# Patient Record
Sex: Male | Born: 1945 | Race: White | Hispanic: No | Smoking: Never smoker
Health system: Southern US, Community
[De-identification: ages and names within clinical notes are randomized; demographics above are authoritative.]

## PROBLEM LIST (undated history)

## (undated) DIAGNOSIS — I10 Essential (primary) hypertension: Secondary | ICD-10-CM

## (undated) DIAGNOSIS — I2510 Atherosclerotic heart disease of native coronary artery without angina pectoris: Secondary | ICD-10-CM

## (undated) DIAGNOSIS — M109 Gout, unspecified: Secondary | ICD-10-CM

## (undated) DIAGNOSIS — G629 Polyneuropathy, unspecified: Secondary | ICD-10-CM

## (undated) DIAGNOSIS — E785 Hyperlipidemia, unspecified: Secondary | ICD-10-CM

## (undated) HISTORY — PX: ABDOMINAL SURGERY: SHX537

## (undated) HISTORY — PX: CORONARY ARTERY BYPASS GRAFT: SHX141

## (undated) HISTORY — PX: AORTIC VALVE REPAIR: SHX6306

## (undated) HISTORY — PX: CARDIAC CATHETERIZATION: SHX172

---

## 2002-09-05 ENCOUNTER — Emergency Department (HOSPITAL_COMMUNITY): Admission: EM | Admit: 2002-09-05 | Discharge: 2002-09-05 | Payer: Self-pay | Admitting: Internal Medicine

## 2002-09-05 ENCOUNTER — Encounter: Payer: Self-pay | Admitting: Internal Medicine

## 2003-07-24 ENCOUNTER — Emergency Department (HOSPITAL_COMMUNITY): Admission: EM | Admit: 2003-07-24 | Discharge: 2003-07-24 | Payer: Self-pay | Admitting: Internal Medicine

## 2005-01-24 ENCOUNTER — Emergency Department (HOSPITAL_COMMUNITY): Admission: EM | Admit: 2005-01-24 | Discharge: 2005-01-24 | Payer: Self-pay | Admitting: Family Medicine

## 2005-07-25 ENCOUNTER — Encounter: Admission: RE | Admit: 2005-07-25 | Discharge: 2005-10-23 | Payer: Self-pay | Admitting: Orthopedic Surgery

## 2005-08-29 ENCOUNTER — Inpatient Hospital Stay (HOSPITAL_COMMUNITY): Admission: RE | Admit: 2005-08-29 | Discharge: 2005-09-04 | Payer: Self-pay | Admitting: Psychiatry

## 2005-08-30 ENCOUNTER — Ambulatory Visit: Payer: Self-pay | Admitting: Psychiatry

## 2005-09-03 ENCOUNTER — Encounter: Payer: Self-pay | Admitting: *Deleted

## 2005-09-04 ENCOUNTER — Inpatient Hospital Stay (HOSPITAL_COMMUNITY): Admission: EM | Admit: 2005-09-04 | Discharge: 2005-09-20 | Payer: Self-pay | Admitting: Emergency Medicine

## 2005-09-04 ENCOUNTER — Ambulatory Visit: Payer: Self-pay | Admitting: Physical Medicine & Rehabilitation

## 2012-12-19 ENCOUNTER — Encounter (HOSPITAL_COMMUNITY): Payer: Self-pay

## 2012-12-19 ENCOUNTER — Encounter (HOSPITAL_COMMUNITY)
Admission: RE | Admit: 2012-12-19 | Discharge: 2012-12-19 | Disposition: A | Discharge status: Home / Self Care | Payer: Medicare Other | Source: Ambulatory Visit | Attending: *Deleted | Admitting: *Deleted

## 2012-12-19 VITALS — BP 84/60 | HR 85 | Ht 72.0 in | Wt 153.8 lb

## 2012-12-19 DIAGNOSIS — Z954 Presence of other heart-valve replacement: Secondary | ICD-10-CM

## 2012-12-19 DIAGNOSIS — I2510 Atherosclerotic heart disease of native coronary artery without angina pectoris: Secondary | ICD-10-CM

## 2012-12-19 HISTORY — DX: Hyperlipidemia, unspecified: E78.5

## 2012-12-19 HISTORY — DX: Atherosclerotic heart disease of native coronary artery without angina pectoris: I25.10

## 2012-12-19 NOTE — Patient Instructions (Addendum)
Pt has finished orientation and is scheduled to start CR on 12/30/12 at 11 am. Pt has been instructed to arrive to class 15 minutes early for scheduled class. Pt has been instructed to wear comfortable clothing and shoes with rubber soles. Pt has been told to take their medications 1 hour prior to coming to class.  If the patient is not going to attend class, he/she has been instructed to call.

## 2012-12-19 NOTE — Progress Notes (Signed)
Patient was referred by Dr. Lynetta Mare post CABGx3 414.01 and Valve Repair V42.2 on 11/27/10. During orientation advised patient on arrival and appointment times what to wear, what to do before, during and after exercise. Reviewed attendance and class policy. Talked about inclement weather and class consultation policy. Pt is scheduled to start Cardiac Rehab on _4/7/14 at 11 am. Pt was advised to come to class 5 minutes before class starts. He was also given instructions on meeting with the dietician and attending the Family Structure classes. Pt is eager to get started. Patient was able to do the 6 minute pre walk test.

## 2012-12-30 ENCOUNTER — Encounter (HOSPITAL_COMMUNITY)
Admission: RE | Admit: 2012-12-30 | Discharge: 2012-12-30 | Disposition: A | Discharge status: Home / Self Care | Payer: Medicare Other | Source: Ambulatory Visit | Attending: Cardiovascular Disease | Admitting: Cardiovascular Disease

## 2012-12-30 DIAGNOSIS — Z951 Presence of aortocoronary bypass graft: Secondary | ICD-10-CM | POA: Insufficient documentation

## 2012-12-30 DIAGNOSIS — Z5189 Encounter for other specified aftercare: Secondary | ICD-10-CM | POA: Insufficient documentation

## 2012-12-30 DIAGNOSIS — I2510 Atherosclerotic heart disease of native coronary artery without angina pectoris: Secondary | ICD-10-CM | POA: Insufficient documentation

## 2012-12-30 DIAGNOSIS — E785 Hyperlipidemia, unspecified: Secondary | ICD-10-CM | POA: Insufficient documentation

## 2013-01-01 ENCOUNTER — Encounter (HOSPITAL_COMMUNITY)
Admission: RE | Admit: 2013-01-01 | Discharge: 2013-01-01 | Disposition: A | Discharge status: Home / Self Care | Payer: Medicare Other | Source: Ambulatory Visit | Attending: *Deleted | Admitting: *Deleted

## 2013-01-03 ENCOUNTER — Encounter (HOSPITAL_COMMUNITY)
Admission: RE | Admit: 2013-01-03 | Discharge: 2013-01-03 | Disposition: A | Discharge status: Home / Self Care | Payer: Medicare Other | Source: Ambulatory Visit | Attending: *Deleted | Admitting: *Deleted

## 2013-01-06 ENCOUNTER — Encounter (HOSPITAL_COMMUNITY)
Admission: RE | Admit: 2013-01-06 | Discharge: 2013-01-06 | Disposition: A | Discharge status: Home / Self Care | Payer: Medicare Other | Source: Ambulatory Visit | Attending: *Deleted | Admitting: *Deleted

## 2013-01-08 ENCOUNTER — Encounter (HOSPITAL_COMMUNITY)
Admission: RE | Admit: 2013-01-08 | Discharge: 2013-01-08 | Disposition: A | Discharge status: Home / Self Care | Payer: Medicare Other | Source: Ambulatory Visit | Attending: *Deleted | Admitting: *Deleted

## 2013-01-08 ENCOUNTER — Ambulatory Visit: Payer: Self-pay | Admitting: Podiatry

## 2013-01-10 ENCOUNTER — Encounter (HOSPITAL_COMMUNITY)
Admission: RE | Admit: 2013-01-10 | Discharge: 2013-01-10 | Disposition: A | Discharge status: Home / Self Care | Payer: Medicare Other | Source: Ambulatory Visit | Attending: *Deleted | Admitting: *Deleted

## 2013-01-13 ENCOUNTER — Encounter (HOSPITAL_COMMUNITY)
Admission: RE | Admit: 2013-01-13 | Discharge: 2013-01-13 | Disposition: A | Discharge status: Home / Self Care | Payer: Medicare Other | Source: Ambulatory Visit | Attending: *Deleted | Admitting: *Deleted

## 2013-01-15 ENCOUNTER — Encounter (HOSPITAL_COMMUNITY)
Admission: RE | Admit: 2013-01-15 | Discharge: 2013-01-15 | Disposition: A | Discharge status: Home / Self Care | Payer: Medicare Other | Source: Ambulatory Visit | Attending: *Deleted | Admitting: *Deleted

## 2013-01-17 ENCOUNTER — Encounter (HOSPITAL_COMMUNITY)
Admission: RE | Admit: 2013-01-17 | Discharge: 2013-01-17 | Disposition: A | Discharge status: Home / Self Care | Payer: Medicare Other | Source: Ambulatory Visit | Attending: *Deleted | Admitting: *Deleted

## 2013-01-20 ENCOUNTER — Encounter (HOSPITAL_COMMUNITY)
Admission: RE | Admit: 2013-01-20 | Discharge: 2013-01-20 | Disposition: A | Discharge status: Home / Self Care | Payer: Medicare Other | Source: Ambulatory Visit | Attending: *Deleted | Admitting: *Deleted

## 2013-01-22 ENCOUNTER — Encounter (HOSPITAL_COMMUNITY): Payer: Medicare Other

## 2013-01-24 ENCOUNTER — Encounter (HOSPITAL_COMMUNITY)
Admission: RE | Admit: 2013-01-24 | Discharge: 2013-01-24 | Disposition: A | Discharge status: Home / Self Care | Payer: Medicare Other | Source: Ambulatory Visit | Attending: Cardiovascular Disease | Admitting: Cardiovascular Disease

## 2013-01-24 DIAGNOSIS — Z5189 Encounter for other specified aftercare: Secondary | ICD-10-CM | POA: Insufficient documentation

## 2013-01-24 DIAGNOSIS — E785 Hyperlipidemia, unspecified: Secondary | ICD-10-CM | POA: Insufficient documentation

## 2013-01-24 DIAGNOSIS — I2510 Atherosclerotic heart disease of native coronary artery without angina pectoris: Secondary | ICD-10-CM | POA: Insufficient documentation

## 2013-01-24 DIAGNOSIS — Z951 Presence of aortocoronary bypass graft: Secondary | ICD-10-CM | POA: Insufficient documentation

## 2013-01-27 ENCOUNTER — Encounter (HOSPITAL_COMMUNITY)
Admission: RE | Admit: 2013-01-27 | Discharge: 2013-01-27 | Disposition: A | Discharge status: Home / Self Care | Payer: Medicare Other | Source: Ambulatory Visit | Attending: *Deleted | Admitting: *Deleted

## 2013-01-28 ENCOUNTER — Ambulatory Visit (INDEPENDENT_AMBULATORY_CARE_PROVIDER_SITE_OTHER): Payer: Medicare Other | Admitting: Podiatry

## 2013-01-28 ENCOUNTER — Encounter: Payer: Self-pay | Admitting: Podiatry

## 2013-01-28 VITALS — BP 127/69 | HR 66 | Ht 70.0 in | Wt 153.0 lb

## 2013-01-28 DIAGNOSIS — B351 Tinea unguium: Secondary | ICD-10-CM

## 2013-01-28 DIAGNOSIS — M25579 Pain in unspecified ankle and joints of unspecified foot: Secondary | ICD-10-CM | POA: Insufficient documentation

## 2013-01-28 NOTE — Progress Notes (Signed)
Subjective: 67 y.o. year old male patient presents complaining of painful nails and 3rd toe right from corn.  Patient requests toe nails, corns and calluses trimmed. Stated that he has had triple bypass in March 2014.   Review of Systems - General ROS: negative for - chills, fatigue, fever, hot flashes, night sweats, sleep disturbance, weight gain or weight loss Ophthalmic ROS: negative ENT ROS: negative Allergy and Immunology ROS: negative Hematological and Lymphatic ROS: negative Respiratory ROS: no cough, shortness of breath, or wheezing Cardiovascular ROS: Had open heart surgery 11/23/12, tripple bypass. Gastrointestinal ROS: no abdominal pain, change in bowel habits, or black or bloody stools Genito-Urinary ROS: no dysuria, trouble voiding, or hematuria Musculoskeletal ROS: negative  Objective: Dermatologic: Thick yellow deformed nails x 10.  Distal clavi 3rd right painful. Vascular: Pedal pulses are all palpable. Orthopedic: Contracted lesser digits 3rd bilateral. Neurologic: Decreased sensory perception on Monofilament testing. All other epicritic and tactile sensations grossly intact including Vibratory sensations.  Assessment: Dystrophic mycotic nails x 10. Digital corn 3rd right.  Treatment: All mycotic nails, corns, calluses debrided.  Return in 3 months or as needed.

## 2013-01-29 ENCOUNTER — Encounter (HOSPITAL_COMMUNITY)
Admission: RE | Admit: 2013-01-29 | Discharge: 2013-01-29 | Disposition: A | Discharge status: Home / Self Care | Payer: Medicare Other | Source: Ambulatory Visit | Attending: *Deleted | Admitting: *Deleted

## 2013-01-31 ENCOUNTER — Encounter (HOSPITAL_COMMUNITY)
Admission: RE | Admit: 2013-01-31 | Discharge: 2013-01-31 | Disposition: A | Discharge status: Home / Self Care | Payer: Medicare Other | Source: Ambulatory Visit | Attending: *Deleted | Admitting: *Deleted

## 2013-02-03 ENCOUNTER — Encounter (HOSPITAL_COMMUNITY)
Admission: RE | Admit: 2013-02-03 | Discharge: 2013-02-03 | Disposition: A | Discharge status: Home / Self Care | Payer: Medicare Other | Source: Ambulatory Visit | Attending: *Deleted | Admitting: *Deleted

## 2013-02-05 ENCOUNTER — Encounter (HOSPITAL_COMMUNITY)
Admission: RE | Admit: 2013-02-05 | Discharge: 2013-02-05 | Disposition: A | Discharge status: Home / Self Care | Payer: Medicare Other | Source: Ambulatory Visit | Attending: *Deleted | Admitting: *Deleted

## 2013-02-07 ENCOUNTER — Encounter (HOSPITAL_COMMUNITY)
Admission: RE | Admit: 2013-02-07 | Discharge: 2013-02-07 | Disposition: A | Discharge status: Home / Self Care | Payer: Medicare Other | Source: Ambulatory Visit | Attending: *Deleted | Admitting: *Deleted

## 2013-02-10 ENCOUNTER — Encounter (HOSPITAL_COMMUNITY)
Admission: RE | Admit: 2013-02-10 | Discharge: 2013-02-10 | Disposition: A | Discharge status: Home / Self Care | Payer: Medicare Other | Source: Ambulatory Visit | Attending: *Deleted | Admitting: *Deleted

## 2013-02-12 ENCOUNTER — Encounter (HOSPITAL_COMMUNITY)
Admission: RE | Admit: 2013-02-12 | Discharge: 2013-02-12 | Disposition: A | Discharge status: Home / Self Care | Payer: Medicare Other | Source: Ambulatory Visit | Attending: *Deleted | Admitting: *Deleted

## 2013-02-14 ENCOUNTER — Encounter (HOSPITAL_COMMUNITY)
Admission: RE | Admit: 2013-02-14 | Discharge: 2013-02-14 | Disposition: A | Discharge status: Home / Self Care | Payer: Medicare Other | Source: Ambulatory Visit | Attending: *Deleted | Admitting: *Deleted

## 2013-02-17 ENCOUNTER — Encounter (HOSPITAL_COMMUNITY): Payer: Medicare Other

## 2013-02-19 ENCOUNTER — Encounter (HOSPITAL_COMMUNITY)
Admission: RE | Admit: 2013-02-19 | Discharge: 2013-02-19 | Disposition: A | Discharge status: Home / Self Care | Payer: Medicare Other | Source: Ambulatory Visit | Attending: *Deleted | Admitting: *Deleted

## 2013-02-21 ENCOUNTER — Encounter (HOSPITAL_COMMUNITY)
Admission: RE | Admit: 2013-02-21 | Discharge: 2013-02-21 | Disposition: A | Discharge status: Home / Self Care | Payer: Medicare Other | Source: Ambulatory Visit | Attending: *Deleted | Admitting: *Deleted

## 2013-02-24 ENCOUNTER — Encounter (HOSPITAL_COMMUNITY)
Admission: RE | Admit: 2013-02-24 | Discharge: 2013-02-24 | Disposition: A | Discharge status: Home / Self Care | Payer: Medicare Other | Source: Ambulatory Visit | Attending: Cardiovascular Disease | Admitting: Cardiovascular Disease

## 2013-02-24 DIAGNOSIS — Z5189 Encounter for other specified aftercare: Secondary | ICD-10-CM | POA: Insufficient documentation

## 2013-02-24 DIAGNOSIS — E785 Hyperlipidemia, unspecified: Secondary | ICD-10-CM | POA: Insufficient documentation

## 2013-02-24 DIAGNOSIS — I2510 Atherosclerotic heart disease of native coronary artery without angina pectoris: Secondary | ICD-10-CM | POA: Insufficient documentation

## 2013-02-24 DIAGNOSIS — Z951 Presence of aortocoronary bypass graft: Secondary | ICD-10-CM | POA: Insufficient documentation

## 2013-02-26 ENCOUNTER — Encounter (HOSPITAL_COMMUNITY)
Admission: RE | Admit: 2013-02-26 | Discharge: 2013-02-26 | Disposition: A | Discharge status: Home / Self Care | Payer: Medicare Other | Source: Ambulatory Visit | Attending: *Deleted | Admitting: *Deleted

## 2013-02-28 ENCOUNTER — Encounter (HOSPITAL_COMMUNITY): Payer: Medicare Other

## 2013-03-03 ENCOUNTER — Encounter (HOSPITAL_COMMUNITY)
Admission: RE | Admit: 2013-03-03 | Discharge: 2013-03-03 | Disposition: A | Discharge status: Home / Self Care | Payer: Medicare Other | Source: Ambulatory Visit | Attending: *Deleted | Admitting: *Deleted

## 2013-03-05 ENCOUNTER — Encounter (HOSPITAL_COMMUNITY)
Admission: RE | Admit: 2013-03-05 | Discharge: 2013-03-05 | Disposition: A | Discharge status: Home / Self Care | Payer: Medicare Other | Source: Ambulatory Visit | Attending: *Deleted | Admitting: *Deleted

## 2013-03-07 ENCOUNTER — Encounter (HOSPITAL_COMMUNITY)
Admission: RE | Admit: 2013-03-07 | Discharge: 2013-03-07 | Disposition: A | Discharge status: Home / Self Care | Payer: Medicare Other | Source: Ambulatory Visit | Attending: *Deleted | Admitting: *Deleted

## 2013-03-10 ENCOUNTER — Encounter (HOSPITAL_COMMUNITY)
Admission: RE | Admit: 2013-03-10 | Discharge: 2013-03-10 | Disposition: A | Discharge status: Home / Self Care | Payer: Medicare Other | Source: Ambulatory Visit | Attending: *Deleted | Admitting: *Deleted

## 2013-03-12 ENCOUNTER — Encounter (HOSPITAL_COMMUNITY)
Admission: RE | Admit: 2013-03-12 | Discharge: 2013-03-12 | Disposition: A | Discharge status: Home / Self Care | Payer: Medicare Other | Source: Ambulatory Visit | Attending: *Deleted | Admitting: *Deleted

## 2013-03-14 ENCOUNTER — Encounter (HOSPITAL_COMMUNITY)
Admission: RE | Admit: 2013-03-14 | Discharge: 2013-03-14 | Disposition: A | Discharge status: Home / Self Care | Payer: Medicare Other | Source: Ambulatory Visit | Attending: *Deleted | Admitting: *Deleted

## 2013-03-17 ENCOUNTER — Encounter (HOSPITAL_COMMUNITY)
Admission: RE | Admit: 2013-03-17 | Discharge: 2013-03-17 | Disposition: A | Discharge status: Home / Self Care | Payer: Medicare Other | Source: Ambulatory Visit | Attending: *Deleted | Admitting: *Deleted

## 2013-03-19 ENCOUNTER — Encounter (HOSPITAL_COMMUNITY)
Admission: RE | Admit: 2013-03-19 | Discharge: 2013-03-19 | Disposition: A | Discharge status: Home / Self Care | Payer: Medicare Other | Source: Ambulatory Visit | Attending: *Deleted | Admitting: *Deleted

## 2013-03-21 ENCOUNTER — Encounter (HOSPITAL_COMMUNITY)
Admission: RE | Admit: 2013-03-21 | Discharge: 2013-03-21 | Disposition: A | Discharge status: Home / Self Care | Payer: Medicare Other | Source: Ambulatory Visit | Attending: *Deleted | Admitting: *Deleted

## 2013-03-24 ENCOUNTER — Encounter (HOSPITAL_COMMUNITY)
Admission: RE | Admit: 2013-03-24 | Discharge: 2013-03-24 | Disposition: A | Discharge status: Home / Self Care | Payer: Medicare Other | Source: Ambulatory Visit | Attending: *Deleted | Admitting: *Deleted

## 2013-03-26 ENCOUNTER — Encounter (HOSPITAL_COMMUNITY)
Admission: RE | Admit: 2013-03-26 | Discharge: 2013-03-26 | Disposition: A | Discharge status: Home / Self Care | Payer: Medicare Other | Source: Ambulatory Visit | Attending: Cardiovascular Disease | Admitting: Cardiovascular Disease

## 2013-03-26 DIAGNOSIS — I2510 Atherosclerotic heart disease of native coronary artery without angina pectoris: Secondary | ICD-10-CM | POA: Insufficient documentation

## 2013-03-26 DIAGNOSIS — Z951 Presence of aortocoronary bypass graft: Secondary | ICD-10-CM | POA: Insufficient documentation

## 2013-03-26 DIAGNOSIS — Z5189 Encounter for other specified aftercare: Secondary | ICD-10-CM | POA: Insufficient documentation

## 2013-03-31 ENCOUNTER — Encounter (HOSPITAL_COMMUNITY)
Admission: RE | Admit: 2013-03-31 | Discharge: 2013-03-31 | Disposition: A | Discharge status: Home / Self Care | Payer: Medicare Other | Source: Ambulatory Visit | Attending: Cardiovascular Disease | Admitting: Cardiovascular Disease

## 2013-04-30 ENCOUNTER — Encounter: Payer: Self-pay | Admitting: Podiatry

## 2013-04-30 ENCOUNTER — Ambulatory Visit (INDEPENDENT_AMBULATORY_CARE_PROVIDER_SITE_OTHER): Payer: Medicare Other | Admitting: Podiatry

## 2013-04-30 VITALS — BP 114/71 | HR 55

## 2013-04-30 DIAGNOSIS — B351 Tinea unguium: Secondary | ICD-10-CM

## 2013-04-30 DIAGNOSIS — M25571 Pain in right ankle and joints of right foot: Secondary | ICD-10-CM

## 2013-04-30 DIAGNOSIS — M25579 Pain in unspecified ankle and joints of unspecified foot: Secondary | ICD-10-CM

## 2013-04-30 NOTE — Progress Notes (Signed)
Subjective:  67 y.o. year old male patient presents complaining of painful toe on 2nd digit right foot with a corn.   Patient requests toe nails, corns and calluses trimmed.  Objective: Dermatologic: Thick yellow deformed nails x 10.  Distal clavi 3r2nd right painful.  Vascular: Pedal pulses are all palpable.  Orthopedic: Contracted lesser digits 3rd bilateral.  Neurologic: Decreased sensory perception on Monofilament testing.  All other epicritic and tactile sensations grossly intact including Vibratory sensations.  Assessment:  Dystrophic mycotic nails x 10.  Painful digital corn 2nd right.  Treatment: All mycotic nails, corns, calluses debrided.  Return in 3 months or as needed

## 2013-08-01 ENCOUNTER — Encounter: Payer: Self-pay | Admitting: Podiatry

## 2013-08-01 ENCOUNTER — Ambulatory Visit (INDEPENDENT_AMBULATORY_CARE_PROVIDER_SITE_OTHER): Payer: Medicare Other | Admitting: Podiatry

## 2013-08-01 VITALS — BP 115/74 | HR 59 | Ht 68.0 in | Wt 150.0 lb

## 2013-08-01 DIAGNOSIS — M25579 Pain in unspecified ankle and joints of unspecified foot: Secondary | ICD-10-CM

## 2013-08-01 DIAGNOSIS — M2040 Other hammer toe(s) (acquired), unspecified foot: Secondary | ICD-10-CM | POA: Insufficient documentation

## 2013-08-01 DIAGNOSIS — B351 Tinea unguium: Secondary | ICD-10-CM

## 2013-08-01 NOTE — Progress Notes (Signed)
Subjective:  67 y.o. year old male patient presents complaining of painful toe on 2nd digit right foot with a corn.    Objective: Dermatologic: Thick yellow deformed nails x 10.  Distal clavi 2nd right painful.  Vascular: Pedal pulses are all palpable.  Orthopedic: Contracted lesser digits 3rd bilateral.  Neurologic: Decreased sensory perception on Monofilament testing.  All other epicritic and tactile sensations grossly intact including Vibratory sensations.   Assessment:  Dystrophic mycotic nails x 10.  Painful digital corn 2nd right.   Treatment: All mycotic nails, corns, calluses debrided.  Return in 3 months or as needed

## 2013-08-01 NOTE — Patient Instructions (Signed)
Seen for painful toe 2nd left. Has corn at distal end. All nails and corns debrided. Return as needed.

## 2013-08-05 NOTE — Addendum Note (Signed)
Encounter addended by: Angelica Pou, RN on: 08/05/2013  1:21 PM<BR>     Documentation filed: Notes Section

## 2013-08-05 NOTE — Addendum Note (Signed)
Encounter addended by: Angelica Pou, RN on: 08/05/2013 12:05 PM<BR>     Documentation filed: Notes Section

## 2013-08-05 NOTE — Progress Notes (Signed)
Cardiac Rehabilitation Program Outcomes Report   Orientation:  12/19/2012 Graduate Date:  07/072014 Discharge Date:  07/072014 # of sessions completed: 34 DX: CABG X 3 and Valve repair  Cardiologist: Cleavenger Family MD:  Saunders Revel Time:  11:00  A.  Exercise Program:  Tolerates exercise @ 3.73 METS for 15 minutes and Walk Test Results:  Post: Post Walk Test: Resting HR 66, BP 118/58, O2 97%, RPE 6, and RPD 6. 6 min HR 87, BP 130/70, O2 97% ,RPE 11 and RPD 7. POst HR 78, BP 108/62, O2 98%, RPE 6 and RPD 6. walked 1071ft at 2.0 mph at 2.8 METS.  B.  Mental Health:  Good mental attitude  C.  Education/Instruction/Skills  Accurately checks own pulse.  Rest:  66  Exercise: 119, Knows THR for exercise, Uses Perceived Exertion Scale and/or Dyspnea Scale and Attended 13 education classes  Uses Perceived Exertion Scale and/or Dyspnea Scale  D.  Nutrition/Weight Control/Body Composition:  Adherence to prescribed nutrition program: good    E.  Blood Lipids    No results found for this basename: CHOL, HDL, LDLCALC, LDLDIRECT, TRIG, CHOLHDL    F.  Lifestyle Changes:  Making positive lifestyle changes  G.  Symptoms noted with exercise:  Asymptomatic  Report Completed By:  Lelon Huh. Niah Heinle RN   Comments: This is patients Graduation's report. He achieved a peak METS of 3.73. His resting HR was 66 and BP of 118/58. His peak HR was 119 and BP was 150/68. Will contact patient at 59month, 6 months and 1 year to see if they are compliant with a exercise regimen.

## 2013-08-05 NOTE — Addendum Note (Signed)
Encounter addended by: Angelica Pou, RN on: 08/05/2013 12:06 PM<BR>     Documentation filed: Clinical Notes

## 2013-08-05 NOTE — Addendum Note (Signed)
Encounter addended by: Angelica Pou, RN on: 08/05/2013  1:22 PM<BR>     Documentation filed: Clinical Notes

## 2013-08-05 NOTE — Progress Notes (Signed)
Cardiac Rehabilitation Program Outcomes Report   Orientation:  12/19/2012 Halfway report 02/10/2013 Graduate Date:  tbd Discharge Date:  tbd # of sessions completed: 18 DX: CABG X 3/ Valve repair  Cardiologist: Molly Maduro Family MD:  Virgina Organ.ayyaz Class Time:  11:00  A.  Exercise Program:  Tolerates exercise @ 3.00 METS for 15 minutes  B.  Mental Health:  Good mental attitude and Quality of Life (QOL)  improvements:  Overall  20.15 %, Health/Functioning 17.33 %, Socioeconomics 21.29 %, Psych/Spiritual 22.29 %, Family 24.00 %    C.  Education/Instruction/Skills  Accurately checks own pulse.  Rest:  58  Exercise:  116, Knows THR for exercise and Uses Perceived Exertion Scale and/or Dyspnea Scale  Uses Perceived Exertion Scale and/or Dyspnea Scale  D.  Nutrition/Weight Control/Body Composition:  Adherence to prescribed nutrition program: good    E.  Blood Lipids    No results found for this basename: CHOL, HDL, LDLCALC, LDLDIRECT, TRIG, CHOLHDL    F.  Lifestyle Changes:  Making positive lifestyle changes  G.  Symptoms noted with exercise:  Asymptomatic  Report Completed By:  Lelon Huh. Dragon Thrush RN   Comments:  This is patients halfway report. He achieved a peak METS of 3.00. His resting HR was 53 and resting BP was 90/48 and his peak HR was 116 and Peak BP was 140/62. He has done well with rehab so far. A graduation report will follow upon his 36 visit.

## 2013-08-05 NOTE — Addendum Note (Signed)
Encounter addended by: Angelica Pou, RN on: 08/05/2013 12:19 PM<BR>     Documentation filed: Clinical Notes

## 2013-08-05 NOTE — Progress Notes (Signed)
Cardiac Rehabilitation Program Outcomes Report   Orientation:  12/19/2012 1st week report: 01/03/2013 Graduate Date:  tbd Discharge Date:  tbd # of sessions completed: 3 DX CABG X3/ Valve repair  Cardiologist: Cleavenger Family MD:  Saunders Revel Time:  11:00  A.  Exercise Program:  Tolerates exercise @ 3.72 METS for 15 minutes and Walk Test Results:  Pre: Pre walk Test: Resting Data: HR 71, BP 82/62, O2 96%, RPE,6, and RPD 6, 6 min HR 78, BP 102/60, O2 99%, RPE 9, and RPD 8, Post HR 67, BP 92/60, O2 98%, RPE 6, and RPD 6. Walked 850 ft at 1.6 mph at 2.2 METS.  B.  Mental Health:  Good mental attitude and Quality of Life (QOL)  improvements:  Overall  20.15 %, Health/Functioning 17.33 %, Socioeconomics 21.29 %, Psych/Spiritual 22.29 %, Family 24.00 %    C.  Education/Instruction/Skills  Accurately checks own pulse.  Rest:  80  Exercise:  100, Knows THR for exercise and Uses Perceived Exertion Scale and/or Dyspnea Scale  Uses Perceived Exertion Scale and/or Dyspnea Scale  D.  Nutrition/Weight Control/Body Composition:  Adherence to prescribed nutrition program: good    E.  Blood Lipids    No results found for this basename: CHOL, HDL, LDLCALC, LDLDIRECT, TRIG, CHOLHDL    F.  Lifestyle Changes:  Making positive lifestyle changes  G.  Symptoms noted with exercise:  Asymptomatic  Report Completed By:  Lelon Huh. Kammie Scioli RN   Comments:  This is patients 1st week report. He achieved a peak METS of 3.72. His resting HR was 80 and resting BP was 98/60. His peak HR was 100 and BP peak was 120/70.A report will follow upon the patients 18 th visit Halfway point.

## 2013-08-05 NOTE — Addendum Note (Signed)
Encounter addended by: Angelica Pou, RN on: 08/05/2013 12:18 PM<BR>     Documentation filed: Notes Section

## 2013-11-03 ENCOUNTER — Ambulatory Visit: Payer: Medicare Other | Admitting: Podiatry

## 2013-11-11 ENCOUNTER — Ambulatory Visit: Payer: Medicare Other | Admitting: Podiatry

## 2013-11-14 ENCOUNTER — Encounter: Payer: Self-pay | Admitting: Podiatry

## 2013-11-14 ENCOUNTER — Ambulatory Visit (INDEPENDENT_AMBULATORY_CARE_PROVIDER_SITE_OTHER): Payer: Medicare Other | Admitting: Podiatry

## 2013-11-14 VITALS — BP 137/71 | HR 55 | Ht 68.0 in | Wt 158.0 lb

## 2013-11-14 DIAGNOSIS — M2040 Other hammer toe(s) (acquired), unspecified foot: Secondary | ICD-10-CM

## 2013-11-14 DIAGNOSIS — M79606 Pain in leg, unspecified: Secondary | ICD-10-CM

## 2013-11-14 DIAGNOSIS — B351 Tinea unguium: Secondary | ICD-10-CM

## 2013-11-14 DIAGNOSIS — M79609 Pain in unspecified limb: Secondary | ICD-10-CM

## 2013-11-14 NOTE — Progress Notes (Signed)
Subjective:  68 y.o. year old male patient presents complaining of pain on right foot and toe nails on both feet.   Objective: Dermatologic: Thick yellow deformed nails x 10.  Distal clavi 2nd and 4th digits right painful.  No visible lesions on plantar surface where patient indicated for pain on right foot.  Vascular: Pedal pulses are all palpable.  Orthopedic: Contracted lesser digits 3rd bilateral and 4th right. Neurologic: Decreased sensory perception on Monofilament testing.  All other epicritic and tactile sensations grossly intact including Vibratory sensations.   Assessment:  Dystrophic mycotic nails x 10.  Painful digital corn 2nd and 4th digits right.   Treatment: All mycotic nails, corns, calluses debrided.  Return in 3 months or as needed

## 2013-11-14 NOTE — Patient Instructions (Signed)
Seen for hypertrophic nails. All nails debrided. Return in 3 months or as needed.  

## 2014-01-16 ENCOUNTER — Ambulatory Visit (INDEPENDENT_AMBULATORY_CARE_PROVIDER_SITE_OTHER): Payer: Medicare Other | Admitting: Podiatry

## 2014-01-16 ENCOUNTER — Encounter: Payer: Self-pay | Admitting: Podiatry

## 2014-01-16 VITALS — BP 133/75 | HR 67

## 2014-01-16 DIAGNOSIS — M109 Gout, unspecified: Secondary | ICD-10-CM | POA: Insufficient documentation

## 2014-01-16 DIAGNOSIS — M79606 Pain in leg, unspecified: Secondary | ICD-10-CM

## 2014-01-16 DIAGNOSIS — R609 Edema, unspecified: Secondary | ICD-10-CM

## 2014-01-16 DIAGNOSIS — M79609 Pain in unspecified limb: Secondary | ICD-10-CM

## 2014-01-16 MED ORDER — HYDROCODONE-ACETAMINOPHEN 10-325 MG PO TABS: 1.0000 | ORAL_TABLET | Freq: Four times a day (QID) | ORAL | Status: DC | PRN

## 2014-01-16 MED ORDER — INDOMETHACIN 50 MG PO CAPS: 50.0000 mg | ORAL_CAPSULE | Freq: Three times a day (TID) | ORAL | Status: DC

## 2014-01-16 NOTE — Patient Instructions (Signed)
Seen for pain and swelling on right foot. Possible Gout. Take medication as prescribed and return in one week or sooner if pain and swelling, or redness increases.

## 2014-01-16 NOTE — Addendum Note (Signed)
Addended by: Charlett NoseSHEARD, Datra Clary O on: 01/16/2014 02:29 PM   Modules accepted: Orders

## 2014-01-16 NOTE — Progress Notes (Signed)
Subjective: 68 year old male presents accompanied by his wife with pain in right foot. It has gotten swollen, warm, and painful to walk since yesterday.  Patient denies any injury or trauma associated with the pain. Stated that he has done drinking beer prior to this incident.   Objective: Warm increased temperature through out the right foot.  A few areas of mild erythematous joints at the first MPJ and some at mid foot joints.  Positive of mild forefoot edema noted. Hypertrophic and dystrophic nails x 10. All pedal pulses are all palpable.  Radiographic examination of the right foot reveal narrowed joint space at the first MPJ, severely displaced first ray to dorsal direction. No acute change in osseous or articular spaces. No broken bone or soft tissue mass noted.   Assessment: Rule out Gouty arthritis right foot.  Pain in right lower limb. Mycotic nails x 10.  Plan: Reviewed findings. Rx Indocin 50 mg q 8 hr till pain subside and Hydrocodone as needed for pain.

## 2014-01-21 ENCOUNTER — Ambulatory Visit: Payer: Medicare Other | Admitting: Podiatry

## 2014-01-23 ENCOUNTER — Ambulatory Visit (INDEPENDENT_AMBULATORY_CARE_PROVIDER_SITE_OTHER): Payer: Medicare Other | Admitting: Podiatry

## 2014-01-23 ENCOUNTER — Encounter: Payer: Self-pay | Admitting: Podiatry

## 2014-01-23 VITALS — BP 175/81 | HR 52

## 2014-01-23 DIAGNOSIS — M79609 Pain in unspecified limb: Secondary | ICD-10-CM

## 2014-01-23 DIAGNOSIS — M109 Gout, unspecified: Secondary | ICD-10-CM

## 2014-01-23 DIAGNOSIS — M79606 Pain in leg, unspecified: Secondary | ICD-10-CM

## 2014-01-23 NOTE — Progress Notes (Signed)
Follow up on gouty arthritis right foot. Stated that the after taking medication he was able to walk without discomfort the next day.  Now only hurts from corn on 4th toe right.   Objective: No edema or erythema on right.  No discomfort to walk except pain from a corn on 4th digit right.   Assessment: Improved Gouty arthropathy right foot. Painful corn 4th right with contracted digits.  Plan: Debrided painful corn. Return as needed. Discontinue Indocin tomorrow.

## 2014-01-23 NOTE — Patient Instructions (Signed)
Follow up on Gouty arthritis right foot. Medication has helped. Discontinue medication tomorrow.  Trimmed painful corn on 4th digit right. [facility].

## 2014-02-11 ENCOUNTER — Ambulatory Visit: Payer: Medicare Other | Admitting: Podiatry

## 2014-02-17 ENCOUNTER — Encounter: Payer: Self-pay | Admitting: Podiatry

## 2014-02-17 ENCOUNTER — Ambulatory Visit (INDEPENDENT_AMBULATORY_CARE_PROVIDER_SITE_OTHER): Payer: Medicare Other | Admitting: Podiatry

## 2014-02-17 VITALS — BP 128/67 | HR 63

## 2014-02-17 DIAGNOSIS — M79609 Pain in unspecified limb: Secondary | ICD-10-CM

## 2014-02-17 DIAGNOSIS — M2040 Other hammer toe(s) (acquired), unspecified foot: Secondary | ICD-10-CM

## 2014-02-17 DIAGNOSIS — B351 Tinea unguium: Secondary | ICD-10-CM

## 2014-02-17 DIAGNOSIS — M79606 Pain in leg, unspecified: Secondary | ICD-10-CM

## 2014-02-17 NOTE — Progress Notes (Signed)
Subjective:  68 y.o. year old male patient presents complaining of painful toe on 4th digit right foot with a corn at distal tip.   Patient requests toe nails, corns and calluses trimmed.  Objective: Dermatologic: Thick yellow deformed nails x 10.  Distal clavi 4th right painful.  Vascular: Pedal pulses are all palpable.  Orthopedic: Contracted lesser digits 3rd bilateral.  Neurologic: Decreased sensory perception on Monofilament testing.  All other epicritic and tactile sensations grossly intact including Vibratory sensations.   Assessment:  Dystrophic mycotic nails x 10.  Painful digital corn 4th right.   Treatment: All mycotic nails, corns, calluses debrided.  Return in 3 months or as needed

## 2014-02-17 NOTE — Patient Instructions (Signed)
Seen for painful corns and toe nails. All debrided. Return as needed.

## 2014-12-21 ENCOUNTER — Other Ambulatory Visit: Payer: Self-pay | Admitting: Podiatry

## 2017-06-21 ENCOUNTER — Encounter (INDEPENDENT_AMBULATORY_CARE_PROVIDER_SITE_OTHER): Payer: Self-pay | Admitting: Internal Medicine

## 2017-06-21 ENCOUNTER — Encounter (INDEPENDENT_AMBULATORY_CARE_PROVIDER_SITE_OTHER): Payer: Self-pay

## 2017-07-15 ENCOUNTER — Encounter (HOSPITAL_COMMUNITY): Payer: Self-pay

## 2017-07-15 ENCOUNTER — Inpatient Hospital Stay (HOSPITAL_COMMUNITY)
Admission: EM | Admit: 2017-07-15 | Discharge: 2017-07-25 | DRG: 682 | Disposition: A | Discharge status: Skilled Nursing Facility | Payer: Medicare Other | Attending: Internal Medicine | Admitting: Internal Medicine

## 2017-07-15 ENCOUNTER — Emergency Department (HOSPITAL_COMMUNITY): Payer: Medicare Other

## 2017-07-15 DIAGNOSIS — D649 Anemia, unspecified: Secondary | ICD-10-CM | POA: Diagnosis not present

## 2017-07-15 DIAGNOSIS — T502X5A Adverse effect of carbonic-anhydrase inhibitors, benzothiadiazides and other diuretics, initial encounter: Secondary | ICD-10-CM | POA: Diagnosis present

## 2017-07-15 DIAGNOSIS — I10 Essential (primary) hypertension: Secondary | ICD-10-CM

## 2017-07-15 DIAGNOSIS — L97919 Non-pressure chronic ulcer of unspecified part of right lower leg with unspecified severity: Secondary | ICD-10-CM | POA: Diagnosis present

## 2017-07-15 DIAGNOSIS — D6489 Other specified anemias: Secondary | ICD-10-CM | POA: Diagnosis present

## 2017-07-15 DIAGNOSIS — G629 Polyneuropathy, unspecified: Secondary | ICD-10-CM | POA: Diagnosis present

## 2017-07-15 DIAGNOSIS — Z951 Presence of aortocoronary bypass graft: Secondary | ICD-10-CM

## 2017-07-15 DIAGNOSIS — N179 Acute kidney failure, unspecified: Secondary | ICD-10-CM

## 2017-07-15 DIAGNOSIS — K7030 Alcoholic cirrhosis of liver without ascites: Secondary | ICD-10-CM | POA: Diagnosis present

## 2017-07-15 DIAGNOSIS — L97929 Non-pressure chronic ulcer of unspecified part of left lower leg with unspecified severity: Secondary | ICD-10-CM

## 2017-07-15 DIAGNOSIS — N170 Acute kidney failure with tubular necrosis: Principal | ICD-10-CM | POA: Diagnosis present

## 2017-07-15 DIAGNOSIS — G9340 Encephalopathy, unspecified: Secondary | ICD-10-CM

## 2017-07-15 DIAGNOSIS — D62 Acute posthemorrhagic anemia: Secondary | ICD-10-CM | POA: Diagnosis not present

## 2017-07-15 DIAGNOSIS — K651 Peritoneal abscess: Secondary | ICD-10-CM | POA: Diagnosis present

## 2017-07-15 DIAGNOSIS — I5022 Chronic systolic (congestive) heart failure: Secondary | ICD-10-CM | POA: Diagnosis present

## 2017-07-15 DIAGNOSIS — I739 Peripheral vascular disease, unspecified: Secondary | ICD-10-CM | POA: Diagnosis present

## 2017-07-15 DIAGNOSIS — Z720 Tobacco use: Secondary | ICD-10-CM

## 2017-07-15 DIAGNOSIS — F1021 Alcohol dependence, in remission: Secondary | ICD-10-CM | POA: Diagnosis not present

## 2017-07-15 DIAGNOSIS — Z7189 Other specified counseling: Secondary | ICD-10-CM | POA: Diagnosis not present

## 2017-07-15 DIAGNOSIS — E43 Unspecified severe protein-calorie malnutrition: Secondary | ICD-10-CM | POA: Diagnosis present

## 2017-07-15 DIAGNOSIS — N281 Cyst of kidney, acquired: Secondary | ICD-10-CM | POA: Diagnosis present

## 2017-07-15 DIAGNOSIS — M109 Gout, unspecified: Secondary | ICD-10-CM | POA: Diagnosis present

## 2017-07-15 DIAGNOSIS — E860 Dehydration: Secondary | ICD-10-CM | POA: Diagnosis present

## 2017-07-15 DIAGNOSIS — K262 Acute duodenal ulcer with both hemorrhage and perforation: Secondary | ICD-10-CM | POA: Diagnosis present

## 2017-07-15 DIAGNOSIS — E870 Hyperosmolality and hypernatremia: Secondary | ICD-10-CM | POA: Diagnosis not present

## 2017-07-15 DIAGNOSIS — Z6822 Body mass index (BMI) 22.0-22.9, adult: Secondary | ICD-10-CM | POA: Diagnosis not present

## 2017-07-15 DIAGNOSIS — E876 Hypokalemia: Secondary | ICD-10-CM | POA: Diagnosis not present

## 2017-07-15 DIAGNOSIS — K7031 Alcoholic cirrhosis of liver with ascites: Secondary | ICD-10-CM | POA: Diagnosis not present

## 2017-07-15 DIAGNOSIS — E7220 Disorder of urea cycle metabolism, unspecified: Secondary | ICD-10-CM | POA: Diagnosis not present

## 2017-07-15 DIAGNOSIS — E785 Hyperlipidemia, unspecified: Secondary | ICD-10-CM | POA: Diagnosis present

## 2017-07-15 DIAGNOSIS — I110 Hypertensive heart disease with heart failure: Secondary | ICD-10-CM | POA: Diagnosis present

## 2017-07-15 DIAGNOSIS — Z66 Do not resuscitate: Secondary | ICD-10-CM | POA: Diagnosis present

## 2017-07-15 DIAGNOSIS — Z8249 Family history of ischemic heart disease and other diseases of the circulatory system: Secondary | ICD-10-CM

## 2017-07-15 DIAGNOSIS — F1020 Alcohol dependence, uncomplicated: Secondary | ICD-10-CM | POA: Diagnosis present

## 2017-07-15 DIAGNOSIS — E875 Hyperkalemia: Secondary | ICD-10-CM | POA: Diagnosis present

## 2017-07-15 DIAGNOSIS — J90 Pleural effusion, not elsewhere classified: Secondary | ICD-10-CM

## 2017-07-15 DIAGNOSIS — R188 Other ascites: Secondary | ICD-10-CM | POA: Diagnosis not present

## 2017-07-15 DIAGNOSIS — R4182 Altered mental status, unspecified: Secondary | ICD-10-CM | POA: Diagnosis not present

## 2017-07-15 DIAGNOSIS — Z79899 Other long term (current) drug therapy: Secondary | ICD-10-CM

## 2017-07-15 DIAGNOSIS — Z7982 Long term (current) use of aspirin: Secondary | ICD-10-CM

## 2017-07-15 DIAGNOSIS — K266 Chronic or unspecified duodenal ulcer with both hemorrhage and perforation: Secondary | ICD-10-CM | POA: Diagnosis present

## 2017-07-15 DIAGNOSIS — G9349 Other encephalopathy: Secondary | ICD-10-CM | POA: Diagnosis not present

## 2017-07-15 DIAGNOSIS — K921 Melena: Secondary | ICD-10-CM | POA: Diagnosis not present

## 2017-07-15 DIAGNOSIS — M300 Polyarteritis nodosa: Secondary | ICD-10-CM | POA: Diagnosis present

## 2017-07-15 DIAGNOSIS — L0291 Cutaneous abscess, unspecified: Secondary | ICD-10-CM | POA: Diagnosis not present

## 2017-07-15 DIAGNOSIS — L97909 Non-pressure chronic ulcer of unspecified part of unspecified lower leg with unspecified severity: Secondary | ICD-10-CM

## 2017-07-15 DIAGNOSIS — I2510 Atherosclerotic heart disease of native coronary artery without angina pectoris: Secondary | ICD-10-CM | POA: Diagnosis present

## 2017-07-15 DIAGNOSIS — Z792 Long term (current) use of antibiotics: Secondary | ICD-10-CM

## 2017-07-15 DIAGNOSIS — Z515 Encounter for palliative care: Secondary | ICD-10-CM

## 2017-07-15 DIAGNOSIS — R0602 Shortness of breath: Secondary | ICD-10-CM

## 2017-07-15 DIAGNOSIS — K922 Gastrointestinal hemorrhage, unspecified: Secondary | ICD-10-CM | POA: Diagnosis not present

## 2017-07-15 DIAGNOSIS — R400 Somnolence: Secondary | ICD-10-CM | POA: Diagnosis not present

## 2017-07-15 HISTORY — DX: Polyneuropathy, unspecified: G62.9

## 2017-07-15 HISTORY — DX: Essential (primary) hypertension: I10

## 2017-07-15 HISTORY — DX: Gout, unspecified: M10.9

## 2017-07-15 LAB — COMPREHENSIVE METABOLIC PANEL
ALT: 17 U/L (ref 17–63)
AST: 22 U/L (ref 15–41)
Albumin: 2.2 g/dL — ABNORMAL LOW (ref 3.5–5.0)
Alkaline Phosphatase: 90 U/L (ref 38–126)
Anion gap: 13 (ref 5–15)
BILIRUBIN TOTAL: 0.5 mg/dL (ref 0.3–1.2)
BUN: 93 mg/dL — ABNORMAL HIGH (ref 6–20)
CO2: 15 mmol/L — ABNORMAL LOW (ref 22–32)
Calcium: 8.9 mg/dL (ref 8.9–10.3)
Chloride: 105 mmol/L (ref 101–111)
Creatinine, Ser: 3.9 mg/dL — ABNORMAL HIGH (ref 0.61–1.24)
GFR calc Af Amer: 16 mL/min — ABNORMAL LOW (ref >60)
GFR calc non Af Amer: 14 mL/min — ABNORMAL LOW (ref >60)
Glucose, Bld: 115 mg/dL — ABNORMAL HIGH (ref 65–99)
Potassium: 5.6 mmol/L — ABNORMAL HIGH (ref 3.5–5.1)
Sodium: 133 mmol/L — ABNORMAL LOW (ref 135–145)
Total Protein: 5.8 g/dL — ABNORMAL LOW (ref 6.5–8.1)

## 2017-07-15 LAB — CBC WITH DIFFERENTIAL/PLATELET
Basophils Absolute: 0 10*3/uL (ref 0.0–0.1)
Basophils Relative: 0 %
EOS ABS: 0 10*3/uL (ref 0.0–0.7)
EOS PCT: 0 %
HCT: 24.4 % — ABNORMAL LOW (ref 39.0–52.0)
Hemoglobin: 8.4 g/dL — ABNORMAL LOW (ref 13.0–17.0)
LYMPHS ABS: 1.6 10*3/uL (ref 0.7–4.0)
Lymphocytes Relative: 6 %
MCH: 31.5 pg (ref 26.0–34.0)
MCHC: 34.4 g/dL (ref 30.0–36.0)
MCV: 91.4 fL (ref 78.0–100.0)
MONOS PCT: 4 %
Monocytes Absolute: 1 10*3/uL (ref 0.1–1.0)
Neutro Abs: 23.7 10*3/uL — ABNORMAL HIGH (ref 1.7–7.7)
Neutrophils Relative %: 90 %
PLATELETS: 415 10*3/uL — AB (ref 150–400)
RBC: 2.67 MIL/uL — ABNORMAL LOW (ref 4.22–5.81)
RDW: 13.1 % (ref 11.5–15.5)
WBC: 26.2 10*3/uL — AB (ref 4.0–10.5)

## 2017-07-15 LAB — LIPASE, BLOOD: Lipase: 41 U/L (ref 11–51)

## 2017-07-15 LAB — URINALYSIS, ROUTINE W REFLEX MICROSCOPIC
Bilirubin Urine: NEGATIVE
Glucose, UA: NEGATIVE mg/dL
Ketones, ur: NEGATIVE mg/dL
Nitrite: NEGATIVE
PH: 5 (ref 5.0–8.0)
Protein, ur: NEGATIVE mg/dL
SPECIFIC GRAVITY, URINE: 1.011 (ref 1.005–1.030)

## 2017-07-15 LAB — SODIUM, URINE, RANDOM: SODIUM UR: 40 mmol/L

## 2017-07-15 LAB — TROPONIN I: Troponin I: <0.03 ng/mL (ref <0.03)

## 2017-07-15 LAB — CREATININE, URINE, RANDOM: CREATININE, URINE: 80.54 mg/dL

## 2017-07-15 LAB — ETHANOL: Alcohol, Ethyl (B): <10 mg/dL (ref <10)

## 2017-07-15 LAB — BRAIN NATRIURETIC PEPTIDE: B Natriuretic Peptide: 418 pg/mL — ABNORMAL HIGH (ref 0.0–100.0)

## 2017-07-15 MED ORDER — ONDANSETRON HCL 4 MG PO TABS: 4.0000 mg | ORAL_TABLET | Freq: Four times a day (QID) | ORAL | Status: DC | PRN

## 2017-07-15 MED ORDER — ALLOPURINOL 300 MG PO TABS
300.0000 mg | ORAL_TABLET | Freq: Every day | ORAL | Status: DC
  Administered 2017-07-16: 300 mg via ORAL
  Filled 2017-07-15: qty 1

## 2017-07-15 MED ORDER — ASPIRIN EC 81 MG PO TBEC
81.0000 mg | DELAYED_RELEASE_TABLET | Freq: Every day | ORAL | Status: DC
  Administered 2017-07-16 – 2017-07-22 (×3): 81 mg via ORAL
  Filled 2017-07-15 (×4): qty 1

## 2017-07-15 MED ORDER — PANTOPRAZOLE SODIUM 40 MG PO TBEC
40.0000 mg | DELAYED_RELEASE_TABLET | Freq: Every day | ORAL | Status: DC
  Administered 2017-07-16 – 2017-07-17 (×2): 40 mg via ORAL
  Filled 2017-07-15 (×2): qty 1

## 2017-07-15 MED ORDER — SODIUM CHLORIDE 0.9 % IV SOLN
INTRAVENOUS | Status: DC
  Administered 2017-07-15 – 2017-07-17 (×4): via INTRAVENOUS

## 2017-07-15 MED ORDER — HEPARIN SODIUM (PORCINE) 5000 UNIT/ML IJ SOLN
5000.0000 [IU] | Freq: Three times a day (TID) | INTRAMUSCULAR | Status: DC
  Administered 2017-07-16 – 2017-07-17 (×4): 5000 [IU] via SUBCUTANEOUS
  Filled 2017-07-15 (×4): qty 1

## 2017-07-15 MED ORDER — ACETAMINOPHEN 325 MG PO TABS
650.0000 mg | ORAL_TABLET | Freq: Four times a day (QID) | ORAL | Status: DC | PRN
  Administered 2017-07-25: 650 mg via ORAL
  Filled 2017-07-15: qty 2

## 2017-07-15 MED ORDER — ONDANSETRON HCL 4 MG/2ML IJ SOLN: 4.0000 mg | Freq: Four times a day (QID) | INTRAMUSCULAR | Status: DC | PRN

## 2017-07-15 MED ORDER — FERROUS SULFATE 325 (65 FE) MG PO TABS
325.0000 mg | ORAL_TABLET | Freq: Two times a day (BID) | ORAL | Status: DC
  Administered 2017-07-15 – 2017-07-17 (×5): 325 mg via ORAL
  Filled 2017-07-15 (×5): qty 1

## 2017-07-15 MED ORDER — FOLIC ACID 1 MG PO TABS
1.0000 mg | ORAL_TABLET | Freq: Once | ORAL | Status: AC
  Administered 2017-07-15: 1 mg via ORAL
  Filled 2017-07-15: qty 1

## 2017-07-15 MED ORDER — SODIUM CHLORIDE 0.9 % IV BOLUS (SEPSIS)
1000.0000 mL | Freq: Once | INTRAVENOUS | Status: AC
  Administered 2017-07-15: 1000 mL via INTRAVENOUS

## 2017-07-15 MED ORDER — METOPROLOL SUCCINATE ER 25 MG PO TB24
12.5000 mg | ORAL_TABLET | ORAL | Status: DC
  Administered 2017-07-16: 12.5 mg via ORAL
  Filled 2017-07-15: qty 1

## 2017-07-15 MED ORDER — VITAMIN D 1000 UNITS PO TABS
2000.0000 [IU] | ORAL_TABLET | Freq: Two times a day (BID) | ORAL | Status: DC
  Administered 2017-07-15 – 2017-07-17 (×5): 2000 [IU] via ORAL
  Filled 2017-07-15 (×5): qty 2

## 2017-07-15 MED ORDER — ACETAMINOPHEN 650 MG RE SUPP: 650.0000 mg | Freq: Four times a day (QID) | RECTAL | Status: DC | PRN

## 2017-07-15 MED ORDER — VITAMIN B-1 100 MG PO TABS
100.0000 mg | ORAL_TABLET | Freq: Once | ORAL | Status: AC
  Administered 2017-07-15: 100 mg via ORAL
  Filled 2017-07-15: qty 1

## 2017-07-15 NOTE — ED Provider Notes (Signed)
[facility] Provider Note   CSN: [id number] Arrival date & time: 07/15/17  1711     History   Chief Complaint Chief Complaint  Patient presents with  . Fatigue    HPI Jonathan Walker is a 71 y.o. male.  HPI Patient presents with his wife and daughter assist with the HPI. They note that over the past few days, possible week, the patient has had progressive weakness, decreased energy, decreased ability to perform typical activities of daily living. The patient himself acknowledges feeling weak, denies focal pain, denies confusion, denies dyspnea. Family members notes that the patient has chronic nonhealing wounds of both lower extremities as well. They do not report recent fever, chills per Acknowledge that he has multiple medical issues including congestive heart failure, denies a history of renal disease.  Eventually also note that the patient drinks alcohol daily,   Past Medical History:  Diagnosis Date  . Coronary artery disease   . Gout   . Hyperlipidemia   . Hypertension   . Neuropathy     Patient Active Problem List   Diagnosis Date Noted  . Gouty arthritis 01/23/2014  . Acute gouty arthritis 01/16/2014  . Pain in lower limb 11/14/2013  . Other hammer toe (acquired) 08/01/2013  . Onychomycosis due to dermatophyte 01/28/2013  . Pain in joint, ankle and foot 01/28/2013    Past Surgical History:  Procedure Laterality Date  . ABDOMINAL SURGERY    . AORTIC VALVE REPAIR    . CARDIAC CATHETERIZATION    . CORONARY ARTERY BYPASS GRAFT         Home Medications    Prior to Admission medications   Medication Sig Start Date End Date Taking? Authorizing Provider  allopurinol (ZYLOPRIM) 300 MG tablet Take 300 mg by mouth daily.   Yes [provider]  aspirin EC 81 MG tablet Take 81 mg by mouth daily.   Yes [provider]  cephALEXin (KEFLEX) 500 MG capsule Take 500 mg by mouth 3 (three) times daily.   Yes [provider]  Cholecalciferol (VITAMIN D3) 2000 units TABS Take 1 tablet by mouth 2 (two) times daily.   Yes [provider]  Ferrous Sulfate (IRON) 325 (65 Fe) MG TABS Take 1 tablet by mouth 2 (two) times daily.    Yes [provider]  furosemide (LASIX) 20 MG tablet Take 20 mg by mouth daily.    Yes [provider]  metoprolol succinate (TOPROL-XL) 25 MG 24 hr tablet Take 12.5 mg by mouth every other day.    Yes [provider]  omeprazole (PRILOSEC) 20 MG capsule Take 20 mg by mouth daily.   Yes [provider]  potassium chloride SA (K-DUR,KLOR-CON) 20 MEQ tablet Take 20 mEq by mouth daily.   Yes [provider]  PREDNISONE PO Take 1 tablet by mouth daily.   Yes [provider]    Family History Family History  Problem Relation Age of Onset  . Heart disease Brother     Social History Social History  Substance Use Topics  . Smoking status: Never Smoker  . Smokeless tobacco: Current User    Types: Chew  . Alcohol use Yes     Comment: 2 40 0z daily     Allergies   Patient has no known allergies.   Review of Systems Review of Systems  Constitutional:       Per HPI, otherwise negative  HENT:       Per  HPI, otherwise negative  Respiratory:       Per HPI, otherwise negative  Cardiovascular:       Per HPI, otherwise negative  Gastrointestinal: Negative for vomiting.  Endocrine:       Negative aside from HPI  Genitourinary:       Neg aside from HPI   Musculoskeletal:       Per HPI, otherwise negative  Skin: Positive for wound.  Neurological: Positive for weakness. Negative for syncope.     Physical Exam Updated Vital Signs BP (!) 142/80   Pulse 85   Temp 97.7 F (36.5 C) (Oral)   Resp 18   Wt 71.7 kg (158 lb)   SpO2 95%   BMI 24.02 kg/m   Physical Exam  Constitutional: He is oriented to person, place, and time. He has a sickly appearance. No distress.  HENT:  Head: Normocephalic and  atraumatic.  Eyes: Conjunctivae and EOM are normal.  Cardiovascular: Normal rate and regular rhythm.   Pulmonary/Chest: Effort normal. No stridor. No respiratory distress.  Abdominal: He exhibits no distension. There is no tenderness. There is no rigidity, no guarding, no tenderness at McBurney's point and negative Murphy's sign.  Musculoskeletal: He exhibits no edema.  Neurological: He is alert and oriented to person, place, and time. He displays atrophy. He displays no tremor. He displays no seizure activity.  Patient not ambulated secondary to weakness  Skin: Skin is warm and dry.     Psychiatric: He is slowed and withdrawn.  Nursing note and vitals reviewed.    ED Treatments / Results  Labs (all labs ordered are listed, but only abnormal results are displayed) Labs Reviewed  COMPREHENSIVE METABOLIC PANEL - Abnormal; Notable for the following:       Result Value   Sodium 133 (*)    Potassium 5.6 (*)    CO2 15 (*)    Glucose, Bld 115 (*)    BUN 93 (*)    Creatinine, Ser 3.90 (*)    Total Protein 5.8 (*)    Albumin 2.2 (*)    GFR calc non Af Amer 14 (*)    GFR calc Af Amer 16 (*)    All other components within normal limits  BRAIN NATRIURETIC PEPTIDE - Abnormal; Notable for the following:    B Natriuretic Peptide 418.0 (*)    All other components within normal limits  CBC WITH DIFFERENTIAL/PLATELET - Abnormal; Notable for the following:    WBC 26.2 (*)    RBC 2.67 (*)    Hemoglobin 8.4 (*)    HCT 24.4 (*)    Platelets 415 (*)    Neutro Abs 23.7 (*)    All other components within normal limits  URINALYSIS, ROUTINE W REFLEX MICROSCOPIC - Abnormal; Notable for the following:    APPearance HAZY (*)    Hgb urine dipstick LARGE (*)    Leukocytes, UA TRACE (*)    Bacteria, UA RARE (*)    Squamous Epithelial / LPF 0-5 (*)    All other components within normal limits  ETHANOL  LIPASE, BLOOD  TROPONIN I    EKG  EKG Interpretation  Date/Time:  Sunday July 15 2017  17:12:34 EDT Ventricular Rate:  83 PR Interval:    QRS Duration: 103 QT Interval:  387 QTC Calculation: 455 R Axis:   6 Text Interpretation:  Sinus rhythm Low voltage, precordial leads Artifact Otherwise within normal limits Confirmed by Gerhard Munch [phone]) on 07/15/2017 5:18:54 PM  Radiology Dg Chest 2 View  Result Date: 07/15/2017 CLINICAL DATA:  Subacute onset of generalized weakness and hypotension. Initial encounter. EXAM: CHEST  2 VIEW COMPARISON:  Chest radiograph performed 12/16/2012 FINDINGS: There is mild elevation of the right hemidiaphragm. Mild bilateral atelectasis is noted. No significant pleural effusion or pneumothorax is seen. The heart is mildly enlarged. The patient is status post median sternotomy. A large hiatal hernia is noted. No acute osseous abnormalities are seen. IMPRESSION: 1. Mild elevation of the right hemidiaphragm. Mild bibasilar atelectasis. 2. Mild cardiomegaly. 3. Large hiatal hernia. Electronically Signed   By: Roanna Raider M.D.   On: 07/15/2017 21:42    Procedures Procedures (including critical care time)  Medications Ordered in ED Medications  folic acid (FOLVITE) tablet 1 mg (1 mg Oral Given 07/15/17 1742)  thiamine (VITAMIN B-1) tablet 100 mg (100 mg Oral Given 07/15/17 1742)  sodium chloride 0.9 % bolus 1,000 mL (1,000 mLs Intravenous New Bag/Given 07/15/17 2020)     Initial Impression / Assessment and Plan / ED Course  I have reviewed the triage vital signs and the nursing notes.  Pertinent labs & imaging results that were available during my care of the patient were reviewed by me and considered in my medical decision making (see chart for details).  Repeat exam the patient is in stable condition, weak appearing. I discussed findings with the family members, and the reports that the patient has no history of kidney disease. With concern for acute kidney injury, hyperkalemia, patient will require fluid resuscitation,  admission. Patient does have leukocytosis, no obvious source for infection. Nonhealing wounds do not have surrounding erythema, no drainage, bleeding.  9:47 PM Patient in similar condition. Family also confirmed that the patient has been using Lasix for congestive heart failure.   Final Clinical Impressions(s) / ED Diagnoses  Acute kidney injury Hyperkalemia Weakness   Gerhard Munch, MD 07/15/17 2153

## 2017-07-15 NOTE — ED Triage Notes (Signed)
Pt brought in by EMS due to weakness for 2 weeks. Wife concerned because she couldn't obtain BP on pt . Per family hgb low 2 weeks ago. Denies pain . Has ulcers on legs. EMS initial BP 90/58 then increase 118/64. Pt  has received 300 cc of fluids en route

## 2017-07-15 NOTE — ED Notes (Signed)
Attempted to call wife at [phone] x [phone] and at [phone] without success to notify pt's room #

## 2017-07-15 NOTE — H&P (Signed)
TRH H&P    Patient Demographics:    Jonathan Walker, is a 71 y.o. male  MRN: [id number]  DOB - 05/31/1946  Admit Date - 07/15/2017  Referring MD/NP/PA: Jeraldine Loots  Outpatient Primary MD for the patient is Samuel Jester, DO  Patient coming from: Home  Chief Complaint  Patient presents with  . Fatigue      HPI:    Jonathan Walker  is a 71 y.o. male, history of CAD status post CABG, hypertension, hyperlipidemia, gout who was brought to hospital for progressive weakness over the past few weeks.  As per the patient's wife, patient's dose of Lasix was changed a month ago.  Previously he was taking Lasix every other day but changed Lasix to every day due to lower extremity edema. Since then patient has started to decline. He has not been making enough urine over the past few days.  He denies dysuria, urgency  He denies chest pain or shortness of breath. Denies nausea vomiting or diarrhea. Denies abdominal pain. No previous history of stroke or seizures He drinks 2 beers every day  In the ED, lab work revealed acute kidney injury with creatinine 3.90, potassium 5.6. WBC 26.2, troponin 0.03, BNP 418    Review of systems:      All other systems reviewed and are negative.   With Past History of the following :    Past Medical History:  Diagnosis Date  . Coronary artery disease   . Gout   . Hyperlipidemia   . Hypertension   . Neuropathy       Past Surgical History:  Procedure Laterality Date  . ABDOMINAL SURGERY    . AORTIC VALVE REPAIR    . CARDIAC CATHETERIZATION    . CORONARY ARTERY BYPASS GRAFT        Social History:      Social History  Substance Use Topics  . Smoking status: Never Smoker  . Smokeless tobacco: Current User    Types: Chew  . Alcohol use Yes     Comment: 2 40 0z daily       Family History :     Family History  Problem Relation Age of Onset  . Heart disease  Brother    Patient's mother died of breast cancer   Home Medications:   Prior to Admission medications   Medication Sig Start Date End Date Taking? Authorizing Provider  allopurinol (ZYLOPRIM) 300 MG tablet Take 300 mg by mouth daily.   Yes [provider]  aspirin EC 81 MG tablet Take 81 mg by mouth daily.   Yes [provider]  cephALEXin (KEFLEX) 500 MG capsule Take 500 mg by mouth 3 (three) times daily.   Yes [provider]  Cholecalciferol (VITAMIN D3) 2000 units TABS Take 1 tablet by mouth 2 (two) times daily.   Yes [provider]  Ferrous Sulfate (IRON) 325 (65 Fe) MG TABS Take 1 tablet by mouth 2 (two) times daily.    Yes [provider]  furosemide (LASIX) 20 MG tablet Take 20 mg  by mouth daily.    Yes [provider]  metoprolol succinate (TOPROL-XL) 25 MG 24 hr tablet Take 12.5 mg by mouth every other day.    Yes [provider]  omeprazole (PRILOSEC) 20 MG capsule Take 20 mg by mouth daily.   Yes [provider]  potassium chloride SA (K-DUR,KLOR-CON) 20 MEQ tablet Take 20 mEq by mouth daily.   Yes [provider]  PREDNISONE PO Take 1 tablet by mouth daily.   Yes [provider]     Allergies:    No Known Allergies   Physical Exam:   Vitals  Blood pressure (!) 142/80, pulse 85, temperature 97.7 F (36.5 C), temperature source Oral, resp. rate 18, weight 71.7 kg (158 lb), SpO2 95 %.  1.  General: Appears in no acute distress  2. Psychiatric:  Intact judgement and  insight, awake alert, oriented x 3.  3. Neurologic: No focal neurological deficits, all cranial nerves intact.Strength 5/5 all 4 extremities, sensation intact all 4 extremities, plantars down going.  4. Eyes :  anicteric sclerae, moist conjunctivae with no lid lag. PERRLA.  5. ENMT:  Oropharynx clear with moist mucous membranes and good dentition  6. Neck:  supple, no cervical lymphadenopathy appriciated, No  thyromegaly  7. Respiratory : Normal respiratory effort, good air movement bilaterally,clear to  auscultation bilaterally  8. Cardiovascular : RRR, no gallops, rubs or murmurs, bilateral 1+ edema of lower extremities.  Peripheral pulses not palpable in the right lower extremity, 2+ left lower extremity  9. Gastrointestinal:  Positive bowel sounds, abdomen soft, non-tender to palpation,no hepatosplenomegaly, no rigidity or guarding       10. Skin:  3 skin ulcers noted in  right lower extremity  11.Musculoskeletal:  Good muscle tone,  joints appear normal , no effusions,  normal range of motion    Data Review:    CBC  Recent Labs Lab 07/15/17 1833  WBC 26.2*  HGB 8.4*  HCT 24.4*  PLT 415*  MCV 91.4  MCH 31.5  MCHC 34.4  RDW 13.1  LYMPHSABS 1.6  MONOABS 1.0  EOSABS 0.0  BASOSABS 0.0   ------------------------------------------------------------------------------------------------------------------  Chemistries   Recent Labs Lab 07/15/17 1833  NA 133*  K 5.6*  CL 105  CO2 15*  GLUCOSE 115*  BUN 93*  CREATININE 3.90*  CALCIUM 8.9  AST 22  ALT 17  ALKPHOS 90  BILITOT 0.5   ------------------------------------------------------------------------------------------------------------------  ------------------------------------------------------------------------------------------------------------------ GFR: CrCl cannot be calculated (Unknown ideal weight.). Liver Function Tests:  Recent Labs Lab 07/15/17 1833  AST 22  ALT 17  ALKPHOS 90  BILITOT 0.5  PROT 5.8*  ALBUMIN 2.2*    Recent Labs Lab 07/15/17 1833  LIPASE 41   No results for input(s): AMMONIA in the last 168 hours. Coagulation Profile: No results for input(s): INR, PROTIME in the last 168 hours. Cardiac Enzymes:  Recent Labs Lab 07/15/17 1833  TROPONINI <0.03     --------------------------------------------------------------------------------------------------------------- Urine analysis:    Component Value Date/Time   COLORURINE YELLOW 07/15/2017 1737   APPEARANCEUR HAZY (A) 07/15/2017 1737   LABSPEC 1.011 07/15/2017 1737   PHURINE 5.0 07/15/2017 1737   GLUCOSEU NEGATIVE 07/15/2017 1737   HGBUR LARGE (A) 07/15/2017 1737   BILIRUBINUR NEGATIVE 07/15/2017 1737   KETONESUR NEGATIVE 07/15/2017 1737   PROTEINUR NEGATIVE 07/15/2017 1737   NITRITE NEGATIVE 07/15/2017 1737   LEUKOCYTESUR TRACE (A) 07/15/2017 1737      Imaging Results:    Dg Chest 2 View  Result Date: 07/15/2017  CLINICAL DATA:  Subacute onset of generalized weakness and hypotension. Initial encounter. EXAM: CHEST  2 VIEW COMPARISON:  Chest radiograph performed 12/16/2012 FINDINGS: There is mild elevation of the right hemidiaphragm. Mild bilateral atelectasis is noted. No significant pleural effusion or pneumothorax is seen. The heart is mildly enlarged. The patient is status post median sternotomy. A large hiatal hernia is noted. No acute osseous abnormalities are seen. IMPRESSION: 1. Mild elevation of the right hemidiaphragm. Mild bibasilar atelectasis. 2. Mild cardiomegaly. 3. Large hiatal hernia. Electronically Signed   By: Roanna RaiderJeffery  Chang M.D.   On: 07/15/2017 21:42    My personal review of EKG: Rhythm NSR   Assessment & Plan:    Active Problems:   AKI (acute kidney injury) (HCC)   1. Acute kidney injury-likely from overdiuresis, frequency of Lasix dosing was changed a month ago from every other day to daily by patient's PCP.  Will hold Lasix at this time.  Has been started on IV normal saline at 100 mL/h.  Consult nephrology in a.m.  Urine creatinine, urine sodium to assess fractional excretion of sodium.  We will also obtain a renal ultrasound. 2. Chronic systolic CHF-compensated, hold Lasix due to above. 3. Hypertension-blood pressure stable, continue  metoprolol. 4. Prednisone taper-patient was on prednisone taper, dose not known as per patient wife.  His last dose was supposed to be 07/16/2017.  Will discontinue prednisone at this time. 5. Leg ulcers-likely from underlying peripheral arterial disease.  Had a workup for PAD as outpatient as per wife.  Consult wound care in a.m.  And follow-up with vascular surgery as outpatient   DVT Prophylaxis-  Heparin  AM Labs Ordered, also please review Full Orders  Family Communication: Admission, patients condition and plan of care including tests being ordered have been discussed with the patient and his wife and daughter-in-law at bedside who indicate understanding and agree with the plan and Code Status.  Code Status: Full code  Admission status: Inpatient  Time spent in minutes : 6 0 minutes   Liat Mayol S M.D on 07/15/2017 at 9:54 PM  Between 7am to 7pm - Pager - [phone]. After 7pm go to [email or web address] - password [email or web address]  [facility] - Office  [phone]

## 2017-07-16 ENCOUNTER — Inpatient Hospital Stay (HOSPITAL_COMMUNITY): Payer: Medicare Other

## 2017-07-16 ENCOUNTER — Ambulatory Visit (INDEPENDENT_AMBULATORY_CARE_PROVIDER_SITE_OTHER): Payer: Medicare Other | Admitting: Internal Medicine

## 2017-07-16 ENCOUNTER — Telehealth (INDEPENDENT_AMBULATORY_CARE_PROVIDER_SITE_OTHER): Payer: Self-pay | Admitting: Internal Medicine

## 2017-07-16 DIAGNOSIS — M300 Polyarteritis nodosa: Secondary | ICD-10-CM

## 2017-07-16 DIAGNOSIS — D649 Anemia, unspecified: Secondary | ICD-10-CM

## 2017-07-16 LAB — COMPREHENSIVE METABOLIC PANEL
ALK PHOS: 82 U/L (ref 38–126)
ALT: 15 U/L — ABNORMAL LOW (ref 17–63)
ANION GAP: 13 (ref 5–15)
AST: 19 U/L (ref 15–41)
Albumin: 2 g/dL — ABNORMAL LOW (ref 3.5–5.0)
BILIRUBIN TOTAL: 0.4 mg/dL (ref 0.3–1.2)
BUN: 91 mg/dL — ABNORMAL HIGH (ref 6–20)
CALCIUM: 8.4 mg/dL — AB (ref 8.9–10.3)
CO2: 14 mmol/L — ABNORMAL LOW (ref 22–32)
Chloride: 108 mmol/L (ref 101–111)
Creatinine, Ser: 3.83 mg/dL — ABNORMAL HIGH (ref 0.61–1.24)
GFR calc non Af Amer: 15 mL/min — ABNORMAL LOW (ref >60)
GFR, EST AFRICAN AMERICAN: 17 mL/min — AB (ref >60)
Glucose, Bld: 75 mg/dL (ref 65–99)
Potassium: 5.1 mmol/L (ref 3.5–5.1)
Sodium: 135 mmol/L (ref 135–145)
TOTAL PROTEIN: 5.3 g/dL — AB (ref 6.5–8.1)

## 2017-07-16 LAB — CBC
HEMATOCRIT: 22.7 % — AB (ref 39.0–52.0)
Hemoglobin: 7.9 g/dL — ABNORMAL LOW (ref 13.0–17.0)
MCH: 32 pg (ref 26.0–34.0)
MCHC: 34.8 g/dL (ref 30.0–36.0)
MCV: 91.9 fL (ref 78.0–100.0)
Platelets: 387 10*3/uL (ref 150–400)
RBC: 2.47 MIL/uL — ABNORMAL LOW (ref 4.22–5.81)
RDW: 13.1 % (ref 11.5–15.5)
WBC: 22.4 10*3/uL — ABNORMAL HIGH (ref 4.0–10.5)

## 2017-07-16 MED ORDER — COLLAGENASE 250 UNIT/GM EX OINT
TOPICAL_OINTMENT | Freq: Every day | CUTANEOUS | Status: DC
  Administered 2017-07-16 – 2017-07-21 (×6): via TOPICAL
  Administered 2017-07-22: 1 via TOPICAL
  Administered 2017-07-23 – 2017-07-24 (×2): via TOPICAL
  Filled 2017-07-16 (×3): qty 30

## 2017-07-16 MED ORDER — IOPAMIDOL (ISOVUE-300) INJECTION 61%
INTRAVENOUS | Status: AC
  Filled 2017-07-16: qty 30

## 2017-07-16 MED ORDER — METOPROLOL SUCCINATE ER 25 MG PO TB24
12.5000 mg | ORAL_TABLET | Freq: Every day | ORAL | Status: DC
  Administered 2017-07-17 – 2017-07-23 (×3): 12.5 mg via ORAL
  Filled 2017-07-16 (×4): qty 1

## 2017-07-16 MED ORDER — ALLOPURINOL 300 MG PO TABS
300.0000 mg | ORAL_TABLET | ORAL | Status: DC
  Administered 2017-07-22: 300 mg via ORAL
  Filled 2017-07-16 (×3): qty 1

## 2017-07-16 NOTE — Telephone Encounter (Signed)
Someone called this morning for the patient and cancelled his appointment.  [facility]  He was [facility] yesterday.  He will call and reschedule when he gets better.

## 2017-07-16 NOTE — Consult Note (Signed)
Reason for Consult: Renal failure Referring Physician: Dr. Tonita Cong [name] is an 71 y.o. male.  HPI: He is a patient who has history of hypertension, gout, coronary artery disease status post bypass surgery presently came to the emergency room with weakness and progressive difficulty in walking. According to the patient recently he had multiple wound on his right leg and was being treated with antibiotics. At this moment patient doesn't remember the type of antibiotics is taking. Even though the wound is getting better and continued to feel tired and weak and decided to come to the emergency room. When he was evaluated and was found to have acute renal failure hence admitted to the hospital. Patient denies any previous history of renal failure or kidney stone. He denies also any nausea or vomiting.  Past Medical History:  Diagnosis Date  . Coronary artery disease   . Gout   . Hyperlipidemia   . Hypertension   . Neuropathy     Past Surgical History:  Procedure Laterality Date  . ABDOMINAL SURGERY    . AORTIC VALVE REPAIR    . CARDIAC CATHETERIZATION    . CORONARY ARTERY BYPASS GRAFT      Family History  Problem Relation Age of Onset  . Heart disease Brother     Social History:  reports that he has never smoked. His smokeless tobacco use includes Chew. He reports that he drinks alcohol. His drug history is not on file.  Allergies: No Known Allergies  Medications: I have reviewed the patient's current medications.  Results for orders placed or performed during the hospital encounter of 07/15/17 (from the past 48 hour(s))  Urinalysis, Routine w reflex microscopic     Status: Abnormal   Collection Time: 07/15/17  5:37 PM  Result Value Ref Range   Color, Urine YELLOW YELLOW   APPearance HAZY (A) CLEAR   Specific Gravity, Urine 1.011 1.005 - 1.030   pH 5.0 5.0 - 8.0   Glucose, UA NEGATIVE NEGATIVE mg/dL   Hgb urine dipstick LARGE (A) NEGATIVE   Bilirubin Urine NEGATIVE  NEGATIVE   Ketones, ur NEGATIVE NEGATIVE mg/dL   Protein, ur NEGATIVE NEGATIVE mg/dL   Nitrite NEGATIVE NEGATIVE   Leukocytes, UA TRACE (A) NEGATIVE   RBC / HPF TOO NUMEROUS TO COUNT 0 - 5 RBC/hpf   WBC, UA 6-30 0 - 5 WBC/hpf   Bacteria, UA RARE (A) NONE SEEN   Squamous Epithelial / LPF 0-5 (A) NONE SEEN   Mucus PRESENT   Comprehensive metabolic panel     Status: Abnormal   Collection Time: 07/15/17  6:33 PM  Result Value Ref Range   Sodium 133 (L) 135 - 145 mmol/L   Potassium 5.6 (H) 3.5 - 5.1 mmol/L   Chloride 105 101 - 111 mmol/L   CO2 15 (L) 22 - 32 mmol/L   Glucose, Bld 115 (H) 65 - 99 mg/dL   BUN 93 (H) 6 - 20 mg/dL   Creatinine, Ser 3.90 (H) 0.61 - 1.24 mg/dL   Calcium 8.9 8.9 - 10.3 mg/dL   Total Protein 5.8 (L) 6.5 - 8.1 g/dL   Albumin 2.2 (L) 3.5 - 5.0 g/dL   AST 22 15 - 41 U/L   ALT 17 17 - 63 U/L   Alkaline Phosphatase 90 38 - 126 U/L   Total Bilirubin 0.5 0.3 - 1.2 mg/dL   GFR calc non Af Amer 14 (L) >60 mL/min   GFR calc Af Amer 16 (L) >60 mL/min  Comment: (NOTE) The eGFR has been calculated using the CKD EPI equation. This calculation has not been validated in all clinical situations. eGFR's persistently <60 mL/min signify possible Chronic Kidney Disease.    Anion gap 13 5 - 15  Lipase, blood     Status: None   Collection Time: 07/15/17  6:33 PM  Result Value Ref Range   Lipase 41 11 - 51 U/L  Troponin I     Status: None   Collection Time: 07/15/17  6:33 PM  Result Value Ref Range   Troponin I <0.03 <0.03 ng/mL  CBC with Differential     Status: Abnormal   Collection Time: 07/15/17  6:33 PM  Result Value Ref Range   WBC 26.2 (H) 4.0 - 10.5 K/uL   RBC 2.67 (L) 4.22 - 5.81 MIL/uL   Hemoglobin 8.4 (L) 13.0 - 17.0 g/dL   HCT 24.4 (L) 39.0 - 52.0 %   MCV 91.4 78.0 - 100.0 fL   MCH 31.5 26.0 - 34.0 pg   MCHC 34.4 30.0 - 36.0 g/dL   RDW 13.1 11.5 - 15.5 %   Platelets 415 (H) 150 - 400 K/uL   Neutrophils Relative % 90 %   Neutro Abs 23.7 (H) 1.7 - 7.7  K/uL   Lymphocytes Relative 6 %   Lymphs Abs 1.6 0.7 - 4.0 K/uL   Monocytes Relative 4 %   Monocytes Absolute 1.0 0.1 - 1.0 K/uL   Eosinophils Relative 0 %   Eosinophils Absolute 0.0 0.0 - 0.7 K/uL   Basophils Relative 0 %   Basophils Absolute 0.0 0.0 - 0.1 K/uL   WBC Morphology WHITE COUNT CONFIRMED ON SMEAR   Ethanol     Status: None   Collection Time: 07/15/17  6:34 PM  Result Value Ref Range   Alcohol, Ethyl (B) <10 <10 mg/dL    Comment:        LOWEST DETECTABLE LIMIT FOR SERUM ALCOHOL IS 10 mg/dL FOR MEDICAL PURPOSES ONLY   Brain natriuretic peptide     Status: Abnormal   Collection Time: 07/15/17  6:34 PM  Result Value Ref Range   B Natriuretic Peptide 418.0 (H) 0.0 - 100.0 pg/mL  Sodium, urine, random     Status: None   Collection Time: 07/15/17 11:25 PM  Result Value Ref Range   Sodium, Ur 40 mmol/L  Creatinine, urine, random     Status: None   Collection Time: 07/15/17 11:25 PM  Result Value Ref Range   Creatinine, Urine 80.54 mg/dL  CBC     Status: Abnormal   Collection Time: 07/16/17  4:23 AM  Result Value Ref Range   WBC 22.4 (H) 4.0 - 10.5 K/uL   RBC 2.47 (L) 4.22 - 5.81 MIL/uL   Hemoglobin 7.9 (L) 13.0 - 17.0 g/dL   HCT 22.7 (L) 39.0 - 52.0 %   MCV 91.9 78.0 - 100.0 fL   MCH 32.0 26.0 - 34.0 pg   MCHC 34.8 30.0 - 36.0 g/dL   RDW 13.1 11.5 - 15.5 %   Platelets 387 150 - 400 K/uL  Comprehensive metabolic panel     Status: Abnormal   Collection Time: 07/16/17  4:23 AM  Result Value Ref Range   Sodium 135 135 - 145 mmol/L   Potassium 5.1 3.5 - 5.1 mmol/L   Chloride 108 101 - 111 mmol/L   CO2 14 (L) 22 - 32 mmol/L   Glucose, Bld 75 65 - 99 mg/dL   BUN 91 (H) 6 -  20 mg/dL   Creatinine, Ser 8.93 (H) 0.61 - 1.24 mg/dL   Calcium 8.4 (L) 8.9 - 10.3 mg/dL   Total Protein 5.3 (L) 6.5 - 8.1 g/dL   Albumin 2.0 (L) 3.5 - 5.0 g/dL   AST 19 15 - 41 U/L   ALT 15 (L) 17 - 63 U/L   Alkaline Phosphatase 82 38 - 126 U/L   Total Bilirubin 0.4 0.3 - 1.2 mg/dL   GFR  calc non Af Amer 15 (L) >60 mL/min   GFR calc Af Amer 17 (L) >60 mL/min    Comment: (NOTE) The eGFR has been calculated using the CKD EPI equation. This calculation has not been validated in all clinical situations. eGFR's persistently <60 mL/min signify possible Chronic Kidney Disease.    Anion gap 13 5 - 15    Dg Chest 2 View  Result Date: 07/15/2017 CLINICAL DATA:  Subacute onset of generalized weakness and hypotension. Initial encounter. EXAM: CHEST  2 VIEW COMPARISON:  Chest radiograph performed 12/16/2012 FINDINGS: There is mild elevation of the right hemidiaphragm. Mild bilateral atelectasis is noted. No significant pleural effusion or pneumothorax is seen. The heart is mildly enlarged. The patient is status post median sternotomy. A large hiatal hernia is noted. No acute osseous abnormalities are seen. IMPRESSION: 1. Mild elevation of the right hemidiaphragm. Mild bibasilar atelectasis. 2. Mild cardiomegaly. 3. Large hiatal hernia. Electronically Signed   By: Roanna Raider M.D.   On: 07/15/2017 21:42    Review of Systems  Constitutional: Positive for malaise/fatigue. Negative for chills and fever.  Respiratory: Negative for cough and shortness of breath.   Cardiovascular: Positive for leg swelling. Negative for chest pain and orthopnea.  Gastrointestinal: Negative for abdominal pain, nausea and vomiting.  Neurological: Positive for weakness.   Blood pressure (!) 105/58, pulse 99, temperature 98.8 F (37.1 C), temperature source Oral, resp. rate 16, height 5\' 10"  (1.778 m), weight 70.9 kg (156 lb 4.9 oz), SpO2 99 %. Physical Exam  Constitutional: He is oriented to person, place, and time. No distress.  HENT:  Mouth/Throat: No oropharyngeal exudate.  Eyes: No scleral icterus.  Neck: No JVD present.  Cardiovascular: Normal rate and regular rhythm.   No murmur heard. Respiratory: No respiratory distress. He has no wheezes.  GI: He exhibits no distension. There is no  tenderness.  Musculoskeletal: He exhibits no edema.  He has dressing on the right leg.  Neurological: He is alert and oriented to person, place, and time.    Assessment/Plan: Problem #1 renal failure at this moment no sure whether this is acute or chronic. According the patient didn't have any previous history of renal failure. The etiology could be secondary to ATN versus prerenal syndrome. It could be also secondary to AIN. Patient has this moment denies any nausea or vomiting. Problem #2 hyperkalemia: Most likely secondary to potassium supplement and worsening of renal failure. His potassium has improved Problem #3 hypertension: His blood pressure is reasonably controlled Problem #4 history of gout Problem #5 history of coronary artery disease status post by his surgery Problem #6 history of leg ulcer: At this moment seems to be healing. Problem #7 history of peripheral vascular disease Problem #8 history of CHF: Patient was on Lasix as an outpatient. Presently he doesn't have any sign of fluid overload. Plan:1] We'll do ultrasound of the kidneys 2] we'll do ANA, complement, hepatitis B surface antigen, hepatitis C antibodies 3] we'll do 24-hour urine for creatinine clearance, protein and immunoelectrophoresis 4] we'll check  his renal panel in the morning.  Jonathan Walker S 07/16/2017, 9:10 AM

## 2017-07-16 NOTE — Progress Notes (Signed)
PROGRESS NOTE  Jonathan MOREFIELD  AVW:[id number] DOB: 1946/08/12 DOA: 07/15/2017 PCP: Samuel Jester, DO  Brief Narrative:   Patient is a 71 year old CAD s/p CABG, hypertension, hyperlipidemia, gout who resents with weakness and was found to have AKI.  His lasix was increased from every other day to daily dosing about 1 month ago.  For the last few days, however, he has not made very much urine output and he has become very fatigued.  Additionally, he was seen by dermatology, Dr. Margo Aye this month for some new right lower extremity ulcers.  They were biopsied and biopsy demonstrated no evidence of vasculitis, but sample was superficial and nonspecific.  He was diagnosed with polyarteritis nodosum and given keflex and prednisone taper which have helped his ulcers.  He had a weakly positive ANA and CANCA, both titers were 1:80 earlier this month.      01/30/2017 Creatinine 0.9 06/01/17 Creatinine 1.6 06/25/17 Creatinine 1.38  Assessment & Plan:   Active Problems:   AKI (acute kidney injury) (HCC)  Acute kidney injury with microscopic hematuria/evidence of possible nephritis.  Baseline creatinine is 0.9 on May 8th, 2018.  On  and at that time his lasix dose was increased.  Creatinine 1.38 on Oct 1st.  Possible this may all be due to ATN from dehydration/overdiuresis, but autoimmune disorder also possible.   -  Nephrology consultation appreciated -  ANA has been repeated -  Mpo/pr-3 ANCA ab pending -  Complement levels pending -  HCV and HBV pending -  HIV with morning labs -  UPEP being collected -  Continue IVF -  Renal US:  Minimally complex bilateral renal cysts, unchanged from prior -  May need renal biopsy  Incidental finding of possible complex fluid adjacent to the liver -  CT scan with ORAL but not IV contrast  Coronary artery disease, stable -  Continue aspirin and beta blocker -  Not on statin  Gout, stable, continue allopurinol  Hypertension, blood pressure low normal -  Hold  parameter for metoprolol  Leg ulcers, 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  Not making much urine.  Wife contributes to history.    Objective: Vitals:   07/15/17 2215 07/15/17 2300 07/16/17 0500 07/16/17 0835  BP:  109/73 (!) 105/58   Pulse: 88 97 99   Resp: 14 14 16    Temp:  98.2 F (36.8 C) 98.8 F (37.1 C)   TempSrc:  Oral Oral   SpO2: 100% 100% 99% 98%  Weight:  70.9 kg (156 lb 4.9 oz)    Height:  5\' 10"  (1.778 m)      Intake/Output Summary (Last 24 hours) at 07/16/17 1149 Last data filed at 07/16/17 0300  Gross per 24 hour  Intake           393.33 ml  Output              200 ml  Net           193.33 ml   Filed Weights   07/15/17 1707 07/15/17 2300  Weight: 71.7 kg (158 lb) 70.9 kg (156 lb 4.9 oz)    Examination:  General exam:  Adult male.  No acute distress.  HEENT:  NCAT, MMM Respiratory system: Clear to auscultation bilaterally Cardiovascular system: Regular rate and rhythm, possible gallop and systolic murmur.  Warm extremities Gastrointestinal system: Normal active bowel sounds, soft, nondistended, nontender. MSK:  Normal tone and bulk, 1+ pitting bilateral lower extremity edema.  Derm:  RLE:   2cm ulcer on upper medial calf.  1cm deep ulcer on posterior right calf and third ulcer more distal and lateral, all with some yellow slough and deeper than what I would expect for venous stasis ulcers.  No surrounding  erythema or induration.   Neuro:  Grossly moves all extremities    Data Reviewed: I have personally reviewed following labs and imaging studies  CBC:  Recent Labs Lab 07/15/17 1833 07/16/17 0423  WBC 26.2* 22.4*  NEUTROABS 23.7*  --   HGB 8.4* 7.9*  HCT 24.4* 22.7*  MCV 91.4 91.9  PLT 415* 387   Basic Metabolic Panel:  Recent Labs Lab 07/15/17 1833 07/16/17 0423  NA 133* 135  K 5.6* 5.1  CL 105 108  CO2 15* 14*  GLUCOSE 115* 75  BUN 93* 91*  CREATININE 3.90* 3.83*  CALCIUM 8.9 8.4*   GFR: Estimated Creatinine Clearance: 17.7 mL/min (A) (by C-G formula based on SCr of 3.83 mg/dL (H)). Liver Function Tests:  Recent Labs Lab 07/15/17 1833 07/16/17 0423  AST 22 19  ALT 17 15*  ALKPHOS 90 82  BILITOT 0.5 0.4  PROT 5.8* 5.3*  ALBUMIN 2.2* 2.0*    Recent Labs Lab 07/15/17 1833  LIPASE 41   No results for input(s): AMMONIA in the last 168 hours. Coagulation Profile: No results for input(s): INR, PROTIME in the last 168 hours. Cardiac Enzymes:  Recent Labs Lab 07/15/17 1833  TROPONINI <0.03   BNP (last 3 results) No results for input(s): PROBNP in the last 8760 hours. HbA1C: No results for input(s): HGBA1C in the last 72 hours. CBG: No results for input(s): GLUCAP in the last 168 hours. Lipid Profile: No results for input(s): CHOL, HDL, LDLCALC, TRIG, CHOLHDL, LDLDIRECT in the last 72 hours. Thyroid Function Tests: No results for input(s): TSH, T4TOTAL, FREET4, T3FREE, THYROIDAB in the last 72 hours. Anemia Panel: No results for input(s): VITAMINB12, FOLATE, FERRITIN, TIBC, IRON, RETICCTPCT in the last 72 hours. Urine analysis:    Component Value Date/Time   COLORURINE YELLOW 07/15/2017 1737   APPEARANCEUR HAZY (A) 07/15/2017 1737   LABSPEC 1.011 07/15/2017 1737   PHURINE 5.0 07/15/2017 1737   GLUCOSEU NEGATIVE 07/15/2017 1737   HGBUR LARGE (A) 07/15/2017 1737   BILIRUBINUR NEGATIVE 07/15/2017 1737   KETONESUR NEGATIVE 07/15/2017 1737    PROTEINUR NEGATIVE 07/15/2017 1737   NITRITE NEGATIVE 07/15/2017 1737   LEUKOCYTESUR TRACE (A) 07/15/2017 1737   Sepsis Labs: @LABRCNTIP (procalcitonin:4,lacticidven:4)  )No results found for this or any previous visit (from the past 240 hour(s)).    Radiology Studies: Dg Chest 2 View  Result Date: 07/15/2017 CLINICAL DATA:  Subacute onset of generalized weakness and hypotension. Initial encounter. EXAM: CHEST  2 VIEW COMPARISON:  Chest radiograph performed 12/16/2012 FINDINGS: There is mild elevation of the right hemidiaphragm. Mild bilateral atelectasis is noted. No significant pleural effusion or pneumothorax is seen. The heart is mildly enlarged. The  patient is status post median sternotomy. A large hiatal hernia is noted. No acute osseous abnormalities are seen. IMPRESSION: 1. Mild elevation of the right hemidiaphragm. Mild bibasilar atelectasis. 2. Mild cardiomegaly. 3. Large hiatal hernia. Electronically Signed   By: Roanna RaiderJeffery  Chang M.D.   On: 07/15/2017 21:42   [place] Renal  Result Date: 07/16/2017 CLINICAL DATA:  Acute kidney injury. EXAM: RENAL / URINARY TRACT ULTRASOUND COMPLETE COMPARISON:  10/05/2016 abdominal ultrasound report (images not available at the time of this dictation) FINDINGS: Right Kidney: Length: 11.3 cm. Echogenicity within normal limits. No hydronephrosis. 1.2 cm minimally complex cyst containing some low-level internal echoes in the upper pole, unchanged in size from that reported on the prior ultrasound. Left Kidney: Length: 10.9 cm. Echogenicity within normal limits. No hydronephrosis. 1.3 cm minimally complex cyst containing some low-level internal echoes in the interpolar kidney, unchanged in size from the cyst described on the prior ultrasound. Bladder: Appears normal for degree of bladder distention. Partially visualized complex fluid adjacent to the liver with internal motion, right pleural effusion, and shadowing gallstone. IMPRESSION: 1. No hydronephrosis. 2.  Minimally complex bilateral renal cysts, unchanged in size from this reported on the prior ultrasound. 3. Partially visualized complex fluid adjacent to the liver. It is not clear whether this is within bowel or reflects complex ascites or a perihepatic fluid collection. Consider abdominal CT for further evaluation. 4. Right pleural effusion. 5. Cholelithiasis. Electronically Signed   By: Sebastian AcheAllen  Grady M.D.   On: 07/16/2017 10:46     Scheduled Meds: . allopurinol  300 mg Oral Daily  . aspirin EC  81 mg Oral Daily  . cholecalciferol  2,000 Units Oral BID  . ferrous sulfate  325 mg Oral BID  . heparin  5,000 Units Subcutaneous Q8H  . metoprolol succinate  12.5 mg Oral QODAY  . pantoprazole  40 mg Oral Daily   Continuous Infusions: . sodium chloride 125 mL/hr at 07/16/17 0955     LOS: 1 day    Time spent: 30 min    Renae FickleSHORT, Thayer Inabinet, MD [facility] Pager [phone]  If 7PM-7AM, please contact night-coverage [email or web address] Password [email or web address] 07/16/2017, 11:49 AM

## 2017-07-16 NOTE — Consult Note (Signed)
WOC Nurse wound consult note performed by remote telehealth camera with assistance from bedside nurse Reason for Consult: LE ulcerations.  Patient's wife at bedside, she reports that patient developed ulcerations about 2 weeks ago.  Does not recall trauma to the area, does not recall them be blisters at any point.  Patient has LE edema.  Had work up with dermatologist scheduled for today, of course will not be at this appointment. She reports they have drained at times.  Wound type: ? Venous stasis Pressure Injury POA: NA Measurement: see admission assessment measurement for each wound Wound bed: posterior and anterior wounds on the RLE 100% yellow/ superficial area lateral distal RLE is clean, pink Bedside nurse reports inner thigh does not have any yellow tissue Drainage (amount, consistency, odor) minimal Periwound: intact  Dressing procedure/placement/frequency: Topical enzymatic debridement for wounds with slough present. Silicone foam to the other open areas.  Would suggest ABI to determine if arterial perfusion is normal, then would be safe to compress with Unna's boots. And would allow local dermatologist to know patients vascular status at the time of eventual work up.  Discussed POC with patient and bedside nurse.  Re consult if needed, will not follow at this time. Thanks  Tyce Delcid M.D.[employer] MSN, RN,CWOCN, CNS, CWON-AP [phone])

## 2017-07-17 DIAGNOSIS — R188 Other ascites: Secondary | ICD-10-CM

## 2017-07-17 DIAGNOSIS — D649 Anemia, unspecified: Secondary | ICD-10-CM

## 2017-07-17 LAB — PROTEIN, URINE, 24 HOUR
Collection Interval-UPROT: 24 hours
Protein, 24H Urine: 210 mg/d — ABNORMAL HIGH (ref 50–100)
Protein, Urine: 35 mg/dL
URINE TOTAL VOLUME-UPROT: 600 mL

## 2017-07-17 LAB — ANTINUCLEAR ANTIBODIES, IFA: ANTINUCLEAR ANTIBODIES, IFA: NEGATIVE

## 2017-07-17 LAB — RENAL FUNCTION PANEL
Albumin: 1.9 g/dL — ABNORMAL LOW (ref 3.5–5.0)
Anion gap: 13 (ref 5–15)
BUN: 91 mg/dL — AB (ref 6–20)
CO2: 12 mmol/L — ABNORMAL LOW (ref 22–32)
CREATININE: 3.97 mg/dL — AB (ref 0.61–1.24)
Calcium: 8.5 mg/dL — ABNORMAL LOW (ref 8.9–10.3)
Chloride: 112 mmol/L — ABNORMAL HIGH (ref 101–111)
GFR, EST AFRICAN AMERICAN: 16 mL/min — AB (ref >60)
GFR, EST NON AFRICAN AMERICAN: 14 mL/min — AB (ref >60)
Glucose, Bld: 82 mg/dL (ref 65–99)
PHOSPHORUS: 7.5 mg/dL — AB (ref 2.5–4.6)
Potassium: 5.5 mmol/L — ABNORMAL HIGH (ref 3.5–5.1)
SODIUM: 137 mmol/L (ref 135–145)

## 2017-07-17 LAB — CBC
HCT: 21.2 % — ABNORMAL LOW (ref 39.0–52.0)
Hemoglobin: 7.2 g/dL — ABNORMAL LOW (ref 13.0–17.0)
MCH: 31.3 pg (ref 26.0–34.0)
MCHC: 34 g/dL (ref 30.0–36.0)
MCV: 92.2 fL (ref 78.0–100.0)
Platelets: 363 10*3/uL (ref 150–400)
RBC: 2.3 MIL/uL — AB (ref 4.22–5.81)
RDW: 13.3 % (ref 11.5–15.5)
WBC: 18.7 10*3/uL — AB (ref 4.0–10.5)

## 2017-07-17 LAB — HEPATITIS C ANTIBODY: HCV Ab: <0.1 s/co ratio (ref 0.0–0.9)

## 2017-07-17 LAB — MPO/PR-3 (ANCA) ANTIBODIES: Myeloperoxidase Abs: <9 U/mL (ref 0.0–9.0)

## 2017-07-17 LAB — PROTIME-INR
INR: 1.21
PROTHROMBIN TIME: 15.2 s (ref 11.4–15.2)

## 2017-07-17 LAB — HEMATOCRIT: HCT: 21.2 % — ABNORMAL LOW (ref 39.0–52.0)

## 2017-07-17 LAB — COMPLEMENT, TOTAL: COMPL TOTAL (CH50): 57 U/mL (ref >41)

## 2017-07-17 LAB — IRON AND TIBC: IRON: 28 ug/dL — AB (ref 45–182)

## 2017-07-17 LAB — TSH: TSH: 1.716 u[IU]/mL (ref 0.350–4.500)

## 2017-07-17 LAB — OCCULT BLOOD X 1 CARD TO LAB, STOOL: FECAL OCCULT BLD: POSITIVE — AB

## 2017-07-17 LAB — C4 COMPLEMENT: Complement C4, Body Fluid: 16 mg/dL (ref 14–44)

## 2017-07-17 LAB — C3 COMPLEMENT: C3 Complement: 70 mg/dL — ABNORMAL LOW (ref 82–167)

## 2017-07-17 LAB — FERRITIN: Ferritin: 934 ng/mL — ABNORMAL HIGH (ref 24–336)

## 2017-07-17 LAB — VITAMIN B12: Vitamin B-12: 3352 pg/mL — ABNORMAL HIGH (ref 180–914)

## 2017-07-17 LAB — FOLATE: Folate: 16.1 ng/mL (ref >5.9)

## 2017-07-17 MED ORDER — LORAZEPAM 1 MG PO TABS: 1.0000 mg | ORAL_TABLET | Freq: Four times a day (QID) | ORAL | Status: AC | PRN

## 2017-07-17 MED ORDER — FOLIC ACID 1 MG PO TABS: 1.0000 mg | ORAL_TABLET | Freq: Every day | ORAL | Status: DC

## 2017-07-17 MED ORDER — VITAMIN B-1 100 MG PO TABS
100.0000 mg | ORAL_TABLET | Freq: Every day | ORAL | Status: DC
  Administered 2017-07-22 – 2017-07-25 (×2): 100 mg via ORAL
  Filled 2017-07-17 (×3): qty 1

## 2017-07-17 MED ORDER — STERILE WATER FOR INJECTION IV SOLN
INTRAVENOUS | Status: DC
  Administered 2017-07-17 – 2017-07-20 (×5): via INTRAVENOUS
  Filled 2017-07-17 (×14): qty 850

## 2017-07-17 MED ORDER — HEPARIN SODIUM (PORCINE) 5000 UNIT/ML IJ SOLN: 5000.0000 [IU] | Freq: Three times a day (TID) | INTRAMUSCULAR | Status: DC

## 2017-07-17 MED ORDER — LORAZEPAM 2 MG/ML IJ SOLN
1.0000 mg | Freq: Four times a day (QID) | INTRAMUSCULAR | Status: AC | PRN
  Administered 2017-07-17: 1 mg via INTRAVENOUS
  Filled 2017-07-17: qty 1

## 2017-07-17 MED ORDER — PATIROMER SORBITEX CALCIUM 8.4 G PO PACK
8.4000 g | PACK | Freq: Every day | ORAL | Status: DC
  Administered 2017-07-17: 8.4 g via ORAL
  Filled 2017-07-17 (×2): qty 4

## 2017-07-17 MED ORDER — FUROSEMIDE 10 MG/ML IJ SOLN: 40.0000 mg | Freq: Once | INTRAMUSCULAR | Status: DC

## 2017-07-17 MED ORDER — HEPARIN SODIUM (PORCINE) 5000 UNIT/ML IJ SOLN
5000.0000 [IU] | Freq: Three times a day (TID) | INTRAMUSCULAR | Status: DC
  Administered 2017-07-18 – 2017-07-22 (×11): 5000 [IU] via SUBCUTANEOUS
  Filled 2017-07-17 (×11): qty 1

## 2017-07-17 MED ORDER — FUROSEMIDE 10 MG/ML IJ SOLN
100.0000 mg | Freq: Two times a day (BID) | INTRAVENOUS | Status: DC
  Administered 2017-07-17 – 2017-07-18 (×3): 100 mg via INTRAVENOUS
  Filled 2017-07-17 (×5): qty 10

## 2017-07-17 MED ORDER — THIAMINE HCL 100 MG/ML IJ SOLN
100.0000 mg | Freq: Every day | INTRAMUSCULAR | Status: DC
  Administered 2017-07-17 – 2017-07-23 (×6): 100 mg via INTRAVENOUS
  Filled 2017-07-17 (×6): qty 2

## 2017-07-17 MED ORDER — ADULT MULTIVITAMIN W/MINERALS CH
1.0000 | ORAL_TABLET | Freq: Every day | ORAL | Status: DC
  Administered 2017-07-17 – 2017-07-25 (×4): 1 via ORAL
  Filled 2017-07-17 (×6): qty 1

## 2017-07-17 NOTE — Evaluation (Addendum)
Physical Therapy Evaluation Patient Details Name: Jonathan Walker MRN: [id number] DOB: 04/28/1946 Today's Date: 07/17/2017   History of Present Illness  Jonathan Walker  is a 71 y.o. male, history of CAD status post CABG, hypertension, hyperlipidemia, gout who was brought to hospital for progressive weakness over the past few weeks.  As per the patient's wife, patient's dose of Lasix was changed a month ago.  Previously he was taking Lasix every other day but changed Lasix to every day due to lower extremity edema.    Clinical Impression  Patient demonstrates slow labored functional mobility and gait with most difficulty for sit to stands due to BLE weakness.  Patient had heavy breathing after walking in hallway and put back to bed.  Patient will benefit from continued physical therapy in hospital and recommended venue below to increase strength, balance, endurance for safe ADLs and gait.    Follow Up Recommendations SNF;Supervision/Assistance - 24 hour    Equipment Recommendations       Recommendations for Other Services       Precautions / Restrictions Precautions Precautions: Fall Restrictions Weight Bearing Restrictions: No      Mobility  Bed Mobility Overal bed mobility: Needs Assistance Bed Mobility: Supine to Sit;Sit to Supine     Supine to sit: Min assist Sit to supine: Min assist      Transfers Overall transfer level: Needs assistance Equipment used: Rolling walker (2 wheeled) Transfers: Sit to/from [employer] Sit to Stand: Mod assist Stand pivot transfers: Min assist;Mod assist          Ambulation/Gait  Assist level: Min assist Ambulation Distance (Feet): 25 Feet Assistive device: Rolling walker (2 wheeled) Gait Pattern/deviations: Decreased step length - right;Decreased step length - left;Decreased stride length   Gait velocity interpretation: Below normal speed for age/gender General Gait Details: Patient demonstrates slow labored unsteady  cadence with short step/stride length that became shorter once fatigued  Stairs            Wheelchair Mobility    Modified [name] (Stroke Patients Only)       Balance Overall balance assessment: Needs assistance Sitting-balance support: Feet supported;Bilateral upper extremity supported Sitting balance-Leahy Scale: Fair     Standing balance support: Bilateral upper extremity supported;During functional activity Standing balance-Leahy Scale: Fair                               Pertinent Vitals/Pain Pain Assessment: No/denies pain    Home Living Family/patient expects to be discharged to:: Private residence Living Arrangements: Spouse/significant other Available Help at Discharge: Family Type of Home: House Home Access: Stairs to enter Entrance Stairs-Rails: None Entrance Stairs-Number of Steps: 1 Home Layout: One level Home Equipment: Environmental consultant - 4 wheels;Cane - single point;Shower seat      Prior Function Level of Independence: Independent         Comments: Was able to ambulate without assistive device, but last 2-3 weeks started using SPC     Hand Dominance        Extremity/Trunk Assessment   Upper Extremity Assessment Upper Extremity Assessment: Generalized weakness    Lower Extremity Assessment Lower Extremity Assessment: Generalized weakness    Cervical / Trunk Assessment Cervical / Trunk Assessment: Kyphotic  Communication   Communication: No difficulties  Cognition Arousal/Alertness: Awake/alert Behavior During Therapy: WFL for tasks assessed/performed Overall Cognitive Status: Within Functional Limits for tasks assessed  General Comments      Exercises     Assessment/Plan    PT Assessment Patient needs continued PT services  PT Problem List Decreased strength;Decreased activity tolerance;Decreased balance;Decreased mobility       PT Treatment Interventions Gait  training;Functional mobility training;Therapeutic activities;Therapeutic exercise;Patient/family education    PT Goals (Current goals can be found in the Care Plan section)  Acute Rehab PT Goals Patient Stated Goal: return home PT Goal Formulation: With patient/family Time For Goal Achievement: 08/06/17 Potential to Achieve Goals: Good    Frequency Min 3X/week   Barriers to discharge        Co-evaluation               AM-PAC PT "6 Clicks" Daily Activity  Outcome Measure Difficulty turning over in bed (including adjusting bedclothes, sheets and blankets)?: A Little Difficulty moving from lying on back to sitting on the side of the bed? : A Lot Difficulty sitting down on and standing up from a chair with arms (e.g., wheelchair, bedside commode, etc,.)?: A Little Help needed moving to and from a bed to chair (including a wheelchair)?: A Lot Help needed walking in hospital room?: A Lot Help needed climbing 3-5 steps with a railing? : A Lot 6 Click Score: 14    End of Session Equipment Utilized During Treatment: Gait belt Activity Tolerance: Patient limited by fatigue Patient left: in bed;with call bell/phone within reach;with family/visitor present Nurse Communication: Mobility status PT Visit Diagnosis: Unsteadiness on feet (R26.81);Other abnormalities of gait and mobility (R26.89);Muscle weakness (generalized) (M62.81)    Time: [id number] PT Time Calculation (min) (ACUTE ONLY): 31 min   Charges:   PT Evaluation $PT Eval Moderate Complexity: 1 Mod PT Treatments $Therapeutic Activity: 23-37 mins   PT G Codes:   PT G-Codes **NOT FOR INPATIENT CLASS** Functional Assessment Tool Used: AM-PAC 6 Clicks Basic Mobility Functional Limitation: Mobility: Walking and moving around Mobility: Walking and Moving Around Current Status ([id number]([id number]): At least 40 percent but less than 60 percent impaired, limited or restricted Mobility: Walking and Moving Around Goal Status [phone](G8979): At  least 40 percent but less than 60 percent impaired, limited or restricted Mobility: Walking and Moving Around Discharge Status [phone](G8980): At least 40 percent but less than 60 percent impaired, limited or restricted    1:17 PM, 07/17/17 Ocie BobJames Lovey Crupi, MPT Physical Therapist with [facility] [phone] office [phone] mobile phone

## 2017-07-17 NOTE — Progress Notes (Signed)
[facility]. Report called to Al, Charity fundraiserN. Pt transported via [facility].

## 2017-07-17 NOTE — Progress Notes (Signed)
Subjective: Interval History: has no complaint of nausea or vomiting.  Patient also denies any difficulty breathing..  Objective: Vital signs in last 24 hours: Temp:  [98.2 F (36.8 C)-98.6 F (37 C)] 98.6 F (37 C) (10/23 0552) Pulse Rate:  [54-98] 54 (10/23 0552) Resp:  [18] 18 (10/23 0552) BP: (80-132)/(62-63) 132/63 (10/23 0552) SpO2:  [95 %-99 %] 98 % (10/23 0552) Weight change:   Intake/Output from previous day: 10/22 0701 - 10/23 0700 In: 1942.1 [P.O.:240; I.V.:1702.1] Out: -  Intake/Output this shift: No intake/output data recorded.  General appearance: alert, cooperative and no distress Resp: diminished breath sounds bilaterally Cardio: regular rate and rhythm Extremities: Trace edema  Lab Results:  Recent Labs  07/16/17 0423 07/17/17 0425  WBC 22.4* 18.7*  HGB 7.9* 7.2*  HCT 22.7* 21.2*  PLT 387 363   BMET:  Recent Labs  07/16/17 0423 07/17/17 0425  NA 135 137  K 5.1 5.5*  CL 108 112*  CO2 14* 12*  GLUCOSE 75 82  BUN 91* 91*  CREATININE 3.83* 3.97*  CALCIUM 8.4* 8.5*   No results for input(s): PTH in the last 72 hours. Iron Studies: No results for input(s): IRON, TIBC, TRANSFERRIN, FERRITIN in the last 72 hours.  Studies/Results: Ct Abdomen Pelvis Wo Contrast  Result Date: 07/16/2017 CLINICAL DATA:  Hematuria of unknown cause EXAM: CT ABDOMEN AND PELVIS WITHOUT CONTRAST TECHNIQUE: Multidetector CT imaging of the abdomen and pelvis was performed following the standard protocol without IV contrast. COMPARISON:  07/16/2017 FINDINGS: Lower chest: Lung bases demonstrate mild dependent atelectasis. Small right-sided pleural effusion. Coronary calcifications. Heart size upper limits of normal. Hepatobiliary: Nodular liver contour suspect for cirrhosis. Calcified gallstone. No biliary dilatation. Pancreas: Unremarkable. No pancreatic ductal dilatation or surrounding inflammatory changes. Spleen: Normal in size without focal abnormality. Probable accessory  splenule posterior to the spleen. Adrenals/Urinary Tract: Adrenal glands are within normal limits. No hydronephrosis. Urinary bladder slightly thick walled but underdistended Stomach/Bowel: Stomach nonenlarged. No dilated small bowel. No colon wall thickening. Normal appendix. Sigmoid colon diverticular disease without acute inflammation. Vascular/Lymphatic: Aortic atherosclerosis. No aneurysmal dilatation. No significantly enlarged abdominal or pelvic lymph nodes. Reproductive: Prostate is unremarkable. Other: Small amount of ascites in the pelvis. Large Terry hepatic gas and fluid collection measuring 16 cm AP by 3.6 cm thick by 21.6 cm cranial caudad. It is probably contiguous with an additional gas and fluid collection in the right lower quadrant measuring 11 cm. No significant rim enhancement. Some mass effect on the adjacent liver. No free air is seen. Musculoskeletal: Scoliosis and degenerative changes of the spine. Post sternotomy changes. No acute or suspicious bone lesion. IMPRESSION: 1. Large perihepatic gas and fluid collection that probably communicates with an additional large gas and fluid collection in the right lower quadrant of the abdomen. Differential considerations include loculated ascites with superimposed gas-forming infection, possible contained perforation although no diseased segments of large or small bowel are seen, and given the somewhat subcapsular appearance superiorly, old hematoma, possibly with infection. 2. Nodular contour of the liver suggests cirrhosis 3. Small moderate pelvic ascites 4. Sigmoid colon diverticular disease without acute inflammation 5. Gallstone Electronically Signed   By: Jasmine Pang M.D.   On: 07/16/2017 20:02   Dg Chest 2 View  Result Date: 07/15/2017 CLINICAL DATA:  Subacute onset of generalized weakness and hypotension. Initial encounter. EXAM: CHEST  2 VIEW COMPARISON:  Chest radiograph performed 12/16/2012 FINDINGS: There is mild elevation of the  right hemidiaphragm. Mild bilateral atelectasis is noted. No significant  pleural effusion or pneumothorax is seen. The heart is mildly enlarged. The patient is status post median sternotomy. A large hiatal hernia is noted. No acute osseous abnormalities are seen. IMPRESSION: 1. Mild elevation of the right hemidiaphragm. Mild bibasilar atelectasis. 2. Mild cardiomegaly. 3. Large hiatal hernia. Electronically Signed   By: Roanna RaiderJeffery  Chang M.D.   On: 07/15/2017 21:42   [place] Renal  Result Date: 07/16/2017 CLINICAL DATA:  Acute kidney injury. EXAM: RENAL / URINARY TRACT ULTRASOUND COMPLETE COMPARISON:  10/05/2016 abdominal ultrasound report (images not available at the time of this dictation) FINDINGS: Right Kidney: Length: 11.3 cm. Echogenicity within normal limits. No hydronephrosis. 1.2 cm minimally complex cyst containing some low-level internal echoes in the upper pole, unchanged in size from that reported on the prior ultrasound. Left Kidney: Length: 10.9 cm. Echogenicity within normal limits. No hydronephrosis. 1.3 cm minimally complex cyst containing some low-level internal echoes in the interpolar kidney, unchanged in size from the cyst described on the prior ultrasound. Bladder: Appears normal for degree of bladder distention. Partially visualized complex fluid adjacent to the liver with internal motion, right pleural effusion, and shadowing gallstone. IMPRESSION: 1. No hydronephrosis. 2. Minimally complex bilateral renal cysts, unchanged in size from this reported on the prior ultrasound. 3. Partially visualized complex fluid adjacent to the liver. It is not clear whether this is within bowel or reflects complex ascites or a perihepatic fluid collection. Consider abdominal CT for further evaluation. 4. Right pleural effusion. 5. Cholelithiasis. Electronically Signed   By: Sebastian AcheAllen  Grady M.D.   On: 07/16/2017 10:46   [place] Arterial Abi (screening Lower Extremity)  Result Date: 07/16/2017 CLINICAL DATA:   71 year old male with lower extremity ulceration and edema. EXAM: NONINVASIVE PHYSIOLOGIC VASCULAR STUDY OF BILATERAL LOWER EXTREMITIES TECHNIQUE: Evaluation of both lower extremities were performed at rest, including calculation of ankle-brachial indices with single level Doppler, pressure and pulse volume recording. COMPARISON:  None. FINDINGS: Right ABI:  1.2 Left ABI:  1.2 Right Lower Extremity:  Normal arterial waveforms at the ankle. Left Lower Extremity:  Normal arterial waveforms at the ankle. IMPRESSION: Normal bilateral resting ankle-brachial indices and arterial waveforms. Electronically Signed   By: Malachy MoanHeath  McCullough M.D.   On: 07/16/2017 13:38     I have reviewed the patient's current medications.  Assessment/Plan: Problem #1 renal failure: Acute versus chronic.  Presently his creatinine is stable there is no sign of improvement.  Patient however is nonoliguric.  Etiology could be secondary to prerenal syndrome versus ATN. Problem #2 hyperkalemia: His potassium is increasing Problem #3 low CO2 possibly metabolic Problem #4 hypertension: His blood pressure is reasonably controlled Problem #5 history of gout Problem #6 history of coronary artery disease Problem #7 cellulitis of his leg: Presently he is on antibiotics.  CT scan of the abdomen showed perihepatic gas of unknown etiology. Plan: We will change his IV fluid to half normal with sodium bicarbonate 2] will increase Lasix 100 mg IV twice daily [place]] we will start patient on Valtessa 8.4 gm once a day For] we will check his renal panel in the morning   LOS: 2 days   Crosby Oriordan S 07/17/2017,8:09 AM

## 2017-07-17 NOTE — Progress Notes (Addendum)
PROGRESS NOTE  Jonathan Walker  ZOX:[id number] DOB: 1946-09-23 DOA: 07/15/2017 PCP: Samuel Jester, DO  Brief Narrative:   Patient is a 71 year old CAD s/p CABG, hypertension, hyperlipidemia, gout who resents with weakness and was found to have AKI.  His lasix was increased from every other day to daily dosing about 1 month ago.  For the last few days, however, he has not made very much urine output and he has become very fatigued.  Additionally, he was seen by dermatology, Dr. Margo Aye this month for some new right lower extremity ulcers.  They were biopsied and biopsy demonstrated no evidence of vasculitis, but sample was superficial and nonspecific.  He was diagnosed with polyarteritis nodosum and given keflex and prednisone taper which have helped his ulcers.  He had a weakly positive ANA and CANCA, both titers were 1:80 earlier this month.      01/30/2017 Creatinine 0.9 06/01/17 Creatinine 1.6 06/25/17 Creatinine 1.38  Assessment & Plan:   Active Problems:   AKI (acute kidney injury) (HCC)   Polyarteritis nodosa (HCC)   Normocytic anemia  Acute kidney injury with microscopic hematuria/evidence of possible nephritis.  Baseline creatinine is 0.9 on May 8th, 2018.  On  and at that time his lasix dose was increased.  Creatinine 1.38 on Oct 1st.  Possible this may all be due to ATN from dehydration/overdiuresis, but autoimmune disorder also possible.  Potassium rising and CO2 declining -  Nephrology consultation appreciated -  ANA has been repeated -  Mpo/pr-3 ANCA ab pending -  Complement:  C3 70 (Low), CH 50 and C4 wnl -  HCV negative and HBV pending -  HIV with morning labs -  UPEP being collected -  Change to bicarb containing IVF -  Start lasix for hyperkalemia -  patiromer increased by nephrology -  Avoid Kayexelate in setting of occult positive stool and unusual abdominal fluid collection -  Renal US:  Minimally complex bilateral renal cysts, unchanged from prior -  May need renal  biopsy  Incidental finding of possible complex fluid adjacent to the liver:  May be infected ascites given suggestion of cirrhosis versus blood versus contained bowel perforation -  CT scan:  Large fluid collection around the liver extending into the right lower quadrant with air fluid level -  Transfer to [facility] for IR paracentesis and drain placement -  Ordered paracentesis labs with cytology and a hematocrit (in case it's bloody)  Coronary artery disease, stable -  Continue aspirin and beta blocker -  Not on statin  Gout, stable, continue allopurinol  Hypertension, blood pressure low normal -  Hold parameter for metoprolol  Leg ulcers, possible polyarteritis nodosa, improved with antibiotics and steroids -  Good pedal pulse -  ABI wnl -  Appreciate wound care:  Santyl with dry dressings daily  Leukocytosis likely due to steroids  Anemia of inflammation and possible renal disease and slow GI losses.  Hemoglobin continuing to decline which may be hemodilutional but also consider blood loss around liver? -  Iron studies suggest inflammation, B12 HIGH, folate wnl, SPEP pending -  TSH 1.76 -  Occult stool POSITIVE  DVT prophylaxis:  heparin Code Status:  DNR Family Communication:  Patient and his wife Disposition Plan:  [facility]   Consultants:   Nephrology  Procedures:  Renal US  Antimicrobials:  Anti-infectives    None       Subjective:  Denies chest pains, SOB, nausea, vomiting, abdominal pains at  rest.  Wife notices he is breathing harder today.   Objective: Vitals:   07/16/17 1400 07/16/17 2017 07/16/17 2106 07/17/17 0552  BP: 112/62  (!) 80/63 132/63  Pulse: 93  98 (!) 54  Resp: 18  18 18   Temp: 98.2 F (36.8 C)  98.6 F (37 C) 98.6 F (37 C)  TempSrc: Oral  Oral Oral  SpO2: 99% 95% 98% 98%  Weight:      Height:        Intake/Output Summary (Last 24 hours) at 07/17/17 1248 Last data filed at 07/17/17 0800  Gross  per 24 hour  Intake             1120 ml  Output                0 ml  Net             1120 ml   Filed Weights   07/15/17 1707 07/15/17 2300  Weight: 71.7 kg (158 lb) 70.9 kg (156 lb 4.9 oz)    Examination:  General exam:  Adult male.  No acute distress.  HEENT:  NCAT, MMM Respiratory system: wheezing, no rales or rhonchi Cardiovascular system: Regular rate and rhythm, possible gallop and systolic murmur.  Warm extremities Gastrointestinal system: Normal active bowel sounds, soft, nondistended, mild TTP in the RUQ and along right abdominal wall without rebound or guarding  MSK:  Normal tone and bulk, 1+ pitting bilateral lower extremity edema.  Derm:  RLE:  Did not observe ulcers today, recently dressed Neuro:  Grossly moves all extremities    Data Reviewed: I have personally reviewed following labs and imaging studies  CBC:  Recent Labs Lab 07/15/17 1833 07/16/17 0423 07/17/17 0425 07/17/17 1041  WBC 26.2* 22.4* 18.7*  --   NEUTROABS 23.7*  --   --   --   HGB 8.4* 7.9* 7.2*  --   HCT 24.4* 22.7* 21.2* 21.2*  MCV 91.4 91.9 92.2  --   PLT 415* 387 363  --    Basic Metabolic Panel:  Recent Labs Lab 07/15/17 1833 07/16/17 0423 07/17/17 0425  NA 133* 135 137  K 5.6* 5.1 5.5*  CL 105 108 112*  CO2 15* 14* 12*  GLUCOSE 115* 75 82  BUN 93* 91* 91*  CREATININE 3.90* 3.83* 3.97*  CALCIUM 8.9 8.4* 8.5*  PHOS  --   --  7.5*   GFR: Estimated Creatinine Clearance: 17.1 mL/min (A) (by C-G formula based on SCr of 3.97 mg/dL (H)). Liver Function Tests:  Recent Labs Lab 07/15/17 1833 07/16/17 0423 07/17/17 0425  AST 22 19  --   ALT 17 15*  --   ALKPHOS 90 82  --   BILITOT 0.5 0.4  --   PROT 5.8* 5.3*  --   ALBUMIN 2.2* 2.0* 1.9*    Recent Labs Lab 07/15/17 1833  LIPASE 41   No results for input(s): AMMONIA in the last 168 hours. Coagulation Profile:  Recent Labs Lab 07/17/17 1041  INR 1.21   Cardiac Enzymes:  Recent Labs Lab 07/15/17 1833    TROPONINI <0.03   BNP (last 3 results) No results for input(s): PROBNP in the last 8760 hours. HbA1C: No results for input(s): HGBA1C in the last 72 hours. CBG: No results for input(s): GLUCAP in the last 168 hours. Lipid Profile: No results for input(s): CHOL, HDL, LDLCALC, TRIG, CHOLHDL, LDLDIRECT in the last 72 hours. Thyroid Function Tests:  Recent Labs  07/17/17 0425  TSH  1.716   Anemia Panel: No results for input(s): VITAMINB12, FOLATE, FERRITIN, TIBC, IRON, RETICCTPCT in the last 72 hours. Urine analysis:    Component Value Date/Time   COLORURINE YELLOW 07/15/2017 1737   APPEARANCEUR HAZY (A) 07/15/2017 1737   LABSPEC 1.011 07/15/2017 1737   PHURINE 5.0 07/15/2017 1737   GLUCOSEU NEGATIVE 07/15/2017 1737   HGBUR LARGE (A) 07/15/2017 1737   BILIRUBINUR NEGATIVE 07/15/2017 1737   KETONESUR NEGATIVE 07/15/2017 1737   PROTEINUR NEGATIVE 07/15/2017 1737   NITRITE NEGATIVE 07/15/2017 1737   LEUKOCYTESUR TRACE (A) 07/15/2017 1737   Sepsis Labs: @LABRCNTIP (procalcitonin:4,lacticidven:4)  )No results found for this or any previous visit (from the past 240 hour(s)).    Radiology Studies: Ct Abdomen Pelvis Wo Contrast  Result Date: 07/16/2017 CLINICAL DATA:  Hematuria of unknown cause EXAM: CT ABDOMEN AND PELVIS WITHOUT CONTRAST TECHNIQUE: Multidetector CT imaging of the abdomen and pelvis was performed following the standard protocol without IV contrast. COMPARISON:  07/16/2017 FINDINGS: Lower chest: Lung bases demonstrate mild dependent atelectasis. Small right-sided pleural effusion. Coronary calcifications. Heart size upper limits of normal. Hepatobiliary: Nodular liver contour suspect for cirrhosis. Calcified gallstone. No biliary dilatation. Pancreas: Unremarkable. No pancreatic ductal dilatation or surrounding inflammatory changes. Spleen: Normal in size without focal abnormality. Probable accessory splenule posterior to the spleen. Adrenals/Urinary Tract: Adrenal  glands are within normal limits. No hydronephrosis. Urinary bladder slightly thick walled but underdistended Stomach/Bowel: Stomach nonenlarged. No dilated small bowel. No colon wall thickening. Normal appendix. Sigmoid colon diverticular disease without acute inflammation. Vascular/Lymphatic: Aortic atherosclerosis. No aneurysmal dilatation. No significantly enlarged abdominal or pelvic lymph nodes. Reproductive: Prostate is unremarkable. Other: Small amount of ascites in the pelvis. Large Terry hepatic gas and fluid collection measuring 16 cm AP by 3.6 cm thick by 21.6 cm cranial caudad. It is probably contiguous with an additional gas and fluid collection in the right lower quadrant measuring 11 cm. No significant rim enhancement. Some mass effect on the adjacent liver. No free air is seen. Musculoskeletal: Scoliosis and degenerative changes of the spine. Post sternotomy changes. No acute or suspicious bone lesion. IMPRESSION: 1. Large perihepatic gas and fluid collection that probably communicates with an additional large gas and fluid collection in the right lower quadrant of the abdomen. Differential considerations include loculated ascites with superimposed gas-forming infection, possible contained perforation although no diseased segments of large or small bowel are seen, and given the somewhat subcapsular appearance superiorly, old hematoma, possibly with infection. 2. Nodular contour of the liver suggests cirrhosis 3. Small moderate pelvic ascites 4. Sigmoid colon diverticular disease without acute inflammation 5. Gallstone Electronically Signed   By: Jasmine PangKim  Fujinaga M.D.   On: 07/16/2017 20:02   Dg Chest 2 View  Result Date: 07/15/2017 CLINICAL DATA:  Subacute onset of generalized weakness and hypotension. Initial encounter. EXAM: CHEST  2 VIEW COMPARISON:  Chest radiograph performed 12/16/2012 FINDINGS: There is mild elevation of the right hemidiaphragm. Mild bilateral atelectasis is noted. No  significant pleural effusion or pneumothorax is seen. The heart is mildly enlarged. The patient is status post median sternotomy. A large hiatal hernia is noted. No acute osseous abnormalities are seen. IMPRESSION: 1. Mild elevation of the right hemidiaphragm. Mild bibasilar atelectasis. 2. Mild cardiomegaly. 3. Large hiatal hernia. Electronically Signed   By: Roanna RaiderJeffery  Chang M.D.   On: 07/15/2017 21:42   [place] Renal  Result Date: 07/16/2017 CLINICAL DATA:  Acute kidney injury. EXAM: RENAL / URINARY TRACT ULTRASOUND COMPLETE COMPARISON:  10/05/2016 abdominal ultrasound report (images not available  at the time of this dictation) FINDINGS: Right Kidney: Length: 11.3 cm. Echogenicity within normal limits. No hydronephrosis. 1.2 cm minimally complex cyst containing some low-level internal echoes in the upper pole, unchanged in size from that reported on the prior ultrasound. Left Kidney: Length: 10.9 cm. Echogenicity within normal limits. No hydronephrosis. 1.3 cm minimally complex cyst containing some low-level internal echoes in the interpolar kidney, unchanged in size from the cyst described on the prior ultrasound. Bladder: Appears normal for degree of bladder distention. Partially visualized complex fluid adjacent to the liver with internal motion, right pleural effusion, and shadowing gallstone. IMPRESSION: 1. No hydronephrosis. 2. Minimally complex bilateral renal cysts, unchanged in size from this reported on the prior ultrasound. 3. Partially visualized complex fluid adjacent to the liver. It is not clear whether this is within bowel or reflects complex ascites or a perihepatic fluid collection. Consider abdominal CT for further evaluation. 4. Right pleural effusion. 5. Cholelithiasis. Electronically Signed   By: Sebastian Ache M.D.   On: 07/16/2017 10:46   US Arterial Abi (screening Lower Extremity)  Result Date: 07/16/2017 CLINICAL DATA:  71 year old male with lower extremity ulceration and edema. EXAM:  NONINVASIVE PHYSIOLOGIC VASCULAR STUDY OF BILATERAL LOWER EXTREMITIES TECHNIQUE: Evaluation of both lower extremities were performed at rest, including calculation of ankle-brachial indices with single level Doppler, pressure and pulse volume recording. COMPARISON:  None. FINDINGS: Right ABI:  1.2 Left ABI:  1.2 Right Lower Extremity:  Normal arterial waveforms at the ankle. Left Lower Extremity:  Normal arterial waveforms at the ankle. IMPRESSION: Normal bilateral resting ankle-brachial indices and arterial waveforms. Electronically Signed   By: Malachy Moan M.D.   On: 07/16/2017 13:38     Scheduled Meds: . [START ON 07/18/2017] allopurinol  300 mg Oral QODAY  . aspirin EC  81 mg Oral Daily  . cholecalciferol  2,000 Units Oral BID  . collagenase   Topical Daily  . ferrous sulfate  325 mg Oral BID  . [START ON 07/18/2017] heparin  5,000 Units Subcutaneous Q8H  . metoprolol succinate  12.5 mg Oral Daily  . pantoprazole  40 mg Oral Daily  . patiromer  8.4 g Oral Daily   Continuous Infusions: . furosemide 100 mg (07/17/17 1023)  .  sodium bicarbonate (isotonic) infusion in sterile water 100 mL/hr at 07/17/17 1023     LOS: 2 days    Time spent: 30 min    Renae Fickle, MD [facility] Pager [phone]  If 7PM-7AM, please contact night-coverage [email or web address] Password [email or web address] 07/17/2017, 12:48 PM

## 2017-07-18 ENCOUNTER — Inpatient Hospital Stay (HOSPITAL_COMMUNITY): Payer: Medicare Other

## 2017-07-18 ENCOUNTER — Encounter (HOSPITAL_COMMUNITY): Payer: Self-pay | Admitting: General Surgery

## 2017-07-18 DIAGNOSIS — I2510 Atherosclerotic heart disease of native coronary artery without angina pectoris: Secondary | ICD-10-CM

## 2017-07-18 DIAGNOSIS — F1021 Alcohol dependence, in remission: Secondary | ICD-10-CM

## 2017-07-18 DIAGNOSIS — E7220 Disorder of urea cycle metabolism, unspecified: Secondary | ICD-10-CM

## 2017-07-18 DIAGNOSIS — M109 Gout, unspecified: Secondary | ICD-10-CM

## 2017-07-18 DIAGNOSIS — R400 Somnolence: Secondary | ICD-10-CM

## 2017-07-18 DIAGNOSIS — L97929 Non-pressure chronic ulcer of unspecified part of left lower leg with unspecified severity: Secondary | ICD-10-CM

## 2017-07-18 DIAGNOSIS — G9340 Encephalopathy, unspecified: Secondary | ICD-10-CM

## 2017-07-18 DIAGNOSIS — K651 Peritoneal abscess: Secondary | ICD-10-CM

## 2017-07-18 DIAGNOSIS — L97919 Non-pressure chronic ulcer of unspecified part of right lower leg with unspecified severity: Secondary | ICD-10-CM

## 2017-07-18 DIAGNOSIS — I10 Essential (primary) hypertension: Secondary | ICD-10-CM

## 2017-07-18 DIAGNOSIS — L97909 Non-pressure chronic ulcer of unspecified part of unspecified lower leg with unspecified severity: Secondary | ICD-10-CM

## 2017-07-18 LAB — PROTEIN ELECTROPHORESIS, SERUM
A/G Ratio: 0.7 (ref 0.7–1.7)
ALPHA-1-GLOBULIN: 0.4 g/dL (ref 0.0–0.4)
Albumin ELP: 1.9 g/dL — ABNORMAL LOW (ref 2.9–4.4)
Alpha-2-Globulin: 0.7 g/dL (ref 0.4–1.0)
Beta Globulin: 0.8 g/dL (ref 0.7–1.3)
GLOBULIN, TOTAL: 2.9 g/dL (ref 2.2–3.9)
Gamma Globulin: 0.9 g/dL (ref 0.4–1.8)
M-Spike, %: 0.3 g/dL — ABNORMAL HIGH
TOTAL PROTEIN ELP: 4.8 g/dL — AB (ref 6.0–8.5)

## 2017-07-18 LAB — URINALYSIS, ROUTINE W REFLEX MICROSCOPIC
Bilirubin Urine: NEGATIVE
GLUCOSE, UA: NEGATIVE mg/dL
Ketones, ur: 5 mg/dL — AB
NITRITE: NEGATIVE
PH: 5 (ref 5.0–8.0)
PROTEIN: NEGATIVE mg/dL
Specific Gravity, Urine: 1.011 (ref 1.005–1.030)

## 2017-07-18 LAB — PROTIME-INR
INR: 1.21
Prothrombin Time: 15.2 seconds (ref 11.4–15.2)

## 2017-07-18 LAB — CBC WITH DIFFERENTIAL/PLATELET
BASOS ABS: 0 10*3/uL (ref 0.0–0.1)
Basophils Relative: 0 %
EOS ABS: 0 10*3/uL (ref 0.0–0.7)
Eosinophils Relative: 0 %
HEMATOCRIT: 26 % — AB (ref 39.0–52.0)
Hemoglobin: 8.8 g/dL — ABNORMAL LOW (ref 13.0–17.0)
LYMPHS ABS: 1 10*3/uL (ref 0.7–4.0)
Lymphocytes Relative: 8 %
MCH: 30.2 pg (ref 26.0–34.0)
MCHC: 33.8 g/dL (ref 30.0–36.0)
MCV: 89.3 fL (ref 78.0–100.0)
MONOS PCT: 12 %
Monocytes Absolute: 1.6 10*3/uL — ABNORMAL HIGH (ref 0.1–1.0)
Neutro Abs: 10.5 10*3/uL — ABNORMAL HIGH (ref 1.7–7.7)
Neutrophils Relative %: 80 %
Platelets: 368 10*3/uL (ref 150–400)
RBC: 2.91 MIL/uL — AB (ref 4.22–5.81)
RDW: 13.3 % (ref 11.5–15.5)
WBC Morphology: INCREASED
WBC: 13.1 10*3/uL — AB (ref 4.0–10.5)

## 2017-07-18 LAB — SAVE SMEAR

## 2017-07-18 LAB — COMPREHENSIVE METABOLIC PANEL
ALBUMIN: 2 g/dL — AB (ref 3.5–5.0)
ALK PHOS: 80 U/L (ref 38–126)
ALT: 16 U/L — AB (ref 17–63)
ANION GAP: 18 — AB (ref 5–15)
AST: 25 U/L (ref 15–41)
BILIRUBIN TOTAL: 1.5 mg/dL — AB (ref 0.3–1.2)
BUN: 101 mg/dL — AB (ref 6–20)
CALCIUM: 8.8 mg/dL — AB (ref 8.9–10.3)
CO2: 14 mmol/L — AB (ref 22–32)
CREATININE: 4.29 mg/dL — AB (ref 0.61–1.24)
Chloride: 109 mmol/L (ref 101–111)
GFR calc Af Amer: 15 mL/min — ABNORMAL LOW (ref >60)
GFR calc non Af Amer: 13 mL/min — ABNORMAL LOW (ref >60)
GLUCOSE: 84 mg/dL (ref 65–99)
Potassium: 4.8 mmol/L (ref 3.5–5.1)
SODIUM: 141 mmol/L (ref 135–145)
TOTAL PROTEIN: 5.8 g/dL — AB (ref 6.5–8.1)

## 2017-07-18 LAB — MRSA PCR SCREENING: MRSA by PCR: NEGATIVE

## 2017-07-18 LAB — URINALYSIS, MICROSCOPIC (REFLEX)

## 2017-07-18 LAB — APTT: APTT: 33 s (ref 24–36)

## 2017-07-18 LAB — AMMONIA: Ammonia: 52 umol/L — ABNORMAL HIGH (ref 9–35)

## 2017-07-18 LAB — MAGNESIUM: MAGNESIUM: 2 mg/dL (ref 1.7–2.4)

## 2017-07-18 LAB — PHOSPHORUS: PHOSPHORUS: 6.4 mg/dL — AB (ref 2.5–4.6)

## 2017-07-18 LAB — LACTATE DEHYDROGENASE: LDH: 131 U/L (ref 98–192)

## 2017-07-18 LAB — HIV ANTIBODY (ROUTINE TESTING W REFLEX): HIV SCREEN 4TH GENERATION: NONREACTIVE

## 2017-07-18 MED ORDER — CHLORHEXIDINE GLUCONATE 0.12 % MT SOLN
15.0000 mL | Freq: Two times a day (BID) | OROMUCOSAL | Status: DC
  Administered 2017-07-18 – 2017-07-25 (×11): 15 mL via OROMUCOSAL
  Filled 2017-07-18 (×9): qty 15

## 2017-07-18 MED ORDER — FENTANYL CITRATE (PF) 100 MCG/2ML IJ SOLN
INTRAMUSCULAR | Status: AC
  Filled 2017-07-18: qty 2

## 2017-07-18 MED ORDER — SODIUM CHLORIDE 0.9% FLUSH
5.0000 mL | Freq: Three times a day (TID) | INTRAVENOUS | Status: DC
  Administered 2017-07-18 – 2017-07-25 (×17): 5 mL via INTRAVENOUS

## 2017-07-18 MED ORDER — PIPERACILLIN-TAZOBACTAM IN DEX 2-0.25 GM/50ML IV SOLN
2.2500 g | Freq: Four times a day (QID) | INTRAVENOUS | Status: DC
  Administered 2017-07-18 – 2017-07-25 (×27): 2.25 g via INTRAVENOUS
  Filled 2017-07-18 (×29): qty 50

## 2017-07-18 MED ORDER — LIDOCAINE HCL 1 % IJ SOLN
INTRAMUSCULAR | Status: AC
  Filled 2017-07-18: qty 20

## 2017-07-18 MED ORDER — ORAL CARE MOUTH RINSE
15.0000 mL | Freq: Two times a day (BID) | OROMUCOSAL | Status: DC
  Administered 2017-07-19 – 2017-07-24 (×10): 15 mL via OROMUCOSAL

## 2017-07-18 NOTE — Sedation Documentation (Signed)
Time out completed at 1045

## 2017-07-18 NOTE — Consult Note (Signed)
Reason for Consult: Worsening renal function. Referring Physician: Dr. Marland Mcalpine   HPI. Jonathan Walker is an 71 y.o. male. With PMHx significant for HTN, CAD s/p CABG, alcohol abuse, gout and recent right lower extremity wound treated with some antibiotic given by his dermatologist admitted to the hospital because of worsening generalized weakness. His right lower extremity biopsy shows no evidence of vasculitis but specimen was superficial, had weakly positive ANA and C-ANCA titers, they were negative on repeat done today. He was treated as polyarteritis nodosum with Keflex and prednisone.  Patient was not responding except to painful stimuli and seen this morning.According to wife he was complaining of worsening generalized malaise for a few weeks, developed right lower extremity wound,and a course of prednisone and antibiotic was given to him by his dermatologist, wife does not remember the name of antibiotic. According to wife patient is never a good eater, as he likes to drink alcohol all the time. She denies any nausea or vomiting. She did endorse some loss of weight over a period of few month, nothing very pronounced.He does has an history of lower extremity edema for which he was taking Lasix 10 mg every other day, it was increased to 20 mg approximately a month ago.  During current hospitalization he was found to have worsening renal function,hyperkalemic with most recent creatinine of 4.29, last creatinine recording in the beginning of October was 1.38. CT abdomen shows perihepatic fluid collection with air-fluid level, it was drained as more than a liter of purulent discharge, drainage tube was placed by IR, which shows greenish, copious discharge with fecal smell.  Nephrology was consulted because of his worsening renal function,hyperkalemia and altered mental status.  Trend in Creatinine: Creatinine, Ser  Date/Time Value Ref Range Status  07/18/2017 01:30 PM 4.29 (H) 0.61 - 1.24 mg/dL Final   40/98/1191 47:82 AM 3.97 (H) 0.61 - 1.24 mg/dL Final  95/62/1308 65:78 AM 3.83 (H) 0.61 - 1.24 mg/dL Final  46/96/2952 84:13 PM 3.90 (H) 0.61 - 1.24 mg/dL Final    PMH:   Past Medical History:  Diagnosis Date  . Coronary artery disease   . Gout   . Hyperlipidemia   . Hypertension   . Neuropathy     PSH:   Past Surgical History:  Procedure Laterality Date  . ABDOMINAL SURGERY    . AORTIC VALVE REPAIR    . CARDIAC CATHETERIZATION    . CORONARY ARTERY BYPASS GRAFT      Allergies: No Known Allergies  Medications:   Prior to Admission medications   Medication Sig Start Date End Date Taking? Authorizing Provider  allopurinol (ZYLOPRIM) 300 MG tablet Take 300 mg by mouth daily.   Yes [provider]  aspirin EC 81 MG tablet Take 81 mg by mouth daily.   Yes [provider]  cephALEXin (KEFLEX) 500 MG capsule Take 500 mg by mouth 3 (three) times daily.   Yes [provider]  Cholecalciferol (VITAMIN D3) 2000 units TABS Take 1 tablet by mouth 2 (two) times daily.   Yes [provider]  Ferrous Sulfate (IRON) 325 (65 Fe) MG TABS Take 1 tablet by mouth 2 (two) times daily.    Yes [provider]  furosemide (LASIX) 20 MG tablet Take 20 mg by mouth daily.    Yes [provider]  metoprolol succinate (TOPROL-XL) 25 MG 24 hr tablet Take 12.5 mg by mouth every other day.    Yes [provider]  omeprazole (PRILOSEC) 20 MG capsule  Take 20 mg by mouth daily.   Yes [provider]  potassium chloride SA (K-DUR,KLOR-CON) 20 MEQ tablet Take 20 mEq by mouth daily.   Yes [provider]  PREDNISONE PO Take 1 tablet by mouth daily.   Yes [provider]    Inpatient medications: . allopurinol  300 mg Oral QODAY  . aspirin EC  81 mg Oral Daily  . cholecalciferol  2,000 Units Oral BID  . collagenase   Topical Daily  . ferrous sulfate  325 mg Oral BID  . heparin  5,000 Units Subcutaneous Q8H  . lidocaine       . metoprolol succinate  12.5 mg Oral Daily  . multivitamin with minerals  1 tablet Oral Daily  . pantoprazole  40 mg Oral Daily  . sodium chloride flush  5 mL Intravenous Q8H  . thiamine  100 mg Oral Daily   Or  . thiamine  100 mg Intravenous Daily    Discontinued Meds:   Medications Discontinued During This Encounter  Medication Reason  . HYDROcodone-acetaminophen (NORCO) 10-325 MG per tablet Completed Course  . indomethacin (INDOCIN) 50 MG capsule Completed Course  . aspirin 325 MG buffered tablet Change in therapy  . Cholecalciferol (VITAMIN D) 2000 units CAPS Change in therapy  . docusate sodium (COLACE) 100 MG capsule Patient Preference  . famotidine (PEPCID) 20 MG tablet Change in therapy  . simvastatin (ZOCOR) 20 MG tablet Change in therapy  . magnesium oxide (MAG-OX) 400 MG tablet Patient Preference  . omega-3 acid ethyl esters (LOVAZA) 1 g capsule Change in therapy  . acetaminophen (TYLENOL) 500 MG tablet Change in therapy  . allopurinol (ZYLOPRIM) tablet 300 mg   . metoprolol succinate (TOPROL-XL) 24 hr tablet 12.5 mg   . 0.9 %  sodium chloride infusion   . furosemide (LASIX) injection 40 mg   . heparin injection 5,000 Units   . heparin injection 5,000 Units   . folic acid (FOLVITE) tablet 1 mg   . fentaNYL (SUBLIMAZE) 100 MCG/2ML injection Returned to [facility]  . patiromer Lelon Perla(VELTASSA) packet 8.4 g   . furosemide (LASIX) 100 mg in dextrose 5 % 50 mL IVPB     Social History:  reports that he has never smoked. His smokeless tobacco use includes Chew. He reports that he drinks alcohol. His drug history is not on file.  Family History:   Family History  Problem Relation Age of Onset  . Heart disease Brother     Pertinent items are noted in HPI. Weight change:   Intake/Output Summary (Last 24 hours) at 07/18/17 1605 Last data filed at 07/18/17 1321  Gross per 24 hour  Intake          1276.67 ml  Output             1975 ml  Net          -698.33 ml   BP (!)  151/79 (BP Location: Right Arm)   Pulse (!) 107   Temp 99.1 F (37.3 C) (Oral)   Resp 20   Ht 5\' 10"  (1.778 m)   Wt 156 lb 4.9 oz (70.9 kg)   SpO2 98%   BMI 22.43 kg/m  Vitals:   07/18/17 1100 07/18/17 1115 07/18/17 1142 07/18/17 1416  BP: (!) 156/89 (!) 155/79 (!) 125/56 (!) 151/79  Pulse: (!) 110 (!) 112 (!) 101 (!) 107  Resp: (!) 21 20 20 20   Temp:    99.1 F (37.3 C)  TempSrc:  Oral  SpO2: 99% 99% 98% 98%  Weight:      Height:           General: Vital signs reviewed.  Patient is well developed,Not responding except to painful stimuli,not following any commands.  Head: Normocephalic and atraumatic.Fundi benign Eyes: + light reflex, conjunctivae normal, no scleral icterus.  Cardiovascular: RRR, S1 normal, S2 normal, no murmurs, gallops, or rubs. Gr 2/6 M Pulmonary/Chest: Clear bilaterally on anterior auscultation. Deceeased BS Abdominal: mildly distended, right sided drain in place with greenish copious discharge with fecal smell.  Tender diffusely, R>L Extremities: 2+ lower extremity edema, bandage on right lower extremity.  Labs: Basic Metabolic Panel:  Recent Labs Lab 07/15/17 1833 07/16/17 0423 07/17/17 0425 07/18/17 1330  NA 133* 135 137 141  K 5.6* 5.1 5.5* 4.8  CL 105 108 112* 109  CO2 15* 14* 12* 14*  GLUCOSE 115* 75 82 84  BUN 93* 91* 91* 101*  CREATININE 3.90* 3.83* 3.97* 4.29*  ALBUMIN 2.2* 2.0* 1.9* 2.0*  CALCIUM 8.9 8.4* 8.5* 8.8*  PHOS  --   --  7.5* 6.4*   Liver Function Tests:  Recent Labs Lab 07/15/17 1833 07/16/17 0423 07/17/17 0425 07/18/17 1330  AST 22 19  --  25  ALT 17 15*  --  16*  ALKPHOS 90 82  --  80  BILITOT 0.5 0.4  --  1.5*  PROT 5.8* 5.3*  --  5.8*  ALBUMIN 2.2* 2.0* 1.9* 2.0*    Recent Labs Lab 07/15/17 1833  LIPASE 41   No results for input(s): AMMONIA in the last 168 hours. CBC:  Recent Labs Lab 07/15/17 1833 07/16/17 0423 07/17/17 0425 07/17/17 1041 07/18/17 1330  WBC 26.2* 22.4* 18.7*  --   13.1*  NEUTROABS 23.7*  --   --   --  10.5*  HGB 8.4* 7.9* 7.2*  --  8.8*  HCT 24.4* 22.7* 21.2* 21.2* 26.0*  MCV 91.4 91.9 92.2  --  89.3  PLT 415* 387 363  --  368   PT/INR: @LABRCNTIP (inr:5) Cardiac Enzymes: ) Recent Labs Lab 07/15/17 1833  TROPONINI <0.03   CBG: No results for input(s): GLUCAP in the last 168 hours.  Iron Studies:  Recent Labs Lab 07/17/17 0425  IRON 28*  TIBC NOT CALCULATED  FERRITIN 934*    Xrays/Other Studies: Ct Abdomen Pelvis Wo Contrast  Result Date: 07/16/2017 CLINICAL DATA:  Hematuria of unknown cause EXAM: CT ABDOMEN AND PELVIS WITHOUT CONTRAST TECHNIQUE: Multidetector CT imaging of the abdomen and pelvis was performed following the standard protocol without IV contrast. COMPARISON:  07/16/2017 FINDINGS: Lower chest: Lung bases demonstrate mild dependent atelectasis. Small right-sided pleural effusion. Coronary calcifications. Heart size upper limits of normal. Hepatobiliary: Nodular liver contour suspect for cirrhosis. Calcified gallstone. No biliary dilatation. Pancreas: Unremarkable. No pancreatic ductal dilatation or surrounding inflammatory changes. Spleen: Normal in size without focal abnormality. Probable accessory splenule posterior to the spleen. Adrenals/Urinary Tract: Adrenal glands are within normal limits. No hydronephrosis. Urinary bladder slightly thick walled but underdistended Stomach/Bowel: Stomach nonenlarged. No dilated small bowel. No colon wall thickening. Normal appendix. Sigmoid colon diverticular disease without acute inflammation. Vascular/Lymphatic: Aortic atherosclerosis. No aneurysmal dilatation. No significantly enlarged abdominal or pelvic lymph nodes. Reproductive: Prostate is unremarkable. Other: Small amount of ascites in the pelvis. Large Terry hepatic gas and fluid collection measuring 16 cm AP by 3.6 cm thick by 21.6 cm cranial caudad. It is probably contiguous with an additional gas and fluid collection in the right  lower  quadrant measuring 11 cm. No significant rim enhancement. Some mass effect on the adjacent liver. No free air is seen. Musculoskeletal: Scoliosis and degenerative changes of the spine. Post sternotomy changes. No acute or suspicious bone lesion. IMPRESSION: 1. Large perihepatic gas and fluid collection that probably communicates with an additional large gas and fluid collection in the right lower quadrant of the abdomen. Differential considerations include loculated ascites with superimposed gas-forming infection, possible contained perforation although no diseased segments of large or small bowel are seen, and given the somewhat subcapsular appearance superiorly, old hematoma, possibly with infection. 2. Nodular contour of the liver suggests cirrhosis 3. Small moderate pelvic ascites 4. Sigmoid colon diverticular disease without acute inflammation 5. Gallstone Electronically Signed   By: Jasmine Pang M.D.   On: 07/16/2017 20:02   Ct Head Wo Contrast  Result Date: 07/18/2017 CLINICAL DATA:  Altered level of consciousness, unexplained. EXAM: CT HEAD WITHOUT CONTRAST TECHNIQUE: Contiguous axial images were obtained from the base of the skull through the vertex without intravenous contrast. COMPARISON:  09/08/2005 FINDINGS: Brain: No acute finding by CT. Generalized brain atrophy. Chronic small-vessel ischemic changes throughout the hemispheric white matter, basal ganglia and thalami I. No mass lesion, hemorrhage, hydrocephalus or extra-axial collection. Vascular: There is atherosclerotic calcification of the major vessels at the base of the brain. Skull: Negative Sinuses/Orbits: Clear/normal Other: None IMPRESSION: No acute finding by CT. Atrophy and chronic small-vessel ischemic changes. Electronically Signed   By: Paulina Fusi M.D.   On: 07/18/2017 15:55   Dg Chest Port 1 View  Result Date: 07/18/2017 CLINICAL DATA:  Status post placement of a perihepatic drain for abscess today. EXAM: PORTABLE  CHEST 1 VIEW COMPARISON:  CT abdomen and pelvis 07/16/2017 and images from drain placement today. PA and lateral chest 07/15/2017. FINDINGS: Drainage catheter is identified in the right upper quadrant of the abdomen. There is mild elevation the right hemidiaphragm relative to the left, unchanged. Lungs are clear. No pneumothorax or pleural effusion. Patient status post median sternotomy. Heart size is upper normal. IMPRESSION: No acute cardiopulmonary disease. Drainage catheter in the right upper quadrant of the abdomen. Electronically Signed   By: Drusilla Kanner M.D.   On: 07/18/2017 14:10   Ct Image Guided Drainage By Percutaneous Catheter  Result Date: 07/18/2017 INDICATION: Large right intra-abdominal abscess EXAM: CT DRAINAGE RIGHT INTRA-ABDOMINAL ABSCESS MEDICATIONS: The patient is currently admitted to the hospital and receiving intravenous antibiotics. The antibiotics were administered within an appropriate time frame prior to the initiation of the procedure. ANESTHESIA/SEDATION: Moderate Sedation Time:  None. The patient was continuously monitored during the procedure by the interventional radiology nurse under my direct supervision. COMPLICATIONS: None immediate. PROCEDURE: Informed written consent was obtained from the patient and his family after a thorough discussion of the procedural risks, benefits and alternatives. All questions were addressed. Maximal Sterile Barrier Technique was utilized including caps, mask, sterile gowns, sterile gloves, sterile drape, hand hygiene and skin antiseptic. A timeout was performed prior to the initiation of the procedure. Previous imaging reviewed. Patient positioned supine. Noncontrast localization CT performed to localize the right abdominal large abscess. Overlying skin marked in the right mid abdomen. Under sterile conditions and local anesthesia, an 18 gauge 10 cm access needle was advanced percutaneously into the large fluid collection. Needle position  confirmed with CT. Amplatz guidewire inserted followed by tract dilatation to insert a 16 French abscess drain. Drain catheter position confirmed with CT. Syringe aspiration yielded 1100 cc purulent fluid compatible with abscess. Sample sent  for Gram stain and culture. Catheter secured with a Prolene suture and connected to external gravity drainage bag. Patient tolerated the procedure well. No immediate complication. IMPRESSION: Successful CT-guided right abdominal abscess drain catheter insertion. Electronically Signed   By: Judie Petit.  Shick M.D.   On: 07/18/2017 12:52     Assessment/Plan: 1.  AKI. Renal function continued to get worsen, most likely multifactorial due to decreased perfusion in the presence of abdominal infection/abscess, and hepatic failure leading . Vasculitis can be a possibility along with ATN.UA was positive for hematuria and red cell cast.  - repeat ANA , c-ANCA and myeloperoxidase levels are within normal limit,total complement was within normal limits with mildly decreased C3. -continue isotonic fluid with bicarbonate. -Strict intake and output-Foley was inserted. -Hold Lasix and Valtessa. Potassium should improve with bicarbonate. 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 Medium vessel vasculits, non ANCA, ie Polyarteritis), vs post or peri infectious GN.  Most likely multifactorial.  Consider local involvement with abscess. Has complications of mild volume xs, acidemia.  NO indication to lower vol.  2. Abdominal abscess. It looks like it is due to a bowel perforation. likely the cause of his altered mental status. He needs a repeat CT abdomen with oral contrast. -He will need a surgical consult. -already started on Zosyn. -Fluid chemistries and culture is pending. Cause of abscess ??  Divertic, ?vasculitic with ischemia vs other  3. Altered mental status. Can multifactorial due to abdominal abscess/ hepatic failure. Bilirubin started rising. Less likely due to  worsening renal function. -he should be moved to step down unit for more monitoring. -hemolysis labs, PT, APTT, INR were ordered.  4. Anemia. Hemoglobin stable around 8.8, t was 7.2 yesterday ? Labs might be drawn on the site of IV ? He has microscopic hematuria and FOBT positive. -continue monitoring. -transfuse as needed  5. Hypertension. Currently blood pressure mildly elevated, initially softer blood pressure. amlodipine can be added if needed. -keep monitoring. 6 CAD 7 ETOH abuse  I have seen and examined this patient and agree with the plan of care seen, eval, examined, counseled family, discussed with primary, resident.  Changes made  Darcel Zick L 07/18/2017, 8:32 PM    Sumayya Amin 07/18/2017, 4:05 PM

## 2017-07-18 NOTE — Sedation Documentation (Signed)
Patient is resting comfortably. 

## 2017-07-18 NOTE — Progress Notes (Signed)
[facility], will transfer once back from ct scan.
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
## 2017-07-18 NOTE — Progress Notes (Signed)
Pt received from CT [facility] from [facility]. Pt now on 3M01.

## 2017-07-18 NOTE — Progress Notes (Signed)
Patient transferred to Radiology, family at bedside.

## 2017-07-18 NOTE — Progress Notes (Signed)
Called to room by family, they were extremely concerned with patients level of confusion. Wife and daughters stated it had been 2 days since patient had a drink and they were concerned with withdraw. When ask how much and how often patient drank wife stated daily and multiple family members stated "ALOT" Family also stated that patient was even more confused and combative then when he left [name]. They were very worried about patient not being treated for symptoms of withdrawal.  On call paged CIWA protocol started 1mg  of IV Ativan given. Patient has rested comfortably throughout the night. Will continue to monitor.    Cindee SaltMcBride,Brysan Mcevoy K, RN

## 2017-07-18 NOTE — Progress Notes (Signed)
Made MD Marland McalpineSheikh aware that since patient was lethargic that we were unable to give his oral scheduled meds for this morning.

## 2017-07-18 NOTE — NC FL2 (Signed)
[place] MEDICAID FL2 LEVEL OF CARE SCREENING TOOL     IDENTIFICATION  Patient Name: Jonathan Walker Birthdate: 03/19/1946 Sex: male Admission Date (Current Location): 07/15/2017  [facility] [place]   Facility and Address:  [facility], [address], [place], [place] [address]      Provider Number: [id number]  Attending Physician Name and Address:  Merlene LaughterSheikh, Omair Latif, DO  Relative Name and Phone Number:       Current Level of Care: Hospital Recommended Level of Care: Skilled Nursing Facility Prior Approval Number:    Date Approved/Denied:   PASRR Number: [id number] [id number]  Discharge Plan: SNF    Current Diagnoses: Patient Active Problem List   Diagnosis Date Noted  . 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 Normocytic anemia 07/16/2017  . AKI (acute kidney injury) (HCC) 07/15/2017  . Gouty arthritis 01/23/2014  . Acute gouty arthritis 01/16/2014  . Pain in lower limb 11/14/2013  . Other hammer toe (acquired) 08/01/2013  . Onychomycosis due to dermatophyte 01/28/2013  . Pain in joint, ankle and foot 01/28/2013    Orientation RESPIRATION BLADDER Height & Weight     Self  O2 ([place] 2L) Continent Weight: 156 lb 4.9 oz (70.9 kg) Height:  5\' 10"  (177.8 cm)  BEHAVIORAL SYMPTOMS/MOOD NEUROLOGICAL BOWEL NUTRITION STATUS      Continent Diet (low sodium)  AMBULATORY STATUS COMMUNICATION OF NEEDS Skin   Extensive Assist Verbally Skin abrasions (lower right leg wound, foam dressing changed daily; left second toe wound, foam dressing)                       Personal Care Assistance Level of Assistance  Bathing, Dressing Bathing Assistance: Maximum assistance   Dressing Assistance: Maximum assistance     Functional Limitations Info             SPECIAL CARE FACTORS FREQUENCY  PT (By licensed PT), OT (By licensed OT)     PT Frequency: 5x/wk OT Frequency: 5x/wk            Contractures      Additional Factors Info   Code Status, Allergies Code Status Info: DNR Allergies Info: NKA           Current Medications (07/18/2017):  This is the current hospital active medication list Current Facility-Administered Medications  Medication Dose Route Frequency Provider Last Rate Last Dose  . acetaminophen (TYLENOL) tablet 650 mg  650 mg Oral Q6H PRN Meredeth IdeLama, Gagan S, MD       Or  . acetaminophen (TYLENOL) suppository 650 mg  650 mg Rectal Q6H PRN Meredeth IdeLama, Gagan S, MD      . allopurinol (ZYLOPRIM) tablet 300 mg  300 mg Oral Idelia SalmQODAY Short, Mackenzie, MD      . aspirin EC tablet 81 mg  81 mg Oral Daily Meredeth IdeLama, Gagan S, MD   81 mg at 07/17/17 0911  . cholecalciferol (VITAMIN D) tablet 2,000 Units  2,000 Units Oral BID Meredeth IdeLama, Gagan S, MD   2,000 Units at 07/17/17 2122  . collagenase (SANTYL) ointment   Topical Daily Short, Mackenzie, MD      . ferrous sulfate tablet 325 mg  325 mg Oral BID Meredeth IdeLama, Gagan S, MD   325 mg at 07/17/17 2122  . furosemide (LASIX) 100 mg in dextrose 5 % 50 mL IVPB  100 mg Intravenous BID Salomon MastBefekadu, Belayenh, MD 60 mL/hr at 07/17/17 1751 100 mg at 07/17/17 1751  . heparin injection  5,000 Units  5,000 Units Subcutaneous Q8H Barnetta Chapel, PA-C      . lidocaine (XYLOCAINE) 1 % (with pres) injection           . LORazepam (ATIVAN) tablet 1 mg  1 mg Oral Q6H PRN Bodenheimer, Bing Neighbors, NP       Or  . LORazepam (ATIVAN) injection 1 mg  1 mg Intravenous Q6H PRN Bodenheimer, Charles A, NP   1 mg at 07/17/17 2011  . metoprolol succinate (TOPROL-XL) 24 hr tablet 12.5 mg  12.5 mg Oral Daily Short, Mackenzie, MD   12.5 mg at 07/17/17 0911  . multivitamin with minerals tablet 1 tablet  1 tablet Oral Daily Macon Large, NP   1 tablet at 07/17/17 2122  . ondansetron (ZOFRAN) tablet 4 mg  4 mg Oral Q6H PRN Meredeth Ide, MD       Or  . ondansetron (ZOFRAN) injection 4 mg  4 mg Intravenous Q6H PRN Meredeth Ide, MD      . pantoprazole (PROTONIX) EC tablet 40 mg  40 mg Oral Daily Meredeth Ide, MD   40 mg at  07/17/17 0911  . patiromer Lelon Perla) packet 8.4 g  8.4 g Oral Daily Salomon Mast, MD   8.4 g at 07/17/17 1032  . sodium bicarbonate 150 mEq in sterile water 1,000 mL infusion   Intravenous Continuous Renae Fickle, MD 100 mL/hr at 07/18/17 [phone]    . thiamine (VITAMIN B-1) tablet 100 mg  100 mg Oral Daily Bodenheimer, Charles A, NP       Or  . thiamine (B-1) injection 100 mg  100 mg Intravenous Daily Bodenheimer, Charles A, NP   100 mg at 07/17/17 2122     Discharge Medications: Please see discharge summary for a list of discharge medications.  Relevant Imaging Results:  Relevant Lab Results:   Additional Information SS#: [id number]  Baldemar Lenis, LCSW

## 2017-07-18 NOTE — Consult Note (Signed)
Reason for Consult: Worsening renal function. Referring Physician: Dr. Marland Mcalpine   HPI. Jonathan Walker is an 71 y.o. male. With PMHx significant for HTN, CAD s/p CABG, alcohol abuse, gout and recent right lower extremity wound treated with some antibiotic given by his dermatologist admitted to the hospital because of worsening generalized weakness. His right lower extremity biopsy shows no evidence of vasculitis but specimen was superficial, had weakly positive ANA and C-ANCA titers, they were negative on repeat done today. He was treated as polyarteritis nodosum with Keflex and prednisone.  Patient was not responding except to painful stimuli and seen this morning.According to wife he was complaining of worsening generalized malaise for a few weeks, developed right lower extremity wound,and a course of prednisone and antibiotic was given to him by his dermatologist, wife does not remember the name of antibiotic. According to wife patient is never a good eater, as he likes to drink alcohol all the time. She denies any nausea or vomiting. She did endorse some loss of weight over a period of few month, nothing very pronounced.He does has an history of lower extremity edema for which he was taking Lasix 10 mg every other day, it was increased to 20 mg approximately a month ago.  During current hospitalization he was found to have worsening renal function,hyperkalemic with most recent creatinine of 4.29, last creatinine recording in the beginning of October was 1.38. CT abdomen shows perihepatic fluid collection with air-fluid level, it was drained as more than a liter of purulent discharge, drainage tube was placed by IR, which shows greenish, copious discharge with fecal smell.  Nephrology was consulted because of his worsening renal function,hyperkalemia and altered mental status.  Trend in Creatinine: Creatinine, Ser  Date/Time Value Ref Range Status  07/18/2017 01:30 PM 4.29 (H) 0.61 - 1.24 mg/dL Final   16/06/9603 54:09 AM 3.97 (H) 0.61 - 1.24 mg/dL Final  81/19/1478 29:56 AM 3.83 (H) 0.61 - 1.24 mg/dL Final  21/30/8657 84:69 PM 3.90 (H) 0.61 - 1.24 mg/dL Final    PMH:   Past Medical History:  Diagnosis Date  . Coronary artery disease   . Gout   . Hyperlipidemia   . Hypertension   . Neuropathy     PSH:   Past Surgical History:  Procedure Laterality Date  . ABDOMINAL SURGERY    . AORTIC VALVE REPAIR    . CARDIAC CATHETERIZATION    . CORONARY ARTERY BYPASS GRAFT      Allergies: No Known Allergies  Medications:   Prior to Admission medications   Medication Sig Start Date End Date Taking? Authorizing Provider  allopurinol (ZYLOPRIM) 300 MG tablet Take 300 mg by mouth daily.   Yes [provider]  aspirin EC 81 MG tablet Take 81 mg by mouth daily.   Yes [provider]  cephALEXin (KEFLEX) 500 MG capsule Take 500 mg by mouth 3 (three) times daily.   Yes [provider]  Cholecalciferol (VITAMIN D3) 2000 units TABS Take 1 tablet by mouth 2 (two) times daily.   Yes [provider]  Ferrous Sulfate (IRON) 325 (65 Fe) MG TABS Take 1 tablet by mouth 2 (two) times daily.    Yes [provider]  furosemide (LASIX) 20 MG tablet Take 20 mg by mouth daily.    Yes [provider]  metoprolol succinate (TOPROL-XL) 25 MG 24 hr tablet Take 12.5 mg by mouth every other day.    Yes [provider]  omeprazole (PRILOSEC) 20 MG capsule  Take 20 mg by mouth daily.   Yes [provider]  potassium chloride SA (K-DUR,KLOR-CON) 20 MEQ tablet Take 20 mEq by mouth daily.   Yes [provider]  PREDNISONE PO Take 1 tablet by mouth daily.   Yes [provider]    Inpatient medications: . allopurinol  300 mg Oral QODAY  . aspirin EC  81 mg Oral Daily  . cholecalciferol  2,000 Units Oral BID  . collagenase   Topical Daily  . ferrous sulfate  325 mg Oral BID  . heparin  5,000 Units Subcutaneous Q8H  . lidocaine       . metoprolol succinate  12.5 mg Oral Daily  . multivitamin with minerals  1 tablet Oral Daily  . pantoprazole  40 mg Oral Daily  . sodium chloride flush  5 mL Intravenous Q8H  . thiamine  100 mg Oral Daily   Or  . thiamine  100 mg Intravenous Daily    Discontinued Meds:   Medications Discontinued During This Encounter  Medication Reason  . HYDROcodone-acetaminophen (NORCO) 10-325 MG per tablet Completed Course  . indomethacin (INDOCIN) 50 MG capsule Completed Course  . aspirin 325 MG buffered tablet Change in therapy  . Cholecalciferol (VITAMIN D) 2000 units CAPS Change in therapy  . docusate sodium (COLACE) 100 MG capsule Patient Preference  . famotidine (PEPCID) 20 MG tablet Change in therapy  . simvastatin (ZOCOR) 20 MG tablet Change in therapy  . magnesium oxide (MAG-OX) 400 MG tablet Patient Preference  . omega-3 acid ethyl esters (LOVAZA) 1 g capsule Change in therapy  . acetaminophen (TYLENOL) 500 MG tablet Change in therapy  . allopurinol (ZYLOPRIM) tablet 300 mg   . metoprolol succinate (TOPROL-XL) 24 hr tablet 12.5 mg   . 0.9 %  sodium chloride infusion   . furosemide (LASIX) injection 40 mg   . heparin injection 5,000 Units   . heparin injection 5,000 Units   . folic acid (FOLVITE) tablet 1 mg   . fentaNYL (SUBLIMAZE) 100 MCG/2ML injection Returned to [facility]  . patiromer Lelon Perla(VELTASSA) packet 8.4 g   . furosemide (LASIX) 100 mg in dextrose 5 % 50 mL IVPB     Social History:  reports that he has never smoked. His smokeless tobacco use includes Chew. He reports that he drinks alcohol. His drug history is not on file.  Family History:   Family History  Problem Relation Age of Onset  . Heart disease Brother     Pertinent items are noted in HPI. Weight change:   Intake/Output Summary (Last 24 hours) at 07/18/17 1605 Last data filed at 07/18/17 1321  Gross per 24 hour  Intake          1276.67 ml  Output             1975 ml  Net          -698.33 ml   BP (!)  151/79 (BP Location: Right Arm)   Pulse (!) 107   Temp 99.1 F (37.3 C) (Oral)   Resp 20   Ht 5\' 10"  (1.778 m)   Wt 156 lb 4.9 oz (70.9 kg)   SpO2 98%   BMI 22.43 kg/m  Vitals:   07/18/17 1100 07/18/17 1115 07/18/17 1142 07/18/17 1416  BP: (!) 156/89 (!) 155/79 (!) 125/56 (!) 151/79  Pulse: (!) 110 (!) 112 (!) 101 (!) 107  Resp: (!) 21 20 20 20   Temp:    99.1 F (37.3 C)  TempSrc:  Oral  SpO2: 99% 99% 98% 98%  Weight:      Height:           General: Vital signs reviewed.  Patient is well developed,Not responding except to painful stimuli,not following any commands.  Head: Normocephalic and atraumatic. Eyes: + light reflex, conjunctivae normal, no scleral icterus.  Cardiovascular: RRR, S1 normal, S2 normal, no murmurs, gallops, or rubs. Pulmonary/Chest: Clear bilaterally on anterior auscultation. Abdominal: mildly distended, right sided drain in place with greenish copious discharge with fecal smell.  Extremities: 2+ lower extremity edema, bandage on right lower extremity.  Labs: Basic Metabolic Panel:  Recent Labs Lab 07/15/17 1833 07/16/17 0423 07/17/17 0425 07/18/17 1330  NA 133* 135 137 141  K 5.6* 5.1 5.5* 4.8  CL 105 108 112* 109  CO2 15* 14* 12* 14*  GLUCOSE 115* 75 82 84  BUN 93* 91* 91* 101*  CREATININE 3.90* 3.83* 3.97* 4.29*  ALBUMIN 2.2* 2.0* 1.9* 2.0*  CALCIUM 8.9 8.4* 8.5* 8.8*  PHOS  --   --  7.5* 6.4*   Liver Function Tests:  Recent Labs Lab 07/15/17 1833 07/16/17 0423 07/17/17 0425 07/18/17 1330  AST 22 19  --  25  ALT 17 15*  --  16*  ALKPHOS 90 82  --  80  BILITOT 0.5 0.4  --  1.5*  PROT 5.8* 5.3*  --  5.8*  ALBUMIN 2.2* 2.0* 1.9* 2.0*    Recent Labs Lab 07/15/17 1833  LIPASE 41   No results for input(s): AMMONIA in the last 168 hours. CBC:  Recent Labs Lab 07/15/17 1833 07/16/17 0423 07/17/17 0425 07/17/17 1041 07/18/17 1330  WBC 26.2* 22.4* 18.7*  --  13.1*  NEUTROABS 23.7*  --   --   --  10.5*  HGB 8.4* 7.9*  7.2*  --  8.8*  HCT 24.4* 22.7* 21.2* 21.2* 26.0*  MCV 91.4 91.9 92.2  --  89.3  PLT 415* 387 363  --  368   PT/INR: @LABRCNTIP (inr:5) Cardiac Enzymes: ) Recent Labs Lab 07/15/17 1833  TROPONINI <0.03   CBG: No results for input(s): GLUCAP in the last 168 hours.  Iron Studies:  Recent Labs Lab 07/17/17 0425  IRON 28*  TIBC NOT CALCULATED  FERRITIN 934*    Xrays/Other Studies: Ct Abdomen Pelvis Wo Contrast  Result Date: 07/16/2017 CLINICAL DATA:  Hematuria of unknown cause EXAM: CT ABDOMEN AND PELVIS WITHOUT CONTRAST TECHNIQUE: Multidetector CT imaging of the abdomen and pelvis was performed following the standard protocol without IV contrast. COMPARISON:  07/16/2017 FINDINGS: Lower chest: Lung bases demonstrate mild dependent atelectasis. Small right-sided pleural effusion. Coronary calcifications. Heart size upper limits of normal. Hepatobiliary: Nodular liver contour suspect for cirrhosis. Calcified gallstone. No biliary dilatation. Pancreas: Unremarkable. No pancreatic ductal dilatation or surrounding inflammatory changes. Spleen: Normal in size without focal abnormality. Probable accessory splenule posterior to the spleen. Adrenals/Urinary Tract: Adrenal glands are within normal limits. No hydronephrosis. Urinary bladder slightly thick walled but underdistended Stomach/Bowel: Stomach nonenlarged. No dilated small bowel. No colon wall thickening. Normal appendix. Sigmoid colon diverticular disease without acute inflammation. Vascular/Lymphatic: Aortic atherosclerosis. No aneurysmal dilatation. No significantly enlarged abdominal or pelvic lymph nodes. Reproductive: Prostate is unremarkable. Other: Small amount of ascites in the pelvis. Large Terry hepatic gas and fluid collection measuring 16 cm AP by 3.6 cm thick by 21.6 cm cranial caudad. It is probably contiguous with an additional gas and fluid collection in the right lower quadrant measuring 11 cm. No significant rim  enhancement. Some  mass effect on the adjacent liver. No free air is seen. Musculoskeletal: Scoliosis and degenerative changes of the spine. Post sternotomy changes. No acute or suspicious bone lesion. IMPRESSION: 1. Large perihepatic gas and fluid collection that probably communicates with an additional large gas and fluid collection in the right lower quadrant of the abdomen. Differential considerations include loculated ascites with superimposed gas-forming infection, possible contained perforation although no diseased segments of large or small bowel are seen, and given the somewhat subcapsular appearance superiorly, old hematoma, possibly with infection. 2. Nodular contour of the liver suggests cirrhosis 3. Small moderate pelvic ascites 4. Sigmoid colon diverticular disease without acute inflammation 5. Gallstone Electronically Signed   By: Jasmine Pang M.D.   On: 07/16/2017 20:02   Ct Head Wo Contrast  Result Date: 07/18/2017 CLINICAL DATA:  Altered level of consciousness, unexplained. EXAM: CT HEAD WITHOUT CONTRAST TECHNIQUE: Contiguous axial images were obtained from the base of the skull through the vertex without intravenous contrast. COMPARISON:  09/08/2005 FINDINGS: Brain: No acute finding by CT. Generalized brain atrophy. Chronic small-vessel ischemic changes throughout the hemispheric white matter, basal ganglia and thalami I. No mass lesion, hemorrhage, hydrocephalus or extra-axial collection. Vascular: There is atherosclerotic calcification of the major vessels at the base of the brain. Skull: Negative Sinuses/Orbits: Clear/normal Other: None IMPRESSION: No acute finding by CT. Atrophy and chronic small-vessel ischemic changes. Electronically Signed   By: Paulina Fusi M.D.   On: 07/18/2017 15:55   Dg Chest Port 1 View  Result Date: 07/18/2017 CLINICAL DATA:  Status post placement of a perihepatic drain for abscess today. EXAM: PORTABLE CHEST 1 VIEW COMPARISON:  CT abdomen and pelvis  07/16/2017 and images from drain placement today. PA and lateral chest 07/15/2017. FINDINGS: Drainage catheter is identified in the right upper quadrant of the abdomen. There is mild elevation the right hemidiaphragm relative to the left, unchanged. Lungs are clear. No pneumothorax or pleural effusion. Patient status post median sternotomy. Heart size is upper normal. IMPRESSION: No acute cardiopulmonary disease. Drainage catheter in the right upper quadrant of the abdomen. Electronically Signed   By: Drusilla Kanner M.D.   On: 07/18/2017 14:10   Ct Image Guided Drainage By Percutaneous Catheter  Result Date: 07/18/2017 INDICATION: Large right intra-abdominal abscess EXAM: CT DRAINAGE RIGHT INTRA-ABDOMINAL ABSCESS MEDICATIONS: The patient is currently admitted to the hospital and receiving intravenous antibiotics. The antibiotics were administered within an appropriate time frame prior to the initiation of the procedure. ANESTHESIA/SEDATION: Moderate Sedation Time:  None. The patient was continuously monitored during the procedure by the interventional radiology nurse under my direct supervision. COMPLICATIONS: None immediate. PROCEDURE: Informed written consent was obtained from the patient and his family after a thorough discussion of the procedural risks, benefits and alternatives. All questions were addressed. Maximal Sterile Barrier Technique was utilized including caps, mask, sterile gowns, sterile gloves, sterile drape, hand hygiene and skin antiseptic. A timeout was performed prior to the initiation of the procedure. Previous imaging reviewed. Patient positioned supine. Noncontrast localization CT performed to localize the right abdominal large abscess. Overlying skin marked in the right mid abdomen. Under sterile conditions and local anesthesia, an 18 gauge 10 cm access needle was advanced percutaneously into the large fluid collection. Needle position confirmed with CT. Amplatz guidewire inserted  followed by tract dilatation to insert a 16 French abscess drain. Drain catheter position confirmed with CT. Syringe aspiration yielded 1100 cc purulent fluid compatible with abscess. Sample sent for Gram stain and culture. Catheter secured with a  Prolene suture and connected to external gravity drainage bag. Patient tolerated the procedure well. No immediate complication. IMPRESSION: Successful CT-guided right abdominal abscess drain catheter insertion. Electronically Signed   By: Judie Petit.  Shick M.D.   On: 07/18/2017 12:52     Assessment/Plan: 1.  AKI. Renal function continued to get worsen, most likely multifactorial due to decreased perfusion in the presence of abdominal infection/abscess, and hepatic failure leading to hepatorenal syndrome. Vasculitis can be a possibility along with ATN.UA was positive for hematuria and red cell cast.  - repeat ANA , c-ANCA and myeloperoxidase levels are within normal limit,total complement was within normal limits with mildly decreased C3. -continue isotonic fluid with bicarbonate. -Strict intake and output-Foley was inserted. -Hold Lasix and Valtessa. Potassium should improve with bicarbonate.  2. Abdominal abscess. It looks like it is due to a bowel perforation. Most likely the cause of his altered mental status. He needs a repeat CT abdomen with oral contrast. -He will need a surgical consult. -already started on Zosyn. -Fluid chemistries and culture is pending.  3. Altered mental status. Can be multifactorial due to abdominal abscess/ hepatic failure. Bilirubin started rising. Less likely due to worsening renal function. -he should be moved to step down unit for more monitoring. -hemolysis labs, PT, APTT, INR were ordered.  4. Anemia. Hemoglobin stable around 8.8, t was 7.2 yesterday ? Labs might be drawn on the site of IV ? He has microscopic hematuria and FOBT positive. -continue monitoring. -transfuse as needed  5. Hypertension. Currently blood  pressure mildly elevated, initially softer blood pressure. amlodipine can be added if needed. -keep monitoring.   Sumayya Amin 07/18/2017, 4:05 PM .  I have seen and examined this patient and agree with the plan of care see my note  Ciarrah Rae L 07/18/2017, 8:34 PM

## 2017-07-18 NOTE — Progress Notes (Signed)
Pharmacy Antibiotic Note  Jonathan Walker is a 71 y.o. male admitted on 07/15/2017 with progressive weakness over the past few weeks.  Found to have intra-abdominal fluid collected, now s/p drain placement.  Pharmacy has been consulted for Zosyn dosing.  Patient has reduced renal clearance at CrCL 17 ml/min.  He is afebrile and his WBC is improving.   Plan: Zosyn 2.25gm IV Q6H Monitor renal fxn, micro data, clinical progress   Height: 5\' 10"  (177.8 cm) Weight: 156 lb 4.9 oz (70.9 kg) IBW/kg (Calculated) : 73  Temp (24hrs), Avg:98.3 F (36.8 C), Min:98 F (36.7 C), Max:98.8 F (37.1 C)   Recent Labs Lab 07/15/17 1833 07/16/17 0423 07/17/17 0425  WBC 26.2* 22.4* 18.7*  CREATININE 3.90* 3.83* 3.97*    Estimated Creatinine Clearance: 17.1 mL/min (A) (by C-G formula based on SCr of 3.97 mg/dL (H)).    No Known Allergies   Zosyn 10/24 >>  10/24 deep wound cx - 1024 BCx -    Noemi Ishmael D. Laney Potashang, PharmD, BCPS Pager:  [phone] 07/18/2017, 12:43 PM
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
## 2017-07-18 NOTE — Procedures (Signed)
Large abdominal abscess  S/p CT DRAIN RT ABDOMEN  No comp Stable EBL0 1100cc pus aspirated cx sent Full report in pacs

## 2017-07-18 NOTE — Progress Notes (Signed)
PROGRESS NOTE    Jonathan Walker  WUJ:[id number] DOB: 1945/10/20 DOA: 07/15/2017 PCP: Samuel Jester, DO   Brief Narrative:  Patient is a 71 year old CAD s/p CABG, hypertension, hyperlipidemia, gout who resents with weakness and was found to have AKI.  His lasix was increased from every other day to daily dosing about 1 month ago.  For the last few days, however, he has not made very much urine output and he has become very fatigued.  Additionally, he was seen by dermatology, Dr. Margo Aye this month for some new right lower extremity ulcers.  They were biopsied and biopsy demonstrated no evidence of vasculitis, but sample was superficial and nonspecific.  He was diagnosed with polyarteritis nodosum and given keflex and prednisone taper which have helped his ulcers.  He had a weakly positive ANA and CANCA, both titers were 1:80 earlier this month. Creatinine has worsened and patient was transferred to [facility] and had a drain placed for an intra-abdominal abscess. He was encephalopathic this AM and somnolent and would only respond to physical stimuli.   Assessment & Plan:   Active Problems:   AKI (acute kidney injury) (HCC)   Polyarteritis nodosa (HCC)   Normocytic anemia  Acute kidney injury with microscopic Hematuria/Evidence of possible nephritis.   -Baseline creatinine is 0.9 on May 8th, 2018.  On and at that time his lasix dose was increased.   -Creatinine 1.38 on Oct 1st.  Possible this may all be due to ATN from dehydration/overdiuresis, but autoimmune disorder also possible. Cr has worsened in the setting of Abdominal Infection/Abscess and Cr is now 4.29  -Potassium rising and CO2 declining -Nephrology consultation appreciated -ANA has been repeated -Mpo/pr-3 ANCA Ab WNL  -Complement:  C3 70 (Low), CH 50 and C4 wnl -HCV negative and HBV pending -HIV Non-Reactive -UPEP being collected -Change to bicarb containing IVF -Started Lasix for hyperkalemia but held by Nephrology  -Patiromer  stopped Nephrology -Avoid Kayexelate in setting of occult positive stool and unusual abdominal fluid collection -Renal US:  Minimally complex bilateral renal cysts, unchanged from prior -May need renal biopsy -Consulted Nephrology Dr. Darrick Penna for additional evaluation and recommendations   Intra-abdominal Abscess s/p Drain by IR -May be infected ascites given suggestion of cirrhosis versus blood versus contained bowel perforation -CT scan: Large fluid collection around the liver extending into the right lower quadrant with air fluid level -Transferred to [facility] for IR paracentesis and drain placement -IR drained 1.1 Liter of purulent discharge and drainage tube showed cloudy grey purulent discharge  -Ordered paracentesis labs with cytology and a hematocrit (in case it's bloody) -Patient was Pan-Cultured (Blood, Urine, CXR) and started on Empiric Zosyn -Abdominal CT Scan repeat ordered and General Surgery Consulted and appreciate Evaluation   Acute Encephalopathy -Received 1 mg of Ativan last night for CIWA but doubt his Encephalopathy is explained by that -Transferring to SDU for Closer Evaluation  -Ordered Head CT Scan w/o Contrast -C/w Bicarb gtt (150 mEQ) at 100 mL/hr -Check Ammonia Level -Check RPR -If not improving will order MRI and EEG and discuss with Neurology   Coronary Artery Disease -Stable -Continue aspirin and beta blocker -Not on statin  Gout -Stable -Continue allopurinol  Hypertension -Blood pressure low normal -Hold parameter for metoprolol and continue at 12.5 mg po Daily   Leg ulcers,  -possible polyarteritis nodosa, improved with antibiotics and steroids -Good pedal pulse -ABI wnl -Appreciate wound care:  Santyl with dry dressings daily  Leukocytosis likely from Intra-abdominal Abscess -Less Likely from  Steroids] -Started Patient on Emprici Zosyn -Pan-Culture and repeat Abdominal CT Scan  -WBC went from 22.4 -> 18.7 -> 13.1 -Repeat CBC  in AM  Anemia of inflammation and possible renal disease and slow GI losses.  -Hemoglobin continued to decline which may be hemodilutional but also consider blood loss around liver? -Iron studies suggest inflammation, B12 HIGH, folate wnl, SPEP pending -TSH 1.76 -Occult stool POSITIVE -C/w Ferrous Sulfate 325 mg po BID - Nephrology ordering Hemolysis Labs -Hb/Hct went from 7.2/21.2 -> 8.8/26.0 -Continue to Monitor and Repeat CBC in AM   Hx of EtOH Abuse/Concern for Withdrawl -Patient's family states he drinks heavily -Placed on CIWA and given 1 mg of Ativan last night   Hyperammonemia -Patient's Ammonia Level 52 -Repeat Ammonia Level in AM and await Repeat CT Scan findings prior to administration of Lactulose   DVT prophylaxis: Heparin 5,000 sq  q8h Code Status: DO NOT RESUSCITATE  Family Communication: Discussed with Wife and Daughters at beside Disposition Plan: Transfer to SDU as patient is worsening; SNF when medically approved   Consultants:   Nephrology  General Surgery   Procedures:  S/p CT DRAIN RT ABDOMEN   Antimicrobials: Anti-infectives    Start     Dose/Rate Route Frequency Ordered Stop   07/18/17 1300  piperacillin-tazobactam (ZOSYN) IVPB 2.25 g     2.25 g 100 mL/hr over 30 Minutes Intravenous Every 6 hours 07/18/17 1245       Subjective: Seen and examined and was encephalopathic and would respond to physical stimuli but not verbal stimuli. Family states he has been "out of it since 8:00 pm last night." Family concerned about his EtOH withdrawal.   Objective: Vitals:   07/18/17 1100 07/18/17 1115 07/18/17 1142 07/18/17 1416  BP: (!) 156/89 (!) 155/79 (!) 125/56 (!) 151/79  Pulse: (!) 110 (!) 112 (!) 101 (!) 107  Resp: (!) 21 20 20 20   Temp:    99.1 F (37.3 C)  TempSrc:    Oral  SpO2: 99% 99% 98% 98%  Weight:      Height:        Intake/Output Summary (Last 24 hours) at 07/18/17 1654 Last data filed at 07/18/17 1321  Gross per 24 hour    Intake          1276.67 ml  Output             1975 ml  Net          -698.33 ml   Filed Weights   07/15/17 1707 07/15/17 2300  Weight: 71.7 kg (158 lb) 70.9 kg (156 lb 4.9 oz)   Examination: Physical Exam:  Constitutional: WN/WD Caucasian Male who is encephalopathic  Eyes: Lids and conjunctivae normal, sclerae anicteric. Does not respond to threat ENMT: External Ears, Nose appear normal. Grossly normal hearing. Mucous membranes are moist.  Neck: Appears normal, supple, no cervical masses, normal ROM, no appreciable thyromegaly, no visible JVD Respiratory: Diminished to auscultation bilaterally, no wheezing, rales, rhonchi or crackles. Normal respiratory effort and patient is not tachypenic. No accessory muscle use.  Cardiovascular: Tachycardic Rate abut regular rhythm, no murmurs / rubs / gallops. S1 and S2 auscultated. No appreciable extremity edema. Abdomen: Soft,Tender to palpate and patient grimaces, non-distended.  Bowel sounds positive. Has an abdominal Drain with greyish purulent drainage GU: Deferred. Musculoskeletal: No clubbing / cyanosis of digits/nails.  Skin: No rashes, lesions, ulcers on a limited skin eval. Bilateral Leg ulcers covered Neurologic: Not awake or alert and is encephalopathic. Responds to sternal  rub and painful stimuli.  Psychiatric: Unable to assess due to current condition.   Data Reviewed: I have personally reviewed following labs and imaging studies  CBC:  Recent Labs Lab 07/15/17 1833 07/16/17 0423 07/17/17 0425 07/17/17 1041 07/18/17 1330  WBC 26.2* 22.4* 18.7*  --  13.1*  NEUTROABS 23.7*  --   --   --  10.5*  HGB 8.4* 7.9* 7.2*  --  8.8*  HCT 24.4* 22.7* 21.2* 21.2* 26.0*  MCV 91.4 91.9 92.2  --  89.3  PLT 415* 387 363  --  368   Basic Metabolic Panel:  Recent Labs Lab 07/15/17 1833 07/16/17 0423 07/17/17 0425 07/18/17 1330  NA 133* 135 137 141  K 5.6* 5.1 5.5* 4.8  CL 105 108 112* 109  CO2 15* 14* 12* 14*  GLUCOSE 115* 75 82  84  BUN 93* 91* 91* 101*  CREATININE 3.90* 3.83* 3.97* 4.29*  CALCIUM 8.9 8.4* 8.5* 8.8*  MG  --   --   --  2.0  PHOS  --   --  7.5* 6.4*   GFR: Estimated Creatinine Clearance: 15.8 mL/min (A) (by C-G formula based on SCr of 4.29 mg/dL (H)). Liver Function Tests:  Recent Labs Lab 07/15/17 1833 07/16/17 0423 07/17/17 0425 07/18/17 1330  AST 22 19  --  25  ALT 17 15*  --  16*  ALKPHOS 90 82  --  80  BILITOT 0.5 0.4  --  1.5*  PROT 5.8* 5.3*  --  5.8*  ALBUMIN 2.2* 2.0* 1.9* 2.0*    Recent Labs Lab 07/15/17 1833  LIPASE 41   No results for input(s): AMMONIA in the last 168 hours. Coagulation Profile:  Recent Labs Lab 07/17/17 1041  INR 1.21   Cardiac Enzymes:  Recent Labs Lab 07/15/17 1833  TROPONINI <0.03   BNP (last 3 results) No results for input(s): PROBNP in the last 8760 hours. HbA1C: No results for input(s): HGBA1C in the last 72 hours. CBG: No results for input(s): GLUCAP in the last 168 hours. Lipid Profile: No results for input(s): CHOL, HDL, LDLCALC, TRIG, CHOLHDL, LDLDIRECT in the last 72 hours. Thyroid Function Tests:  Recent Labs  07/17/17 0425  TSH 1.716   Anemia Panel:  Recent Labs  07/17/17 0425  VITAMINB12 3,352*  FOLATE 16.1  FERRITIN 934*  TIBC NOT CALCULATED  IRON 28*   Sepsis Labs: No results for input(s): PROCALCITON, LATICACIDVEN in the last 168 hours.  Recent Results (from the past 240 hour(s))  Aerobic/Anaerobic Culture (surgical/deep wound)     Status: None (Preliminary result)   Collection Time: 07/18/17 11:42 AM  Result Value Ref Range Status   Specimen Description ABDOMEN  Final   Special Requests Normal  Final   Gram Stain   Final    ABUNDANT WBC PRESENT, PREDOMINANTLY PMN ABUNDANT GRAM NEGATIVE RODS ABUNDANT GRAM POSITIVE COCCI MODERATE GRAM POSITIVE RODS    Culture PENDING  Incomplete   Report Status PENDING  Incomplete    Radiology Studies: Ct Abdomen Pelvis Wo Contrast  Result Date:  07/16/2017 CLINICAL DATA:  Hematuria of unknown cause EXAM: CT ABDOMEN AND PELVIS WITHOUT CONTRAST TECHNIQUE: Multidetector CT imaging of the abdomen and pelvis was performed following the standard protocol without IV contrast. COMPARISON:  07/16/2017 FINDINGS: Lower chest: Lung bases demonstrate mild dependent atelectasis. Small right-sided pleural effusion. Coronary calcifications. Heart size upper limits of normal. Hepatobiliary: Nodular liver contour suspect for cirrhosis. Calcified gallstone. No biliary dilatation. Pancreas: Unremarkable. No pancreatic ductal dilatation or  surrounding inflammatory changes. Spleen: Normal in size without focal abnormality. Probable accessory splenule posterior to the spleen. Adrenals/Urinary Tract: Adrenal glands are within normal limits. No hydronephrosis. Urinary bladder slightly thick walled but underdistended Stomach/Bowel: Stomach nonenlarged. No dilated small bowel. No colon wall thickening. Normal appendix. Sigmoid colon diverticular disease without acute inflammation. Vascular/Lymphatic: Aortic atherosclerosis. No aneurysmal dilatation. No significantly enlarged abdominal or pelvic lymph nodes. Reproductive: Prostate is unremarkable. Other: Small amount of ascites in the pelvis. Large Terry hepatic gas and fluid collection measuring 16 cm AP by 3.6 cm thick by 21.6 cm cranial caudad. It is probably contiguous with an additional gas and fluid collection in the right lower quadrant measuring 11 cm. No significant rim enhancement. Some mass effect on the adjacent liver. No free air is seen. Musculoskeletal: Scoliosis and degenerative changes of the spine. Post sternotomy changes. No acute or suspicious bone lesion. IMPRESSION: 1. Large perihepatic gas and fluid collection that probably communicates with an additional large gas and fluid collection in the right lower quadrant of the abdomen. Differential considerations include loculated ascites with superimposed gas-forming  infection, possible contained perforation although no diseased segments of large or small bowel are seen, and given the somewhat subcapsular appearance superiorly, old hematoma, possibly with infection. 2. Nodular contour of the liver suggests cirrhosis 3. Small moderate pelvic ascites 4. Sigmoid colon diverticular disease without acute inflammation 5. Gallstone Electronically Signed   By: Jasmine Pang M.D.   On: 07/16/2017 20:02   Ct Head Wo Contrast  Result Date: 07/18/2017 CLINICAL DATA:  Altered level of consciousness, unexplained. EXAM: CT HEAD WITHOUT CONTRAST TECHNIQUE: Contiguous axial images were obtained from the base of the skull through the vertex without intravenous contrast. COMPARISON:  09/08/2005 FINDINGS: Brain: No acute finding by CT. Generalized brain atrophy. Chronic small-vessel ischemic changes throughout the hemispheric white matter, basal ganglia and thalami I. No mass lesion, hemorrhage, hydrocephalus or extra-axial collection. Vascular: There is atherosclerotic calcification of the major vessels at the base of the brain. Skull: Negative Sinuses/Orbits: Clear/normal Other: None IMPRESSION: No acute finding by CT. Atrophy and chronic small-vessel ischemic changes. Electronically Signed   By: Paulina Fusi M.D.   On: 07/18/2017 15:55   Dg Chest Port 1 View  Result Date: 07/18/2017 CLINICAL DATA:  Status post placement of a perihepatic drain for abscess today. EXAM: PORTABLE CHEST 1 VIEW COMPARISON:  CT abdomen and pelvis 07/16/2017 and images from drain placement today. PA and lateral chest 07/15/2017. FINDINGS: Drainage catheter is identified in the right upper quadrant of the abdomen. There is mild elevation the right hemidiaphragm relative to the left, unchanged. Lungs are clear. No pneumothorax or pleural effusion. Patient status post median sternotomy. Heart size is upper normal. IMPRESSION: No acute cardiopulmonary disease. Drainage catheter in the right upper quadrant of the  abdomen. Electronically Signed   By: Drusilla Kanner M.D.   On: 07/18/2017 14:10   Ct Image Guided Drainage By Percutaneous Catheter  Result Date: 07/18/2017 INDICATION: Large right intra-abdominal abscess EXAM: CT DRAINAGE RIGHT INTRA-ABDOMINAL ABSCESS MEDICATIONS: The patient is currently admitted to the hospital and receiving intravenous antibiotics. The antibiotics were administered within an appropriate time frame prior to the initiation of the procedure. ANESTHESIA/SEDATION: Moderate Sedation Time:  None. The patient was continuously monitored during the procedure by the interventional radiology nurse under my direct supervision. COMPLICATIONS: None immediate. PROCEDURE: Informed written consent was obtained from the patient and his family after a thorough discussion of the procedural risks, benefits and alternatives. All questions were  addressed. Maximal Sterile Barrier Technique was utilized including caps, mask, sterile gowns, sterile gloves, sterile drape, hand hygiene and skin antiseptic. A timeout was performed prior to the initiation of the procedure. Previous imaging reviewed. Patient positioned supine. Noncontrast localization CT performed to localize the right abdominal large abscess. Overlying skin marked in the right mid abdomen. Under sterile conditions and local anesthesia, an 18 gauge 10 cm access needle was advanced percutaneously into the large fluid collection. Needle position confirmed with CT. Amplatz guidewire inserted followed by tract dilatation to insert a 16 French abscess drain. Drain catheter position confirmed with CT. Syringe aspiration yielded 1100 cc purulent fluid compatible with abscess. Sample sent for Gram stain and culture. Catheter secured with a Prolene suture and connected to external gravity drainage bag. Patient tolerated the procedure well. No immediate complication. IMPRESSION: Successful CT-guided right abdominal abscess drain catheter insertion. Electronically  Signed   By: Judie Petit.  Shick M.D.   On: 07/18/2017 12:52   Scheduled Meds: . allopurinol  300 mg Oral QODAY  . aspirin EC  81 mg Oral Daily  . cholecalciferol  2,000 Units Oral BID  . collagenase   Topical Daily  . ferrous sulfate  325 mg Oral BID  . heparin  5,000 Units Subcutaneous Q8H  . lidocaine      . metoprolol succinate  12.5 mg Oral Daily  . multivitamin with minerals  1 tablet Oral Daily  . pantoprazole  40 mg Oral Daily  . sodium chloride flush  5 mL Intravenous Q8H  . thiamine  100 mg Oral Daily   Or  . thiamine  100 mg Intravenous Daily   Continuous Infusions: . piperacillin-tazobactam (ZOSYN)  IV 2.25 g (07/18/17 1637)  .  sodium bicarbonate (isotonic) infusion in sterile water 100 mL/hr at 07/18/17 0643    LOS: 3 days   Merlene Laughter, DO [facility] Pager [phone]  If 7PM-7AM, please contact night-coverage [email or web address] Password [facility] 07/18/2017, 4:54 PM

## 2017-07-18 NOTE — Progress Notes (Signed)
Patient arrived back to [address] with new drain in right lower quad. of abdomen.

## 2017-07-18 NOTE — Progress Notes (Signed)
There has been significant improvement in the perihepatic fluid collection.  There are no other significant pathologic findings.  His leukocytosis has improved.  No surgical intervention needed at this time.  Marta LamasJames O. Gae BonWyatt, III, MD, FACS [phone]--[phone] [phone]--office [facility]

## 2017-07-19 LAB — CBC WITH DIFFERENTIAL/PLATELET
BAND NEUTROPHILS: 11 %
BASOS ABS: 0 10*3/uL (ref 0.0–0.1)
BASOS ABS: 0 10*3/uL (ref 0.0–0.1)
BASOS PCT: 0 %
BASOS PCT: 0 %
Blasts: 0 %
EOS ABS: 0 10*3/uL (ref 0.0–0.7)
EOS PCT: 0 %
Eosinophils Absolute: 0 10*3/uL (ref 0.0–0.7)
Eosinophils Relative: 1 %
HEMATOCRIT: 21.4 % — AB (ref 39.0–52.0)
HEMATOCRIT: 22.4 % — AB (ref 39.0–52.0)
HEMOGLOBIN: 7.7 g/dL — AB (ref 13.0–17.0)
Hemoglobin: 7.4 g/dL — ABNORMAL LOW (ref 13.0–17.0)
LYMPHS ABS: 1.7 10*3/uL (ref 0.7–4.0)
LYMPHS PCT: 11 %
Lymphocytes Relative: 16 %
Lymphs Abs: 1 10*3/uL (ref 0.7–4.0)
MCH: 30.2 pg (ref 26.0–34.0)
MCH: 30.3 pg (ref 26.0–34.0)
MCHC: 34.6 g/dL (ref 30.0–36.0)
MCHC: 34.4 g/dL (ref 30.0–36.0)
MCV: 87.3 fL (ref 78.0–100.0)
MCV: 88.2 fL (ref 78.0–100.0)
METAMYELOCYTES PCT: 0 %
MONO ABS: 0.9 10*3/uL (ref 0.1–1.0)
MONO ABS: 1.3 10*3/uL — AB (ref 0.1–1.0)
MONOS PCT: 9 %
Monocytes Relative: 14 %
Myelocytes: 0 %
NEUTROS ABS: 7.8 10*3/uL — AB (ref 1.7–7.7)
NEUTROS ABS: 6.5 10*3/uL (ref 1.7–7.7)
NEUTROS PCT: 74 %
Neutrophils Relative %: 64 %
Other: 0 %
PLATELETS: 283 10*3/uL (ref 150–400)
Platelets: 249 10*3/uL (ref 150–400)
Promyelocytes Absolute: 0 %
RBC: 2.45 MIL/uL — ABNORMAL LOW (ref 4.22–5.81)
RBC: 2.54 MIL/uL — AB (ref 4.22–5.81)
RDW: 12.6 % (ref 11.5–15.5)
RDW: 12.8 % (ref 11.5–15.5)
WBC: 10.4 10*3/uL (ref 4.0–10.5)
WBC: 8.7 10*3/uL (ref 4.0–10.5)
nRBC: 0 /100 WBC

## 2017-07-19 LAB — URINE CULTURE

## 2017-07-19 LAB — COMPREHENSIVE METABOLIC PANEL
ALBUMIN: 1.6 g/dL — AB (ref 3.5–5.0)
ALT: 15 U/L — ABNORMAL LOW (ref 17–63)
ANION GAP: 22 — AB (ref 5–15)
AST: 19 U/L (ref 15–41)
Alkaline Phosphatase: 69 U/L (ref 38–126)
BUN: 98 mg/dL — ABNORMAL HIGH (ref 6–20)
CHLORIDE: 103 mmol/L (ref 101–111)
CO2: 19 mmol/L — AB (ref 22–32)
Calcium: 8.2 mg/dL — ABNORMAL LOW (ref 8.9–10.3)
Creatinine, Ser: 4.35 mg/dL — ABNORMAL HIGH (ref 0.61–1.24)
GFR calc non Af Amer: 12 mL/min — ABNORMAL LOW (ref >60)
GFR, EST AFRICAN AMERICAN: 14 mL/min — AB (ref >60)
GLUCOSE: 95 mg/dL (ref 65–99)
POTASSIUM: 3.4 mmol/L — AB (ref 3.5–5.1)
SODIUM: 144 mmol/L (ref 135–145)
Total Bilirubin: 1.6 mg/dL — ABNORMAL HIGH (ref 0.3–1.2)
Total Protein: 4.9 g/dL — ABNORMAL LOW (ref 6.5–8.1)

## 2017-07-19 LAB — PHOSPHORUS: PHOSPHORUS: 5.8 mg/dL — AB (ref 2.5–4.6)

## 2017-07-19 LAB — MAGNESIUM: Magnesium: 1.7 mg/dL (ref 1.7–2.4)

## 2017-07-19 LAB — HAPTOGLOBIN: Haptoglobin: 170 mg/dL (ref 34–200)

## 2017-07-19 LAB — AMMONIA: AMMONIA: 29 umol/L (ref 9–35)

## 2017-07-19 LAB — HEPATITIS B SURFACE ANTIGEN: Hepatitis B Surface Ag: NEGATIVE

## 2017-07-19 MED ORDER — POTASSIUM CHLORIDE 10 MEQ/100ML IV SOLN
10.0000 meq | INTRAVENOUS | Status: AC
  Administered 2017-07-19 (×3): 10 meq via INTRAVENOUS
  Filled 2017-07-19 (×3): qty 100

## 2017-07-19 MED ORDER — PANTOPRAZOLE SODIUM 40 MG IV SOLR
40.0000 mg | INTRAVENOUS | Status: DC
  Administered 2017-07-19 – 2017-07-22 (×4): 40 mg via INTRAVENOUS
  Filled 2017-07-19 (×4): qty 40

## 2017-07-19 NOTE — Progress Notes (Signed)
Referring Physician(s):  Dr. Renae FickleMackenzie Short  Supervising Physician: Simonne ComeWatts, Jonathan  Patient Status:  [facility]  Chief Complaint: Abdominal fluid collection  Subjective: Open eyes, but remains lethargic and confused.   Allergies: Patient has no known allergies.  Medications: Prior to Admission medications   Medication Sig Start Date End Date Taking? Authorizing Provider  allopurinol (ZYLOPRIM) 300 MG tablet Take 300 mg by mouth daily.   Yes [provider]  aspirin EC 81 MG tablet Take 81 mg by mouth daily.   Yes [provider]  cephALEXin (KEFLEX) 500 MG capsule Take 500 mg by mouth 3 (three) times daily.   Yes [provider]  Cholecalciferol (VITAMIN D3) 2000 units TABS Take 1 tablet by mouth 2 (two) times daily.   Yes [provider]  Ferrous Sulfate (IRON) 325 (65 Fe) MG TABS Take 1 tablet by mouth 2 (two) times daily.    Yes [provider]  furosemide (LASIX) 20 MG tablet Take 20 mg by mouth daily.    Yes [provider]  metoprolol succinate (TOPROL-XL) 25 MG 24 hr tablet Take 12.5 mg by mouth every other day.    Yes [provider]  omeprazole (PRILOSEC) 20 MG capsule Take 20 mg by mouth daily.   Yes [provider]  potassium chloride SA (K-DUR,KLOR-CON) 20 MEQ tablet Take 20 mEq by mouth daily.   Yes [provider]  PREDNISONE PO Take 1 tablet by mouth daily.   Yes [provider]     Vital Signs: BP 136/65   Pulse 80   Temp 98.5 F (36.9 C) (Oral)   Resp 11   Ht 5\' 10"  (1.778 m)   Wt 156 lb 4.9 oz (70.9 kg)   SpO2 97%   BMI 22.43 kg/m   Physical Exam  NAD, lethargic Abd:  Large abdominal drain in place with turbid, green, foul-smelling output. Site intact.  Dressing clean and dry.   Imaging: Ct Abdomen Pelvis Wo Contrast  Result Date: 07/18/2017 CLINICAL DATA:  71 year old male with recent drainage of right abdominal abscess. Follow-up study. EXAM: CT ABDOMEN  AND PELVIS WITHOUT CONTRAST TECHNIQUE: Multidetector CT imaging of the abdomen and pelvis was performed following the standard protocol without IV contrast. COMPARISON:  Abdominal CT dated 07/16/2017 and 07/18/2017 FINDINGS: Evaluation of this exam is limited in the absence of intravenous contrast. Evaluation is also limited due to respiratory motion artifact as well as streak artifact caused by patient's arms. Lower chest: Partially visualized small right pleural effusion with associated mild compressive atelectasis of the right lung base. Left lung base linear atelectasis/ scarring. There is mild cardiomegaly. Multi vessel coronary vascular calcification as well as calcification of the mitral annulus. There is hypoattenuation of the cardiac blood pool suggestive of a degree of anemia. Clinical correlation is recommended. Hepatobiliary: There is mild surface nodularity of the liver concerning for early changes of cirrhosis. Clinical correlation is recommended. No intrahepatic biliary ductal dilatation. The gallbladder is distended. There is a 1.8 cm stone within the gallbladder. No pericholecystic fluid. Pancreas: Unremarkable. No pancreatic ductal dilatation or surrounding inflammatory changes. Spleen: Normal in size without focal abnormality. Adrenals/Urinary Tract: The adrenal glands are unremarkable. There is no hydronephrosis or nephrolithiasis on either side. The visualized ureters appear grossly unremarkable. The urinary bladder is decompressed around a Foley catheter. There is apparent diffuse thickening of the bladder wall which may be partly related to underdistention. Cystitis is not excluded. Correlation with urinalysis recommended. Stomach/Bowel: There is  extensive sigmoid diverticulosis without active inflammatory changes. There is no evidence of bowel obstruction or active inflammation. The appendix is normal. 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 The abdominal  aorta and IVC are otherwise grossly unremarkable on this noncontrast study. No portal venous gas identified. There is no adenopathy. Reproductive: The prostate and seminal vesicles are grossly unremarkable. Other: There has been significant interval decrease in the site of the perihepatic fluid collection compared to the prior studies. A right subcostal approach drainage catheter is seen with tip along the lateral capsule of the right lobe of the liver. Minimal amount of residual fluid and air remains. Small amount of free fluid is also seen within the pelvis. Musculoskeletal: There is diffuse subcutaneous edema and anasarca. No drainable fluid collection. Degenerative changes of the spine and median sternotomy wires. No acute osseous pathology. IMPRESSION: 1. Significant interval resolution of the previously seen perihepatic fluid collection status post drainage catheter placement. The tip of the drainage catheter is positioned along the lateral aspect of the right lobe of the liver superiorly. Very small amount of residual fluid and air remains. 2. Irregular hepatic contour likely changes of cirrhosis. 3. Extensive sigmoid diverticulosis.  No active inflammation. 4. Gallstone. Electronically Signed   By: Elgie Collard M.D.   On: 07/18/2017 20:28   Ct Abdomen Pelvis Wo Contrast  Result Date: 07/16/2017 CLINICAL DATA:  Hematuria of unknown cause EXAM: CT ABDOMEN AND PELVIS WITHOUT CONTRAST TECHNIQUE: Multidetector CT imaging of the abdomen and pelvis was performed following the standard protocol without IV contrast. COMPARISON:  07/16/2017 FINDINGS: Lower chest: Lung bases demonstrate mild dependent atelectasis. Small right-sided pleural effusion. Coronary calcifications. Heart size upper limits of normal. Hepatobiliary: Nodular liver contour suspect for cirrhosis. Calcified gallstone. No biliary dilatation. Pancreas: Unremarkable. No pancreatic ductal dilatation or surrounding inflammatory changes. Spleen:  Normal in size without focal abnormality. Probable accessory splenule posterior to the spleen. Adrenals/Urinary Tract: Adrenal glands are within normal limits. No hydronephrosis. Urinary bladder slightly thick walled but underdistended Stomach/Bowel: Stomach nonenlarged. No dilated small bowel. No colon wall thickening. Normal appendix. Sigmoid colon diverticular disease without acute inflammation. Vascular/Lymphatic: Aortic atherosclerosis. No aneurysmal dilatation. No significantly enlarged abdominal or pelvic lymph nodes. Reproductive: Prostate is unremarkable. Other: Small amount of ascites in the pelvis. Large Terry hepatic gas and fluid collection measuring 16 cm AP by 3.6 cm thick by 21.6 cm cranial caudad. It is probably contiguous with an additional gas and fluid collection in the right lower quadrant measuring 11 cm. No significant rim enhancement. Some mass effect on the adjacent liver. No free air is seen. Musculoskeletal: Scoliosis and degenerative changes of the spine. Post sternotomy changes. No acute or suspicious bone lesion. IMPRESSION: 1. Large perihepatic gas and fluid collection that probably communicates with an additional large gas and fluid collection in the right lower quadrant of the abdomen. Differential considerations include loculated ascites with superimposed gas-forming infection, possible contained perforation although no diseased segments of large or small bowel are seen, and given the somewhat subcapsular appearance superiorly, old hematoma, possibly with infection. 2. Nodular contour of the liver suggests cirrhosis 3. Small moderate pelvic ascites 4. Sigmoid colon diverticular disease without acute inflammation 5. Gallstone Electronically Signed   By: Jasmine Pang M.D.   On: 07/16/2017 20:02   Dg Chest 2 View  Result Date: 07/15/2017 CLINICAL DATA:  Subacute onset of generalized weakness and hypotension. Initial encounter. EXAM: CHEST  2 VIEW COMPARISON:  Chest radiograph  performed 12/16/2012 FINDINGS: There is mild elevation  of the right hemidiaphragm. Mild bilateral atelectasis is noted. No significant pleural effusion or pneumothorax is seen. The heart is mildly enlarged. The patient is status post median sternotomy. A large hiatal hernia is noted. No acute osseous abnormalities are seen. IMPRESSION: 1. Mild elevation of the right hemidiaphragm. Mild bibasilar atelectasis. 2. Mild cardiomegaly. 3. Large hiatal hernia. Electronically Signed   By: Roanna Raider M.D.   On: 07/15/2017 21:42   Ct Head Wo Contrast  Result Date: 07/18/2017 CLINICAL DATA:  Altered level of consciousness, unexplained. EXAM: CT HEAD WITHOUT CONTRAST TECHNIQUE: Contiguous axial images were obtained from the base of the skull through the vertex without intravenous contrast. COMPARISON:  09/08/2005 FINDINGS: Brain: No acute finding by CT. Generalized brain atrophy. Chronic small-vessel ischemic changes throughout the hemispheric white matter, basal ganglia and thalami I. No mass lesion, hemorrhage, hydrocephalus or extra-axial collection. Vascular: There is atherosclerotic calcification of the major vessels at the base of the brain. Skull: Negative Sinuses/Orbits: Clear/normal Other: None IMPRESSION: No acute finding by CT. Atrophy and chronic small-vessel ischemic changes. Electronically Signed   By: Paulina Fusi M.D.   On: 07/18/2017 15:55   US Renal  Result Date: 07/16/2017 CLINICAL DATA:  Acute kidney injury. EXAM: RENAL / URINARY TRACT ULTRASOUND COMPLETE COMPARISON:  10/05/2016 abdominal ultrasound report (images not available at the time of this dictation) FINDINGS: Right Kidney: Length: 11.3 cm. Echogenicity within normal limits. No hydronephrosis. 1.2 cm minimally complex cyst containing some low-level internal echoes in the upper pole, unchanged in size from that reported on the prior ultrasound. Left Kidney: Length: 10.9 cm. Echogenicity within normal limits. No hydronephrosis. 1.3 cm  minimally complex cyst containing some low-level internal echoes in the interpolar kidney, unchanged in size from the cyst described on the prior ultrasound. Bladder: Appears normal for degree of bladder distention. Partially visualized complex fluid adjacent to the liver with internal motion, right pleural effusion, and shadowing gallstone. IMPRESSION: 1. No hydronephrosis. 2. Minimally complex bilateral renal cysts, unchanged in size from this reported on the prior ultrasound. 3. Partially visualized complex fluid adjacent to the liver. It is not clear whether this is within bowel or reflects complex ascites or a perihepatic fluid collection. Consider abdominal CT for further evaluation. 4. Right pleural effusion. 5. Cholelithiasis. Electronically Signed   By: Sebastian Ache M.D.   On: 07/16/2017 10:46   US Arterial Abi (screening Lower Extremity)  Result Date: 07/16/2017 CLINICAL DATA:  71 year old male with lower extremity ulceration and edema. EXAM: NONINVASIVE PHYSIOLOGIC VASCULAR STUDY OF BILATERAL LOWER EXTREMITIES TECHNIQUE: Evaluation of both lower extremities were performed at rest, including calculation of ankle-brachial indices with single level Doppler, pressure and pulse volume recording. COMPARISON:  None. FINDINGS: Right ABI:  1.2 Left ABI:  1.2 Right Lower Extremity:  Normal arterial waveforms at the ankle. Left Lower Extremity:  Normal arterial waveforms at the ankle. IMPRESSION: Normal bilateral resting ankle-brachial indices and arterial waveforms. Electronically Signed   By: Malachy Moan M.D.   On: 07/16/2017 13:38   Dg Chest Port 1 View  Result Date: 07/18/2017 CLINICAL DATA:  Status post placement of a perihepatic drain for abscess today. EXAM: PORTABLE CHEST 1 VIEW COMPARISON:  CT abdomen and pelvis 07/16/2017 and images from drain placement today. PA and lateral chest 07/15/2017. FINDINGS: Drainage catheter is identified in the right upper quadrant of the abdomen. There is mild  elevation the right hemidiaphragm relative to the left, unchanged. Lungs are clear. No pneumothorax or pleural effusion. Patient status post median sternotomy.  Heart size is upper normal. IMPRESSION: No acute cardiopulmonary disease. Drainage catheter in the right upper quadrant of the abdomen. Electronically Signed   By: Drusilla Kanner M.D.   On: 07/18/2017 14:10   Ct Image Guided Drainage By Percutaneous Catheter  Result Date: 07/18/2017 INDICATION: Large right intra-abdominal abscess EXAM: CT DRAINAGE RIGHT INTRA-ABDOMINAL ABSCESS MEDICATIONS: The patient is currently admitted to the hospital and receiving intravenous antibiotics. The antibiotics were administered within an appropriate time frame prior to the initiation of the procedure. ANESTHESIA/SEDATION: Moderate Sedation Time:  None. The patient was continuously monitored during the procedure by the interventional radiology nurse under my direct supervision. COMPLICATIONS: None immediate. PROCEDURE: Informed written consent was obtained from the patient and his family after a thorough discussion of the procedural risks, benefits and alternatives. All questions were addressed. Maximal Sterile Barrier Technique was utilized including caps, mask, sterile gowns, sterile gloves, sterile drape, hand hygiene and skin antiseptic. A timeout was performed prior to the initiation of the procedure. Previous imaging reviewed. Patient positioned supine. Noncontrast localization CT performed to localize the right abdominal large abscess. Overlying skin marked in the right mid abdomen. Under sterile conditions and local anesthesia, an 18 gauge 10 cm access needle was advanced percutaneously into the large fluid collection. Needle position confirmed with CT. Amplatz guidewire inserted followed by tract dilatation to insert a 16 French abscess drain. Drain catheter position confirmed with CT. Syringe aspiration yielded 1100 cc purulent fluid compatible with abscess.  Sample sent for Gram stain and culture. Catheter secured with a Prolene suture and connected to external gravity drainage bag. Patient tolerated the procedure well. No immediate complication. IMPRESSION: Successful CT-guided right abdominal abscess drain catheter insertion. Electronically Signed   By: Judie Petit.  Shick M.D.   On: 07/18/2017 12:52    Labs:  CBC:  Recent Labs  07/17/17 0425 07/17/17 1041 07/18/17 1330 07/19/17 0548 07/19/17 1147  WBC 18.7*  --  13.1* 10.4 8.7  HGB 7.2*  --  8.8* 7.4* 7.7*  HCT 21.2* 21.2* 26.0* 21.4* 22.4*  PLT 363  --  368 283 249    COAGS:  Recent Labs  07/17/17 1041 07/18/17 1640  INR 1.21 1.21  APTT  --  33    BMP:  Recent Labs  07/16/17 0423 07/17/17 0425 07/18/17 1330 07/19/17 0548  NA 135 137 141 144  K 5.1 5.5* 4.8 3.4*  CL 108 112* 109 103  CO2 14* 12* 14* 19*  GLUCOSE 75 82 84 95  BUN 91* 91* 101* 98*  CALCIUM 8.4* 8.5* 8.8* 8.2*  CREATININE 3.83* 3.97* 4.29* 4.35*  GFRNONAA 15* 14* 13* 12*  GFRAA 17* 16* 15* 14*    LIVER FUNCTION TESTS:  Recent Labs  07/15/17 1833 07/16/17 0423 07/17/17 0425 07/18/17 1330 07/19/17 0548  BILITOT 0.5 0.4  --  1.5* 1.6*  AST 22 19  --  25 19  ALT 17 15*  --  16* 15*  ALKPHOS 90 82  --  80 69  PROT 5.8* 5.3*  --  5.8* 4.9*  ALBUMIN 2.2* 2.0* 1.9* 2.0* 1.6*    Assessment and Plan: Intra-abdominal fluid collection s/p drain placement 10/24 by Dr. Miles Costain Large amount of fluid removed from peritoneal cavity yesterday.  Continues with increased green output-- 575 mL output yesterday, 100 mL recorded so far today.  Discussed with Dr. Grace Isaac. Imaging reviewed; note mobile stone in gall bladder, but no overt signs of leak/fluid source.  Will send fluid to check bilirubin content.  Culture pending  but showing multiple organisms.  WBC improved from 13.1  8.7.  Afebrile.  IR to follow.   Electronically Signed: Hoyt Koch, PA 07/19/2017, 3:14 PM   I spent a total of 15  Minutes at the the patient's bedside AND on the patient's hospital floor or unit, greater than 50% of which was counseling/coordinating care for intra-abdominal fluid collection.

## 2017-07-19 NOTE — Progress Notes (Signed)
Subjective/Chief Complaint: Patient is a bit more alert this morning Family at bedside CT scan - interval improvement in abscess - no sign of gastric or SB pathology Had Bm yesterday   Objective: Vital signs in last 24 hours: Temp:  [98.4 F (36.9 C)-99.1 F (37.3 C)] 99 F (37.2 C) (10/25 0754) Pulse Rate:  [88-114] 95 (10/25 0800) Resp:  [12-25] 14 (10/25 0800) BP: (123-156)/(56-89) 155/81 (10/25 0800) SpO2:  [97 %-99 %] 98 % (10/25 0800) Last BM Date: 07/18/17  Intake/Output from previous day: 10/24 0701 - 10/25 0700 In: 2860 [I.V.:2710; IV Piggyback:150] Out: 1950 [NLZJQ:7341; Drains:575] Intake/Output this shift: No intake/output data recorded.  Elderly male - resting comfortably Arousable to voice Abd - healed midline incision; drain in RUQ; mild RUQ tenderness Drainage - purulent light green fluid in bag  Lab Results:   Recent Labs  07/18/17 1330 07/19/17 0548  WBC 13.1* 10.4  HGB 8.8* 7.4*  HCT 26.0* 21.4*  PLT 368 283   BMET  Recent Labs  07/18/17 1330 07/19/17 0548  NA 141 144  K 4.8 3.4*  CL 109 103  CO2 14* 19*  GLUCOSE 84 95  BUN 101* 98*  CREATININE 4.29* 4.35*  CALCIUM 8.8* 8.2*   Hepatic Function Latest Ref Rng & Units 07/19/2017 07/18/2017 07/17/2017  Total Protein 6.5 - 8.1 g/dL 4.9(L) 5.8(L) -  Albumin 3.5 - 5.0 g/dL 1.6(L) 2.0(L) 1.9(L)  AST 15 - 41 U/L 19 25 -  ALT 17 - 63 U/L 15(L) 16(L) -  Alk Phosphatase 38 - 126 U/L 69 80 -  Total Bilirubin 0.3 - 1.2 mg/dL 1.6(H) 1.5(H) -    PT/INR  Recent Labs  07/17/17 1041 07/18/17 1640  LABPROT 15.2 15.2  INR 1.21 1.21   ABG No results for input(s): PHART, HCO3 in the last 72 hours.  Invalid input(s): PCO2, PO2  Studies/Results: Ct Abdomen Pelvis Wo Contrast  Result Date: 07/18/2017 CLINICAL DATA:  71 year old male with recent drainage of right abdominal abscess. Follow-up study. EXAM: CT ABDOMEN AND PELVIS WITHOUT CONTRAST TECHNIQUE: Multidetector CT imaging of the  abdomen and pelvis was performed following the standard protocol without IV contrast. COMPARISON:  Abdominal CT dated 07/16/2017 and 07/18/2017 FINDINGS: Evaluation of this exam is limited in the absence of intravenous contrast. Evaluation is also limited due to respiratory motion artifact as well as streak artifact caused by patient's arms. Lower chest: Partially visualized small right pleural effusion with associated mild compressive atelectasis of the right lung base. Left lung base linear atelectasis/ scarring. There is mild cardiomegaly. Multi vessel coronary vascular calcification as well as calcification of the mitral annulus. There is hypoattenuation of the cardiac blood pool suggestive of a degree of anemia. Clinical correlation is recommended. Hepatobiliary: There is mild surface nodularity of the liver concerning for early changes of cirrhosis. Clinical correlation is recommended. No intrahepatic biliary ductal dilatation. The gallbladder is distended. There is a 1.8 cm stone within the gallbladder. No pericholecystic fluid. Pancreas: Unremarkable. No pancreatic ductal dilatation or surrounding inflammatory changes. Spleen: Normal in size without focal abnormality. Adrenals/Urinary Tract: The adrenal glands are unremarkable. There is no hydronephrosis or nephrolithiasis on either side. The visualized ureters appear grossly unremarkable. The urinary bladder is decompressed around a Foley catheter. There is apparent diffuse thickening of the bladder wall which may be partly related to underdistention. Cystitis is not excluded. Correlation with urinalysis recommended. Stomach/Bowel: There is extensive sigmoid diverticulosis without active inflammatory changes. There is no evidence of bowel obstruction or active  inflammation. The appendix is normal. 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 The abdominal aorta and IVC are otherwise grossly unremarkable on this noncontrast  study. No portal venous gas identified. There is no adenopathy. Reproductive: The prostate and seminal vesicles are grossly unremarkable. Other: There has been significant interval decrease in the site of the perihepatic fluid collection compared to the prior studies. A right subcostal approach drainage catheter is seen with tip along the lateral capsule of the right lobe of the liver. Minimal amount of residual fluid and air remains. Small amount of free fluid is also seen within the pelvis. Musculoskeletal: There is diffuse subcutaneous edema and anasarca. No drainable fluid collection. Degenerative changes of the spine and median sternotomy wires. No acute osseous pathology. IMPRESSION: 1. Significant interval resolution of the previously seen perihepatic fluid collection status post drainage catheter placement. The tip of the drainage catheter is positioned along the lateral aspect of the right lobe of the liver superiorly. Very small amount of residual fluid and air remains. 2. Irregular hepatic contour likely changes of cirrhosis. 3. Extensive sigmoid diverticulosis.  No active inflammation. 4. Gallstone. Electronically Signed   By: Anner Crete M.D.   On: 07/18/2017 20:28   Ct Head Wo Contrast  Result Date: 07/18/2017 CLINICAL DATA:  Altered level of consciousness, unexplained. EXAM: CT HEAD WITHOUT CONTRAST TECHNIQUE: Contiguous axial images were obtained from the base of the skull through the vertex without intravenous contrast. COMPARISON:  09/08/2005 FINDINGS: Brain: No acute finding by CT. Generalized brain atrophy. Chronic small-vessel ischemic changes throughout the hemispheric white matter, basal ganglia and thalami I. No mass lesion, hemorrhage, hydrocephalus or extra-axial collection. Vascular: There is atherosclerotic calcification of the major vessels at the base of the brain. Skull: Negative Sinuses/Orbits: Clear/normal Other: None IMPRESSION: No acute finding by CT. Atrophy and chronic  small-vessel ischemic changes. Electronically Signed   By: Nelson Chimes M.D.   On: 07/18/2017 15:55   Dg Chest Port 1 View  Result Date: 07/18/2017 CLINICAL DATA:  Status post placement of a perihepatic drain for abscess today. EXAM: PORTABLE CHEST 1 VIEW COMPARISON:  CT abdomen and pelvis 07/16/2017 and images from drain placement today. PA and lateral chest 07/15/2017. FINDINGS: Drainage catheter is identified in the right upper quadrant of the abdomen. There is mild elevation the right hemidiaphragm relative to the left, unchanged. Lungs are clear. No pneumothorax or pleural effusion. Patient status post median sternotomy. Heart size is upper normal. IMPRESSION: No acute cardiopulmonary disease. Drainage catheter in the right upper quadrant of the abdomen. Electronically Signed   By: Inge Rise M.D.   On: 07/18/2017 14:10   Ct Image Guided Drainage By Percutaneous Catheter  Result Date: 07/18/2017 INDICATION: Large right intra-abdominal abscess EXAM: CT DRAINAGE RIGHT INTRA-ABDOMINAL ABSCESS MEDICATIONS: The patient is currently admitted to the hospital and receiving intravenous antibiotics. The antibiotics were administered within an appropriate time frame prior to the initiation of the procedure. ANESTHESIA/SEDATION: Moderate Sedation Time:  None. The patient was continuously monitored during the procedure by the interventional radiology nurse under my direct supervision. COMPLICATIONS: None immediate. PROCEDURE: Informed written consent was obtained from the patient and his family after a thorough discussion of the procedural risks, benefits and alternatives. All questions were addressed. Maximal Sterile Barrier Technique was utilized including caps, mask, sterile gowns, sterile gloves, sterile drape, hand hygiene and skin antiseptic. A timeout was performed prior to the initiation of the procedure. Previous imaging reviewed. Patient positioned supine. Noncontrast localization CT performed to  localize  the right abdominal large abscess. Overlying skin marked in the right mid abdomen. Under sterile conditions and local anesthesia, an 18 gauge 10 cm access needle was advanced percutaneously into the large fluid collection. Needle position confirmed with CT. Amplatz guidewire inserted followed by tract dilatation to insert a 16 French abscess drain. Drain catheter position confirmed with CT. Syringe aspiration yielded 1100 cc purulent fluid compatible with abscess. Sample sent for Gram stain and culture. Catheter secured with a Prolene suture and connected to external gravity drainage bag. Patient tolerated the procedure well. No immediate complication. IMPRESSION: Successful CT-guided right abdominal abscess drain catheter insertion. Electronically Signed   By: Jerilynn Mages.  Shick M.D.   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 07/19/2017

## 2017-07-19 NOTE — Progress Notes (Signed)
PROGRESS NOTE    Jonathan Walker  ONG:[id number] DOB: [birth year] DOA: 07/15/2017 PCP: Samuel Jester, DO   Brief Narrative:  Patient is a 71 year old CAD s/p CABG, hypertension, hyperlipidemia, gout who resents with weakness and was found to have AKI.  His lasix was increased from every other day to daily dosing about 1 month ago.  For the last few days, however, he has not made very much urine output and he has become very fatigued.  Additionally, he was seen by dermatology, Dr. Margo Aye this month for some new right lower extremity ulcers.  They were biopsied and biopsy demonstrated no evidence of vasculitis, but sample was superficial and nonspecific.  He was diagnosed with polyarteritis nodosum and given keflex and prednisone taper which have helped his ulcers.  He had a weakly positive ANA and CANCA, both titers were 1:80 earlier this month. Creatinine has worsened and patient was transferred to [facility] and had a drain placed for an intra-abdominal abscess. He was confused this AM but more awake.   Assessment & Plan:   Active Problems:   AKI (acute kidney injury) (HCC)   Polyarteritis nodosa (HCC)   Normocytic anemia   Intra-abdominal abscess (HCC)   Acute encephalopathy   CAD (coronary artery disease)   Gout   Essential hypertension   Ulcers of both lower legs (HCC)   Hyperammonemia (HCC)   History of alcoholism (HCC)  Acute kidney injury with microscopic Hematuria/Evidence of possible nephritis.   -Baseline creatinine is 0.9 on May 8th, 2018.  On and at that time his lasix dose was increased.   -Creatinine 1.38 on Oct 1st.  Possible this may all be due to ATN from dehydration/overdiuresis, but autoimmune disorder also possible. Cr has worsened in the setting of Abdominal Infection/Abscess and Cr is now 4.35 -Nephrology consultation appreciated -ANA has been repeated -Mpo/pr-3 ANCA Ab WNL  -Complement:  C3 70 (Low), CH 50 and C4 wnl -HCV negative and HBV pending -HIV  Non-Reactive -UPEP being collected -Change to bicarb containing IVF; Bicarb gtt decreased to 50 mL/hr by Nephro -Started Lasix for hyperkalemia but held by Nephrology  -Patiromer stopped Nephrology -Avoid Kayexelate in setting of occult positive stool and unusual abdominal fluid collection -Renal US:  Minimally complex bilateral renal cysts, unchanged from prior -May need renal biopsy but will hold off for now and if Cr still stays up will biopsy in a few weeks. 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  -C/w IV Zosyn   Intra-abdominal Abscess s/p Drain by IR -May be infected ascites given suggestion of cirrhosis versus blood versus contained bowel perforation; Suspect likely from bowel or enteric source -CT scan: Large fluid collection around the liver extending into the right lower quadrant with air fluid level -Transferred to [facility] for IR paracentesis and drain placement -IR drained 1.1 Liter of purulent discharge and drainage tube showed cloudy grey purulent discharge  -IR sending Fluid for Bilirubin and if elevated will proceed with HIDA Scan to evaluate for Bile Leak  -Ordered paracentesis labs with cytology and a hematocrit (in case it's bloody) -Patient was Pan-Cultured (Blood, Urine (Urine showed Multiple species present so will reculture) CXR) and started on Empiric Zosyn -Abscess Gram Stain showed Abundant WBC's, Abundant GNR, Abundant Gram + Cocci and Gram + Rods and pending Cx -Blood Cx show NGTD <24 hours  -Abdominal CT Scan repeated ordered and showed significant interval resolution of perihepatic fluid collection s/p catheter drainage and very small  amount of residual fluid and air remains  Acute Encephalopathy, improved -Received 1 mg of Ativan 10/23 for CIWA but doubt his Encephalopathy is explained by that -Transferred to SDU for Closer Evaluation  -Ordered Head CT Scan w/o Contrast and showed  no Acute Finding by CT but showed atrophy and chronic small-vessel ischemich cangeds -C/w Bicarb gtt (150 mEQ) at 50 mL/hr (decreased by Nephro) -Checked Ammonia Level and decreased from 52 -> 29 -Check RPR -If not improving will order MRI and EEG and discuss with Neurology   Coronary Artery Disease -Stable -Continue aspirin and beta blocker -Not on statin  Gout -Stable -Continue allopurinol  Hypertension -Blood pressure low normal -Hold parameter for metoprolol and continue at 12.5 mg po Daily   Leg ulcers,  -possible polyarteritis nodosa, improved with antibiotics and steroids -Good pedal pulse -ABI wnl -Appreciate wound care:  Santyl with dry dressings daily  Leukocytosis likely from Intra-abdominal Abscess -Less Likely from Steroids] -Started Patient on Emprici Zosyn -Pan-Culture and repeat Abdominal CT Scan  -WBC went from 22.4 -> 18.7 -> 13.1 -> 8.7 -Repeat CBC in AM  Anemia of inflammation and possible renal disease and slow GI losses.  -Hemoglobin continued to decline which may be hemodilutional but also consider blood loss around liver? -Iron studies suggest inflammation, B12 HIGH, folate wnl, SPEP pending -TSH 1.76 -Occult stool POSITIVE -C/w Ferrous Sulfate 325 mg po BID - Nephrology ordering Hemolysis Labs -Hb/Hct went from 7.2/21.2 -> 8.8/26.0 -> 7.4/21.4 -> 7.7/22.4 -Transfuse as Necessary  -Continue to Monitor and Repeat CBC in AM   Hx of EtOH Abuse/Concern for Withdrawl -Patient's family states he drinks heavily -Placed on CIWA and given 1 mg of Ativan last night   Hyperammonemia, improved -Patient's Ammonia Level 52 and improved to 29 -Repeat Ammonia Level in AM and await Repeat CT Scan findings prior to administration of Lactulose   Liver Cirrhosis -Continue to Monitor LFT's -Repeat CMP in AM   DVT prophylaxis: Heparin 5,000 sq  q8h Code Status: DO NOT RESUSCITATE  Family Communication: Discussed with Wife and Daughters at  beside Disposition Plan: Transfer to SDU as patient is worsening; SNF when medically approved   Consultants:   Nephrology  General Surgery  Interventional Radiology   Procedures:  S/p CT DRAIN RT ABDOMEN   Antimicrobials: Anti-infectives    Start     Dose/Rate Route Frequency Ordered Stop   07/18/17 1300  piperacillin-tazobactam (ZOSYN) IVPB 2.25 g     2.25 g 100 mL/hr over 30 Minutes Intravenous Every 6 hours 07/18/17 1245       Subjective: Seen and examined and was still confused but more awake and would answer questions and say yes. Wife states he has been fidgety. No Nausea or Vomiting. Wearing safety mitts.   Objective: Vitals:   07/19/17 1148 07/19/17 1200 07/19/17 1300 07/19/17 1544  BP: (!) 118/93 (!) 162/67 136/65 (!) 150/76  Pulse: (!) 101 97 80 94  Resp: 17 14 11 18   Temp: 98.5 F (36.9 C)   98.3 F (36.8 C)  TempSrc: Oral   Oral  SpO2: 97% 97% 97% 99%  Weight:      Height:        Intake/Output Summary (Last 24 hours) at 07/19/17 1909 Last data filed at 07/19/17 1736  Gross per 24 hour  Intake          2330.83 ml  Output             1950 ml  Net  380.83 ml   Filed Weights   07/15/17 1707 07/15/17 2300  Weight: 71.7 kg (158 lb) 70.9 kg (156 lb 4.9 oz)   Examination: Physical Exam:  Constitutional: WN/WD Caucasian male who is more alert today but still remains extremely confused.  Eyes: Sclerae anicteric. Lids appear normal. ENMT: External ears and nose appear normal Neck: Supple with no JVD Respiratory: Diminished to auscultation bilaterally. No appreciable wheezing/rales/rhonchi. Patient was slightly tachypenic Cardiovascular: Tachycardic rate but regular rhythm. 2/6 Systolic Murmur. 1-2+ LE Edema Abdomen: Soft, Tender to palpate. ND. Bowel sounds present and has a Drain with green purulent drainage which is extremely foul smelling GU: Deferred. Has foley catheter in  Musculoskeletal: No contractures. No cyanosis Skin: Warm and  dry. Has bilateral LE ulcerations that are covered.  Has Drain in place with green purulent material which is extremely foul smelling Neurologic: CN 2-12 grossly intact. Moves all extremities independently  Psychiatric: Slightly agitated. Impaired judgement and insight. Awake but not alert or oriented.   Data Reviewed: I have personally reviewed following labs and imaging studies  CBC:  Recent Labs Lab 07/15/17 1833 07/16/17 0423 07/17/17 0425 07/17/17 1041 07/18/17 1330 07/19/17 0548 07/19/17 1147  WBC 26.2* 22.4* 18.7*  --  13.1* 10.4 8.7  NEUTROABS 23.7*  --   --   --  10.5* 7.8* 6.5  HGB 8.4* 7.9* 7.2*  --  8.8* 7.4* 7.7*  HCT 24.4* 22.7* 21.2* 21.2* 26.0* 21.4* 22.4*  MCV 91.4 91.9 92.2  --  89.3 87.3 88.2  PLT 415* 387 363  --  368 283 249   Basic Metabolic Panel:  Recent Labs Lab 07/15/17 1833 07/16/17 0423 07/17/17 0425 07/18/17 1330 07/19/17 0548  NA 133* 135 137 141 144  K 5.6* 5.1 5.5* 4.8 3.4*  CL 105 108 112* 109 103  CO2 15* 14* 12* 14* 19*  GLUCOSE 115* 75 82 84 95  BUN 93* 91* 91* 101* 98*  CREATININE 3.90* 3.83* 3.97* 4.29* 4.35*  CALCIUM 8.9 8.4* 8.5* 8.8* 8.2*  MG  --   --   --  2.0 1.7  PHOS  --   --  7.5* 6.4* 5.8*   GFR: Estimated Creatinine Clearance: 15.6 mL/min (A) (by C-G formula based on SCr of 4.35 mg/dL (H)). Liver Function Tests:  Recent Labs Lab 07/15/17 1833 07/16/17 0423 07/17/17 0425 07/18/17 1330 07/19/17 0548  AST 22 19  --  25 19  ALT 17 15*  --  16* 15*  ALKPHOS 90 82  --  80 69  BILITOT 0.5 0.4  --  1.5* 1.6*  PROT 5.8* 5.3*  --  5.8* 4.9*  ALBUMIN 2.2* 2.0* 1.9* 2.0* 1.6*    Recent Labs Lab 07/15/17 1833  LIPASE 41    Recent Labs Lab 07/18/17 1640 07/19/17 0548  AMMONIA 52* 29   Coagulation Profile:  Recent Labs Lab 07/17/17 1041 07/18/17 1640  INR 1.21 1.21   Cardiac Enzymes:  Recent Labs Lab 07/15/17 1833  TROPONINI <0.03   BNP (last 3 results) No results for input(s): PROBNP in the  last 8760 hours. HbA1C: No results for input(s): HGBA1C in the last 72 hours. CBG: No results for input(s): GLUCAP in the last 168 hours. Lipid Profile: No results for input(s): CHOL, HDL, LDLCALC, TRIG, CHOLHDL, LDLDIRECT in the last 72 hours. Thyroid Function Tests:  Recent Labs  07/17/17 0425  TSH 1.716   Anemia Panel:  Recent Labs  07/17/17 0425  VITAMINB12 3,352*  FOLATE 16.1  FERRITIN 934*  TIBC NOT CALCULATED  IRON 28*   Sepsis Labs: No results for input(s): PROCALCITON, LATICACIDVEN in the last 168 hours.  Recent Results (from the past 240 hour(s))  Aerobic/Anaerobic Culture (surgical/deep wound)     Status: None (Preliminary result)   Collection Time: 07/18/17 11:42 AM  Result Value Ref Range Status   Specimen Description ABDOMEN  Final   Special Requests Normal  Final   Gram Stain   Final    ABUNDANT WBC PRESENT, PREDOMINANTLY PMN ABUNDANT GRAM NEGATIVE RODS ABUNDANT GRAM POSITIVE COCCI MODERATE GRAM POSITIVE RODS    Culture CULTURE REINCUBATED FOR BETTER GROWTH  Final   Report Status PENDING  Incomplete  Culture, Urine     Status: Abnormal   Collection Time: 07/18/17 12:11 PM  Result Value Ref Range Status   Specimen Description URINE, RANDOM  Final   Special Requests NONE  Final   Culture MULTIPLE SPECIES PRESENT, SUGGEST RECOLLECTION (A)  Final   Report Status 07/19/2017 FINAL  Final  Culture, blood (routine x 2)     Status: None (Preliminary result)   Collection Time: 07/18/17  1:30 PM  Result Value Ref Range Status   Specimen Description BLOOD LEFT HAND  Final   Special Requests IN PEDIATRIC BOTTLE Blood Culture adequate volume  Final   Culture NO GROWTH < 24 HOURS  Final   Report Status PENDING  Incomplete  Culture, blood (routine x 2)     Status: None (Preliminary result)   Collection Time: 07/18/17  1:30 PM  Result Value Ref Range Status   Specimen Description BLOOD RIGHT HAND  Final   Special Requests IN PEDIATRIC BOTTLE Blood Culture  adequate volume  Final   Culture NO GROWTH < 24 HOURS  Final   Report Status PENDING  Incomplete  MRSA PCR Screening     Status: None   Collection Time: 07/18/17  7:10 PM  Result Value Ref Range Status   MRSA by PCR NEGATIVE NEGATIVE Final    Comment:        The GeneXpert MRSA Assay (FDA approved for NASAL specimens only), is one component of a comprehensive MRSA colonization surveillance program. It is not intended to diagnose MRSA infection nor to guide or monitor treatment for MRSA infections.     Radiology Studies: Ct Abdomen Pelvis Wo Contrast  Result Date: 07/18/2017 CLINICAL DATA:  71 year old male with recent drainage of right abdominal abscess. Follow-up study. EXAM: CT ABDOMEN AND PELVIS WITHOUT CONTRAST TECHNIQUE: Multidetector CT imaging of the abdomen and pelvis was performed following the standard protocol without IV contrast. COMPARISON:  Abdominal CT dated 07/16/2017 and 07/18/2017 FINDINGS: Evaluation of this exam is limited in the absence of intravenous contrast. Evaluation is also limited due to respiratory motion artifact as well as streak artifact caused by patient's arms. Lower chest: Partially visualized small right pleural effusion with associated mild compressive atelectasis of the right lung base. Left lung base linear atelectasis/ scarring. There is mild cardiomegaly. Multi vessel coronary vascular calcification as well as calcification of the mitral annulus. There is hypoattenuation of the cardiac blood pool suggestive of a degree of anemia. Clinical correlation is recommended. Hepatobiliary: There is mild surface nodularity of the liver concerning for early changes of cirrhosis. Clinical correlation is recommended. No intrahepatic biliary ductal dilatation. The gallbladder is distended. There is a 1.8 cm stone within the gallbladder. No pericholecystic fluid. Pancreas: Unremarkable. No pancreatic ductal dilatation or surrounding inflammatory changes. Spleen: Normal  in size without focal abnormality. Adrenals/Urinary Tract: The adrenal glands  are unremarkable. There is no hydronephrosis or nephrolithiasis on either side. The visualized ureters appear grossly unremarkable. The urinary bladder is decompressed around a Foley catheter. There is apparent diffuse thickening of the bladder wall which may be partly related to underdistention. Cystitis is not excluded. Correlation with urinalysis recommended. Stomach/Bowel: There is extensive sigmoid diverticulosis without active inflammatory changes. There is no evidence of bowel obstruction or active inflammation. The appendix is normal. 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 The abdominal aorta and IVC are otherwise grossly unremarkable on this noncontrast study. No portal venous gas identified. There is no adenopathy. Reproductive: The prostate and seminal vesicles are grossly unremarkable. Other: There has been significant interval decrease in the site of the perihepatic fluid collection compared to the prior studies. A right subcostal approach drainage catheter is seen with tip along the lateral capsule of the right lobe of the liver. Minimal amount of residual fluid and air remains. Small amount of free fluid is also seen within the pelvis. Musculoskeletal: There is diffuse subcutaneous edema and anasarca. No drainable fluid collection. Degenerative changes of the spine and median sternotomy wires. No acute osseous pathology. IMPRESSION: 1. Significant interval resolution of the previously seen perihepatic fluid collection status post drainage catheter placement. The tip of the drainage catheter is positioned along the lateral aspect of the right lobe of the liver superiorly. Very small amount of residual fluid and air remains. 2. Irregular hepatic contour likely changes of cirrhosis. 3. Extensive sigmoid diverticulosis.  No active inflammation. 4. Gallstone. Electronically Signed   By: Elgie Collard M.D.   On: 07/18/2017 20:28   Ct Head Wo Contrast  Result Date: 07/18/2017 CLINICAL DATA:  Altered level of consciousness, unexplained. EXAM: CT HEAD WITHOUT CONTRAST TECHNIQUE: Contiguous axial images were obtained from the base of the skull through the vertex without intravenous contrast. COMPARISON:  09/08/2005 FINDINGS: Brain: No acute finding by CT. Generalized brain atrophy. Chronic small-vessel ischemic changes throughout the hemispheric white matter, basal ganglia and thalami I. No mass lesion, hemorrhage, hydrocephalus or extra-axial collection. Vascular: There is atherosclerotic calcification of the major vessels at the base of the brain. Skull: Negative Sinuses/Orbits: Clear/normal Other: None IMPRESSION: No acute finding by CT. Atrophy and chronic small-vessel ischemic changes. Electronically Signed   By: Paulina Fusi M.D.   On: 07/18/2017 15:55   Dg Chest Port 1 View  Result Date: 07/18/2017 CLINICAL DATA:  Status post placement of a perihepatic drain for abscess today. EXAM: PORTABLE CHEST 1 VIEW COMPARISON:  CT abdomen and pelvis 07/16/2017 and images from drain placement today. PA and lateral chest 07/15/2017. FINDINGS: Drainage catheter is identified in the right upper quadrant of the abdomen. There is mild elevation the right hemidiaphragm relative to the left, unchanged. Lungs are clear. No pneumothorax or pleural effusion. Patient status post median sternotomy. Heart size is upper normal. IMPRESSION: No acute cardiopulmonary disease. Drainage catheter in the right upper quadrant of the abdomen. Electronically Signed   By: Drusilla Kanner M.D.   On: 07/18/2017 14:10   Ct Image Guided Drainage By Percutaneous Catheter  Result Date: 07/18/2017 INDICATION: Large right intra-abdominal abscess EXAM: CT DRAINAGE RIGHT INTRA-ABDOMINAL ABSCESS MEDICATIONS: The patient is currently admitted to the hospital and receiving intravenous antibiotics. The antibiotics were administered  within an appropriate time frame prior to the initiation of the procedure. ANESTHESIA/SEDATION: Moderate Sedation Time:  None. The patient was continuously monitored during the procedure by the interventional radiology nurse under my direct supervision. COMPLICATIONS: None immediate. PROCEDURE: Informed  written consent was obtained from the patient and his family after a thorough discussion of the procedural risks, benefits and alternatives. All questions were addressed. Maximal Sterile Barrier Technique was utilized including caps, mask, sterile gowns, sterile gloves, sterile drape, hand hygiene and skin antiseptic. A timeout was performed prior to the initiation of the procedure. Previous imaging reviewed. Patient positioned supine. Noncontrast localization CT performed to localize the right abdominal large abscess. Overlying skin marked in the right mid abdomen. Under sterile conditions and local anesthesia, an 18 gauge 10 cm access needle was advanced percutaneously into the large fluid collection. Needle position confirmed with CT. Amplatz guidewire inserted followed by tract dilatation to insert a 16 French abscess drain. Drain catheter position confirmed with CT. Syringe aspiration yielded 1100 cc purulent fluid compatible with abscess. Sample sent for Gram stain and culture. Catheter secured with a Prolene suture and connected to external gravity drainage bag. Patient tolerated the procedure well. No immediate complication. IMPRESSION: Successful CT-guided right abdominal abscess drain catheter insertion. Electronically Signed   By: Judie Petit.  Shick M.D.   On: 07/18/2017 12:52   Scheduled Meds: . allopurinol  300 mg Oral QODAY  . aspirin EC  81 mg Oral Daily  . chlorhexidine  15 mL Mouth Rinse BID  . collagenase   Topical Daily  . heparin  5,000 Units Subcutaneous Q8H  . mouth rinse  15 mL Mouth Rinse q12n4p  . metoprolol succinate  12.5 mg Oral Daily  . multivitamin with minerals  1 tablet Oral Daily  .  pantoprazole (PROTONIX) IV  40 mg Intravenous Q24H  . sodium chloride flush  5 mL Intravenous Q8H  . thiamine  100 mg Oral Daily   Or  . thiamine  100 mg Intravenous Daily   Continuous Infusions: . piperacillin-tazobactam (ZOSYN)  IV 2.25 g (07/19/17 1700)  .  sodium bicarbonate (isotonic) infusion in sterile water 50 mL/hr at 07/19/17 1115    LOS: 4 days   Merlene Laughter, DO [facility] Pager [phone]  If 7PM-7AM, please contact night-coverage [email or web address] Password [email or web address] 07/19/2017, 7:09 PM

## 2017-07-19 NOTE — Progress Notes (Signed)
S: 71 year old gentleman with hypertension, alcoholic liver cirrhosis,  HTN and CKD presented with generalized weakness, found to have had a peri hepatic abscess which was drained and worsening of renal function.  O:BP 136/65   Pulse 80   Temp 98.5 F (36.9 C) (Oral)   Resp 11   Ht 5\' 10"  (1.778 m)   Wt 156 lb 4.9 oz (70.9 kg)   SpO2 97%   BMI 22.43 kg/m   Intake/Output Summary (Last 24 hours) at 07/19/17 1424 Last data filed at 07/19/17 1349  Gross per 24 hour  Intake          3641.67 ml  Output             2150 ml  Net          1491.67 ml   Intake/Output: I/O last 3 completed shifts: In: 3860 [I.V.:3710; IV Piggyback:150] Out: 2900 [Urine:2325; Drains:575]  Intake/Output this shift:  Total I/O In: 786.7 [I.V.:336.7; IV Piggyback:450] Out: 725 [Urine:625; Drains:100] Weight change:  Gen: Well-developed gentleman, oriented just to self, open eyes with minimum interaction, in no acute distress. CVS: Regular rate and rhythm. Gr2/6 M,   Resp: Decreased breath sound at bases on anterior auscultation. Abd: Mildly tender with diffuse tenderness, bowel sounds positive. Drain RUQ Ext: 2+ lower extremity edema, clean bandage on right lower extremity.   Recent Labs Lab 07/15/17 1833 07/16/17 0423 07/17/17 0425 07/18/17 1330 07/19/17 0548  NA 133* 135 137 141 144  K 5.6* 5.1 5.5* 4.8 3.4*  CL 105 108 112* 109 103  CO2 15* 14* 12* 14* 19*  GLUCOSE 115* 75 82 84 95  BUN 93* 91* 91* 101* 98*  CREATININE 3.90* 3.83* 3.97* 4.29* 4.35*  ALBUMIN 2.2* 2.0* 1.9* 2.0* 1.6*  CALCIUM 8.9 8.4* 8.5* 8.8* 8.2*  PHOS  --   --  7.5* 6.4* 5.8*  AST 22 19  --  25 19  ALT 17 15*  --  16* 15*   Liver Function Tests:  Recent Labs Lab 07/16/17 0423 07/17/17 0425 07/18/17 1330 07/19/17 0548  AST 19  --  25 19  ALT 15*  --  16* 15*  ALKPHOS 82  --  80 69  BILITOT 0.4  --  1.5* 1.6*  PROT 5.3*  --  5.8* 4.9*  ALBUMIN 2.0* 1.9* 2.0* 1.6*    Recent Labs Lab 07/15/17 1833  LIPASE  41    Recent Labs Lab 07/18/17 1640 07/19/17 0548  AMMONIA 52* 29   CBC:  Recent Labs Lab 07/16/17 0423 07/17/17 0425  07/18/17 1330 07/19/17 0548 07/19/17 1147  WBC 22.4* 18.7*  --  13.1* 10.4 8.7  NEUTROABS  --   --   --  10.5* 7.8* 6.5  HGB 7.9* 7.2*  --  8.8* 7.4* 7.7*  HCT 22.7* 21.2*  < > 26.0* 21.4* 22.4*  MCV 91.9 92.2  --  89.3 87.3 88.2  PLT 387 363  --  368 283 249  < > = values in this interval not displayed. Cardiac Enzymes:  Recent Labs Lab 07/15/17 1833  TROPONINI <0.03   CBG: No results for input(s): GLUCAP in the last 168 hours.  Iron Studies:   Recent Labs  07/17/17 0425  IRON 28*  TIBC NOT CALCULATED  FERRITIN 934*   Studies/Results: Ct Abdomen Pelvis Wo Contrast  Result Date: 07/18/2017 CLINICAL DATA:  71 year old male with recent drainage of right abdominal abscess. Follow-up study. EXAM: CT ABDOMEN AND PELVIS WITHOUT CONTRAST TECHNIQUE: Multidetector CT imaging  of the abdomen and pelvis was performed following the standard protocol without IV contrast. COMPARISON:  Abdominal CT dated 07/16/2017 and 07/18/2017 FINDINGS: Evaluation of this exam is limited in the absence of intravenous contrast. Evaluation is also limited due to respiratory motion artifact as well as streak artifact caused by patient's arms. Lower chest: Partially visualized small right pleural effusion with associated mild compressive atelectasis of the right lung base. Left lung base linear atelectasis/ scarring. There is mild cardiomegaly. Multi vessel coronary vascular calcification as well as calcification of the mitral annulus. There is hypoattenuation of the cardiac blood pool suggestive of a degree of anemia. Clinical correlation is recommended. Hepatobiliary: There is mild surface nodularity of the liver concerning for early changes of cirrhosis. Clinical correlation is recommended. No intrahepatic biliary ductal dilatation. The gallbladder is distended. There is a 1.8 cm  stone within the gallbladder. No pericholecystic fluid. Pancreas: Unremarkable. No pancreatic ductal dilatation or surrounding inflammatory changes. Spleen: Normal in size without focal abnormality. Adrenals/Urinary Tract: The adrenal glands are unremarkable. There is no hydronephrosis or nephrolithiasis on either side. The visualized ureters appear grossly unremarkable. The urinary bladder is decompressed around a Foley catheter. There is apparent diffuse thickening of the bladder wall which may be partly related to underdistention. Cystitis is not excluded. Correlation with urinalysis recommended. Stomach/Bowel: There is extensive sigmoid diverticulosis without active inflammatory changes. There is no evidence of bowel obstruction or active inflammation. The appendix is normal. 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 The abdominal aorta and IVC are otherwise grossly unremarkable on this noncontrast study. No portal venous gas identified. There is no adenopathy. Reproductive: The prostate and seminal vesicles are grossly unremarkable. Other: There has been significant interval decrease in the site of the perihepatic fluid collection compared to the prior studies. A right subcostal approach drainage catheter is seen with tip along the lateral capsule of the right lobe of the liver. Minimal amount of residual fluid and air remains. Small amount of free fluid is also seen within the pelvis. Musculoskeletal: There is diffuse subcutaneous edema and anasarca. No drainable fluid collection. Degenerative changes of the spine and median sternotomy wires. No acute osseous pathology. IMPRESSION: 1. Significant interval resolution of the previously seen perihepatic fluid collection status post drainage catheter placement. The tip of the drainage catheter is positioned along the lateral aspect of the right lobe of the liver superiorly. Very small amount of residual fluid and air remains. 2.  Irregular hepatic contour likely changes of cirrhosis. 3. Extensive sigmoid diverticulosis.  No active inflammation. 4. Gallstone. Electronically Signed   By: Elgie Collard M.D.   On: 07/18/2017 20:28   Ct Head Wo Contrast  Result Date: 07/18/2017 CLINICAL DATA:  Altered level of consciousness, unexplained. EXAM: CT HEAD WITHOUT CONTRAST TECHNIQUE: Contiguous axial images were obtained from the base of the skull through the vertex without intravenous contrast. COMPARISON:  09/08/2005 FINDINGS: Brain: No acute finding by CT. Generalized brain atrophy. Chronic small-vessel ischemic changes throughout the hemispheric white matter, basal ganglia and thalami I. No mass lesion, hemorrhage, hydrocephalus or extra-axial collection. Vascular: There is atherosclerotic calcification of the major vessels at the base of the brain. Skull: Negative Sinuses/Orbits: Clear/normal Other: None IMPRESSION: No acute finding by CT. Atrophy and chronic small-vessel ischemic changes. Electronically Signed   By: Paulina Fusi M.D.   On: 07/18/2017 15:55   Dg Chest Port 1 View  Result Date: 07/18/2017 CLINICAL DATA:  Status post placement of a perihepatic drain for abscess today. EXAM: PORTABLE  CHEST 1 VIEW COMPARISON:  CT abdomen and pelvis 07/16/2017 and images from drain placement today. PA and lateral chest 07/15/2017. FINDINGS: Drainage catheter is identified in the right upper quadrant of the abdomen. There is mild elevation the right hemidiaphragm relative to the left, unchanged. Lungs are clear. No pneumothorax or pleural effusion. Patient status post median sternotomy. Heart size is upper normal. IMPRESSION: No acute cardiopulmonary disease. Drainage catheter in the right upper quadrant of the abdomen. Electronically Signed   By: Drusilla Kanner M.D.   On: 07/18/2017 14:10   Ct Image Guided Drainage By Percutaneous Catheter  Result Date: 07/18/2017 INDICATION: Large right intra-abdominal abscess EXAM: CT DRAINAGE  RIGHT INTRA-ABDOMINAL ABSCESS MEDICATIONS: The patient is currently admitted to the hospital and receiving intravenous antibiotics. The antibiotics were administered within an appropriate time frame prior to the initiation of the procedure. ANESTHESIA/SEDATION: Moderate Sedation Time:  None. The patient was continuously monitored during the procedure by the interventional radiology nurse under my direct supervision. COMPLICATIONS: None immediate. PROCEDURE: Informed written consent was obtained from the patient and his family after a thorough discussion of the procedural risks, benefits and alternatives. All questions were addressed. Maximal Sterile Barrier Technique was utilized including caps, mask, sterile gowns, sterile gloves, sterile drape, hand hygiene and skin antiseptic. A timeout was performed prior to the initiation of the procedure. Previous imaging reviewed. Patient positioned supine. Noncontrast localization CT performed to localize the right abdominal large abscess. Overlying skin marked in the right mid abdomen. Under sterile conditions and local anesthesia, an 18 gauge 10 cm access needle was advanced percutaneously into the large fluid collection. Needle position confirmed with CT. Amplatz guidewire inserted followed by tract dilatation to insert a 16 French abscess drain. Drain catheter position confirmed with CT. Syringe aspiration yielded 1100 cc purulent fluid compatible with abscess. Sample sent for Gram stain and culture. Catheter secured with a Prolene suture and connected to external gravity drainage bag. Patient tolerated the procedure well. No immediate complication. IMPRESSION: Successful CT-guided right abdominal abscess drain catheter insertion. Electronically Signed   By: Judie Petit.  Shick M.D.   On: 07/18/2017 12:52   . allopurinol  300 mg Oral QODAY  . aspirin EC  81 mg Oral Daily  . chlorhexidine  15 mL Mouth Rinse BID  . collagenase   Topical Daily  . heparin  5,000 Units  Subcutaneous Q8H  . mouth rinse  15 mL Mouth Rinse q12n4p  . metoprolol succinate  12.5 mg Oral Daily  . multivitamin with minerals  1 tablet Oral Daily  . pantoprazole (PROTONIX) IV  40 mg Intravenous Q24H  . sodium chloride flush  5 mL Intravenous Q8H  . thiamine  100 mg Oral Daily   Or  . thiamine  100 mg Intravenous Daily    BMET    Component Value Date/Time   NA 144 07/19/2017 0548   K 3.4 (L) 07/19/2017 0548   CL 103 07/19/2017 0548   CO2 19 (L) 07/19/2017 0548   GLUCOSE 95 07/19/2017 0548   BUN 98 (H) 07/19/2017 0548   CREATININE 4.35 (H) 07/19/2017 0548   CALCIUM 8.2 (L) 07/19/2017 0548   GFRNONAA 12 (L) 07/19/2017 0548   GFRAA 14 (L) 07/19/2017 0548   CBC    Component Value Date/Time   WBC 8.7 07/19/2017 1147   RBC 2.54 (L) 07/19/2017 1147   HGB 7.7 (L) 07/19/2017 1147   HCT 22.4 (L) 07/19/2017 1147   PLT 249 07/19/2017 1147   MCV 88.2 07/19/2017 1147  MCH 30.3 07/19/2017 1147   MCHC 34.4 07/19/2017 1147   RDW 12.8 07/19/2017 1147   LYMPHSABS 1.0 07/19/2017 1147   MONOABS 1.3 (H) 07/19/2017 1147   EOSABS 0.0 07/19/2017 1147   BASOSABS 0.0 07/19/2017 1147     Assessment/Plan:  1. AKI.  Renal function stable with Mod elevated creatinine at 3.53 today. Hyperkalemia and his symptoms have resolved with  Bicarbonate infusion. He has intrinsic glomerular disease, most likely. Infectious nephritis, polyarteritis can be a possibility. We anticipate improvement in his renal function with resolution of his abdominal infection. - Decreased the rate of infusion of bicarbonate to 50 mL per hour. -Continue holding Lasix. -Continue antibiotics. -Continue monitoring renal function. Appears postinfectious or periinfectious Glomerlonephritis.  Tx of abscess primary issue. If Cr stays up will bx in a few weeks  Abdominal abscess. Perihepatic abscess fluid is growing multiple organisms, most likely gut flora. Definitive organism identification and culture results are  pending. Surgery is following, patient is not a suitable candidate for surgery at this time. -Continue antibiotics.  Appears gut leak of some kind  Altered mental status. Little improved as compared to yesterday, patient remained oriented to self, open eyes on command with minimum interaction. CT head, hemolysis labs are within normal limit. Total bilirubin mildly elevated at 1.6 today with normal renal function.  Anemia. Hemoglobin decreased to 7.4 today. No other sign of active bleeding. Patient do have hematuria. -Continue monitoring and transfuse as needed. When stable, iv Fe  Hypertension. Currently normotensive. Keep monitoring.  Alcohol abuse. Continue CIWA protocol.  I have seen and examined this patient and agree with the plan of care seen, eval, examined,discussed with primary MD and resident. .  Arpi Diebold L 07/19/2017, 6:17 PM

## 2017-07-19 NOTE — Progress Notes (Signed)
PT Cancellation Note  Patient Details Name: Jonathan RiderWayne A Pierrelouis MRN: [id number] DOB: 05/12/1946   Cancelled Treatment:    Reason Eval/Treat Not Completed: Other (comment). [facility] Pt lethargic and fatigued. Per nursing tomorrow likely be better time. Will see tomorrow.   Angelina OkCary W Maycok 07/19/2017, 3:45 PM  [employer] [facility] PT [phone]

## 2017-07-20 DIAGNOSIS — R4182 Altered mental status, unspecified: Secondary | ICD-10-CM

## 2017-07-20 DIAGNOSIS — E870 Hyperosmolality and hypernatremia: Secondary | ICD-10-CM

## 2017-07-20 DIAGNOSIS — E876 Hypokalemia: Secondary | ICD-10-CM

## 2017-07-20 LAB — CBC WITH DIFFERENTIAL/PLATELET
BASOS ABS: 0 10*3/uL (ref 0.0–0.1)
Basophils Relative: 0 %
EOS PCT: 0 %
Eosinophils Absolute: 0 10*3/uL (ref 0.0–0.7)
HEMATOCRIT: 21.4 % — AB (ref 39.0–52.0)
HEMOGLOBIN: 7.2 g/dL — AB (ref 13.0–17.0)
LYMPHS PCT: 13 %
Lymphs Abs: 1 10*3/uL (ref 0.7–4.0)
MCH: 30.1 pg (ref 26.0–34.0)
MCHC: 33.6 g/dL (ref 30.0–36.0)
MCV: 89.5 fL (ref 78.0–100.0)
Monocytes Absolute: 1 10*3/uL (ref 0.1–1.0)
Monocytes Relative: 12 %
NEUTROS ABS: 6 10*3/uL (ref 1.7–7.7)
NEUTROS PCT: 75 %
PLATELETS: 250 10*3/uL (ref 150–400)
RBC: 2.39 MIL/uL — AB (ref 4.22–5.81)
RDW: 12.7 % (ref 11.5–15.5)
WBC: 8 10*3/uL (ref 4.0–10.5)

## 2017-07-20 LAB — COMPREHENSIVE METABOLIC PANEL
ALT: 13 U/L — ABNORMAL LOW (ref 17–63)
ANION GAP: 19 — AB (ref 5–15)
AST: 17 U/L (ref 15–41)
Albumin: 1.4 g/dL — ABNORMAL LOW (ref 3.5–5.0)
Alkaline Phosphatase: 59 U/L (ref 38–126)
BUN: 90 mg/dL — AB (ref 6–20)
CHLORIDE: 104 mmol/L (ref 101–111)
CO2: 24 mmol/L (ref 22–32)
Calcium: 8.3 mg/dL — ABNORMAL LOW (ref 8.9–10.3)
Creatinine, Ser: 4.25 mg/dL — ABNORMAL HIGH (ref 0.61–1.24)
GFR calc Af Amer: 15 mL/min — ABNORMAL LOW (ref >60)
GFR, EST NON AFRICAN AMERICAN: 13 mL/min — AB (ref >60)
Glucose, Bld: 97 mg/dL (ref 65–99)
POTASSIUM: 2.9 mmol/L — AB (ref 3.5–5.1)
Sodium: 147 mmol/L — ABNORMAL HIGH (ref 135–145)
Total Bilirubin: 1.4 mg/dL — ABNORMAL HIGH (ref 0.3–1.2)
Total Protein: 4.9 g/dL — ABNORMAL LOW (ref 6.5–8.1)

## 2017-07-20 LAB — URINE CULTURE: CULTURE: NO GROWTH

## 2017-07-20 LAB — BASIC METABOLIC PANEL
ANION GAP: 16 — AB (ref 5–15)
BUN: 83 mg/dL — ABNORMAL HIGH (ref 6–20)
CALCIUM: 8.3 mg/dL — AB (ref 8.9–10.3)
CO2: 25 mmol/L (ref 22–32)
Chloride: 104 mmol/L (ref 101–111)
Creatinine, Ser: 4.04 mg/dL — ABNORMAL HIGH (ref 0.61–1.24)
GFR, EST AFRICAN AMERICAN: 16 mL/min — AB (ref >60)
GFR, EST NON AFRICAN AMERICAN: 14 mL/min — AB (ref >60)
Glucose, Bld: 94 mg/dL (ref 65–99)
POTASSIUM: 3.2 mmol/L — AB (ref 3.5–5.1)
SODIUM: 145 mmol/L (ref 135–145)

## 2017-07-20 LAB — HEMOGLOBIN AND HEMATOCRIT, BLOOD
HCT: 27.2 % — ABNORMAL LOW (ref 39.0–52.0)
Hemoglobin: 9.1 g/dL — ABNORMAL LOW (ref 13.0–17.0)

## 2017-07-20 LAB — HIV ANTIBODY (ROUTINE TESTING W REFLEX): HIV SCREEN 4TH GENERATION: NONREACTIVE

## 2017-07-20 LAB — PHOSPHORUS: PHOSPHORUS: 5.3 mg/dL — AB (ref 2.5–4.6)

## 2017-07-20 LAB — GLUCOSE, CAPILLARY: Glucose-Capillary: 102 mg/dL — ABNORMAL HIGH (ref 65–99)

## 2017-07-20 LAB — RPR: RPR Ser Ql: NONREACTIVE

## 2017-07-20 LAB — MAGNESIUM: MAGNESIUM: 1.6 mg/dL — AB (ref 1.7–2.4)

## 2017-07-20 LAB — PREPARE RBC (CROSSMATCH)

## 2017-07-20 LAB — ABO/RH: ABO/RH(D): O POS

## 2017-07-20 MED ORDER — POTASSIUM CHLORIDE 10 MEQ/100ML IV SOLN
10.0000 meq | INTRAVENOUS | Status: AC
  Administered 2017-07-20 (×4): 10 meq via INTRAVENOUS
  Filled 2017-07-20: qty 100

## 2017-07-20 MED ORDER — MAGNESIUM SULFATE IN D5W 1-5 GM/100ML-% IV SOLN
1.0000 g | Freq: Once | INTRAVENOUS | Status: AC
Start: 2017-07-20 — End: 2017-07-20
  Administered 2017-07-20: 1 g via INTRAVENOUS
  Filled 2017-07-20: qty 100

## 2017-07-20 MED ORDER — POTASSIUM CHLORIDE 10 MEQ/100ML IV SOLN
INTRAVENOUS | Status: AC
Start: 2017-07-20 — End: 2017-07-20
  Administered 2017-07-20: 10 meq
  Filled 2017-07-20: qty 100

## 2017-07-20 MED ORDER — SODIUM CHLORIDE 0.9 % IV SOLN
Freq: Once | INTRAVENOUS | Status: AC
  Administered 2017-07-20: 10:00:00 via INTRAVENOUS

## 2017-07-20 NOTE — Progress Notes (Signed)
Subjective/Chief Complaint: Confused, wants water   Objective: Vital signs in last 24 hours: Temp:  [98 F (36.7 C)-98.9 F (37.2 C)] 98.9 F (37.2 C) (10/26 0717) Pulse Rate:  [76-105] 91 (10/26 0717) Resp:  [9-18] 11 (10/26 0717) BP: (118-163)/(58-94) 155/67 (10/26 0717) SpO2:  [95 %-100 %] 98 % (10/26 0717) Last BM Date: 07/18/17  Intake/Output from previous day: 10/25 0701 - 10/26 0700 In: 1695.8 [I.V.:1095.8; IV Piggyback:600] Out: 1725 [Urine:1425; Drains:300] Intake/Output this shift: Total I/O In: -  Out: 250 [Urine:250]  General appearance: no distress Resp: clear to auscultation bilaterally Cardio: reg with ectopy GI: reg with ectopy  GI correction soft, mild TTP epig, drain with green pus   Lab Results:   Recent Labs  07/19/17 1147 07/20/17 0400  WBC 8.7 8.0  HGB 7.7* 7.2*  HCT 22.4* 21.4*  PLT 249 250   BMET  Recent Labs  07/19/17 0548 07/20/17 0400  NA 144 147*  K 3.4* 2.9*  CL 103 104  CO2 19* 24  GLUCOSE 95 97  BUN 98* 90*  CREATININE 4.35* 4.25*  CALCIUM 8.2* 8.3*   PT/INR  Recent Labs  07/17/17 1041 07/18/17 1640  LABPROT 15.2 15.2  INR 1.21 1.21   ABG No results for input(s): PHART, HCO3 in the last 72 hours.  Invalid input(s): PCO2, PO2  Studies/Results: Ct Abdomen Pelvis Wo Contrast  Result Date: 07/18/2017 CLINICAL DATA:  71 year old male with recent drainage of right abdominal abscess. Follow-up study. EXAM: CT ABDOMEN AND PELVIS WITHOUT CONTRAST TECHNIQUE: Multidetector CT imaging of the abdomen and pelvis was performed following the standard protocol without IV contrast. COMPARISON:  Abdominal CT dated 07/16/2017 and 07/18/2017 FINDINGS: Evaluation of this exam is limited in the absence of intravenous contrast. Evaluation is also limited due to respiratory motion artifact as well as streak artifact caused by patient's arms. Lower chest: Partially visualized small right pleural effusion with associated mild  compressive atelectasis of the right lung base. Left lung base linear atelectasis/ scarring. There is mild cardiomegaly. Multi vessel coronary vascular calcification as well as calcification of the mitral annulus. There is hypoattenuation of the cardiac blood pool suggestive of a degree of anemia. Clinical correlation is recommended. Hepatobiliary: There is mild surface nodularity of the liver concerning for early changes of cirrhosis. Clinical correlation is recommended. No intrahepatic biliary ductal dilatation. The gallbladder is distended. There is a 1.8 cm stone within the gallbladder. No pericholecystic fluid. Pancreas: Unremarkable. No pancreatic ductal dilatation or surrounding inflammatory changes. Spleen: Normal in size without focal abnormality. Adrenals/Urinary Tract: The adrenal glands are unremarkable. There is no hydronephrosis or nephrolithiasis on either side. The visualized ureters appear grossly unremarkable. The urinary bladder is decompressed around a Foley catheter. There is apparent diffuse thickening of the bladder wall which may be partly related to underdistention. Cystitis is not excluded. Correlation with urinalysis recommended. Stomach/Bowel: There is extensive sigmoid diverticulosis without active inflammatory changes. There is no evidence of bowel obstruction or active inflammation. The appendix is normal. 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 The abdominal aorta and IVC are otherwise grossly unremarkable on this noncontrast study. No portal venous gas identified. There is no adenopathy. Reproductive: The prostate and seminal vesicles are grossly unremarkable. Other: There has been significant interval decrease in the site of the perihepatic fluid collection compared to the prior studies. A right subcostal approach drainage catheter is seen with tip along the lateral capsule of the right lobe of the liver. Minimal amount of residual fluid and  air  remains. Small amount of free fluid is also seen within the pelvis. Musculoskeletal: There is diffuse subcutaneous edema and anasarca. No drainable fluid collection. Degenerative changes of the spine and median sternotomy wires. No acute osseous pathology. IMPRESSION: 1. Significant interval resolution of the previously seen perihepatic fluid collection status post drainage catheter placement. The tip of the drainage catheter is positioned along the lateral aspect of the right lobe of the liver superiorly. Very small amount of residual fluid and air remains. 2. Irregular hepatic contour likely changes of cirrhosis. 3. Extensive sigmoid diverticulosis.  No active inflammation. 4. Gallstone. Electronically Signed   By: Elgie Collard M.D.   On: 07/18/2017 20:28   Ct Head Wo Contrast  Result Date: 07/18/2017 CLINICAL DATA:  Altered level of consciousness, unexplained. EXAM: CT HEAD WITHOUT CONTRAST TECHNIQUE: Contiguous axial images were obtained from the base of the skull through the vertex without intravenous contrast. COMPARISON:  09/08/2005 FINDINGS: Brain: No acute finding by CT. Generalized brain atrophy. Chronic small-vessel ischemic changes throughout the hemispheric white matter, basal ganglia and thalami I. No mass lesion, hemorrhage, hydrocephalus or extra-axial collection. Vascular: There is atherosclerotic calcification of the major vessels at the base of the brain. Skull: Negative Sinuses/Orbits: Clear/normal Other: None IMPRESSION: No acute finding by CT. Atrophy and chronic small-vessel ischemic changes. Electronically Signed   By: Paulina Fusi M.D.   On: 07/18/2017 15:55   Dg Chest Port 1 View  Result Date: 07/18/2017 CLINICAL DATA:  Status post placement of a perihepatic drain for abscess today. EXAM: PORTABLE CHEST 1 VIEW COMPARISON:  CT abdomen and pelvis 07/16/2017 and images from drain placement today. PA and lateral chest 07/15/2017. FINDINGS: Drainage catheter is identified in the  right upper quadrant of the abdomen. There is mild elevation the right hemidiaphragm relative to the left, unchanged. Lungs are clear. No pneumothorax or pleural effusion. Patient status post median sternotomy. Heart size is upper normal. IMPRESSION: No acute cardiopulmonary disease. Drainage catheter in the right upper quadrant of the abdomen. Electronically Signed   By: Drusilla Kanner M.D.   On: 07/18/2017 14:10   Ct Image Guided Drainage By Percutaneous Catheter  Result Date: 07/18/2017 INDICATION: Large right intra-abdominal abscess EXAM: CT DRAINAGE RIGHT INTRA-ABDOMINAL ABSCESS MEDICATIONS: The patient is currently admitted to the hospital and receiving intravenous antibiotics. The antibiotics were administered within an appropriate time frame prior to the initiation of the procedure. ANESTHESIA/SEDATION: Moderate Sedation Time:  None. The patient was continuously monitored during the procedure by the interventional radiology nurse under my direct supervision. COMPLICATIONS: None immediate. PROCEDURE: Informed written consent was obtained from the patient and his family after a thorough discussion of the procedural risks, benefits and alternatives. All questions were addressed. Maximal Sterile Barrier Technique was utilized including caps, mask, sterile gowns, sterile gloves, sterile drape, hand hygiene and skin antiseptic. A timeout was performed prior to the initiation of the procedure. Previous imaging reviewed. Patient positioned supine. Noncontrast localization CT performed to localize the right abdominal large abscess. Overlying skin marked in the right mid abdomen. Under sterile conditions and local anesthesia, an 18 gauge 10 cm access needle was advanced percutaneously into the large fluid collection. Needle position confirmed with CT. Amplatz guidewire inserted followed by tract dilatation to insert a 16 French abscess drain. Drain catheter position confirmed with CT. Syringe aspiration yielded  1100 cc purulent fluid compatible with abscess. Sample sent for Gram stain and culture. Catheter secured with a Prolene suture and connected to external gravity drainage bag. Patient  tolerated the procedure well. No immediate complication. IMPRESSION: Successful CT-guided right abdominal abscess drain catheter insertion. Electronically Signed   By: Judie PetitM.  Shick M.D.   On: 07/18/2017 12:52    Anti-infectives: Anti-infectives    Start     Dose/Rate Route Frequency Ordered Stop   07/18/17 1300  piperacillin-tazobactam (ZOSYN) IVPB 2.25 g     2.25 g 100 mL/hr over 30 Minutes Intravenous Every 6 hours 07/18/17 1245        Assessment/Plan: Active Problems:   AKI (acute kidney injury) (HCC)   Polyarteritis nodosa (HCC)   Normocytic anemia  Intraabdominal abscess adjacent to the liver - drained by IR with >1L of purulent drainage, culture pending - possible infected ascites given suggestion of cirrhosis vs SB perf - no active inflammation of stomach or bowel to suggest perforation - recommend IV abx, IVF, ice chips - no acute surgical intervention - he would be very high risk with his cirrhosis  I spoke with his wife and daughter as well  LOS: 5 days    Domnic Vantol E 07/20/2017

## 2017-07-20 NOTE — Progress Notes (Signed)
PROGRESS NOTE    Jonathan Walker  ZOX:[id number] DOB: [birth year] DOA: 07/15/2017 PCP: Samuel Jester, DO   Brief Narrative:  Patient is a 71 year old CAD s/p CABG, hypertension, hyperlipidemia, gout who resents with weakness and was found to have AKI.  His lasix was increased from every other day to daily dosing about 1 month ago.  For the last few days, however, he has not made very much urine output and he has become very fatigued.  Additionally, he was seen by dermatology, Dr. Margo Aye this month for some new right lower extremity ulcers.  They were biopsied and biopsy demonstrated no evidence of vasculitis, but sample was superficial and nonspecific.  He was diagnosed with polyarteritis nodosum and given keflex and prednisone taper which have helped his ulcers.  He had a weakly positive ANA and CANCA, both titers were 1:80 earlier this month. Creatinine has worsened and patient was transferred to [facility] and had a drain placed for an intra-abdominal abscess. Patient was more alert and interactive today.  Assessment & Plan:   Active Problems:   AKI (acute kidney injury) (HCC)   Polyarteritis nodosa (HCC)   Normocytic anemia   Intra-abdominal abscess (HCC)   Acute encephalopathy   CAD (coronary artery disease)   Gout   Essential hypertension   Ulcers of both lower legs (HCC)   Hyperammonemia (HCC)   History of alcoholism (HCC)   Hypernatremia   Hypokalemia   Hypomagnesemia   Hyperphosphatemia  Acute kidney injury with microscopic Hematuria/Evidence of possible nephritis.   -Baseline creatinine is 0.9 on May 8th, 2018.  On and at that time his lasix dose was increased.   -Creatinine 1.38 on Oct 1st.  Possible this may all be due to ATN from dehydration/overdiuresis, but autoimmune disorder also possible. Cr has worsened in the setting of Abdominal Infection/Abscess; Slowly improving and now Cr is 4.25 -Nephrology consultation appreciated -ANA has been repeated -Mpo/pr-3 ANCA Ab WNL    -Complement:  C3 70 (Low), CH 50 and C4 wnl -HCV negative and HBV pending -HIV Non-Reactive -UPEP being collected -Bicarb gtt 50 mL/hr by Nephro now stopped by Nephro -Started Lasix for hyperkalemia but held by Nephrology  -Patiromer stopped Nephrology -Avoid Kayexelate in setting of occult positive stool and unusual abdominal fluid collection -Renal US:  Minimally complex bilateral renal cysts, unchanged from prior -May need renal biopsy but will hold off for now and if Cr still stays up will biopsy in a few weeks. 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  -C/w IV Zosyn  -Nephro now repeating BMP today and if Na+ is still elevated patient to be started on D5W 1/2 NS IVF  Hypernatremia -Mild at 147 -Bicarbonate gtt now stopped -Repeat BMP pending today -If Na+ is still elevated patient to be started on D5W 1/2 NS IVF -Repeat CMP in AM  Hypokalemia -Patient's K+ was 2.9 this AM -Replete with IV KCl 40 meQ -Continue to Monitor and Replete as Necessary -Repeat CMP in AM  Hypomagnesemia -Patient's Mag Level 1.6 -Replete with IV Mag Sulfate 1 gram -Continue to Monitor and Replete as Necessary -Repeat Mag Level in AM  Hyperphosphatemia -Patient's Phos Level was 5.8 and went to 5.3 -Likely to improve if Kidney Fxn starts improving -Repeat Phos Level in AM  Intra-abdominal Abscess s/p Drain by IR -May be infected ascites given suggestion of cirrhosis versus blood versus contained bowel perforation; Suspect likely from bowel or enteric source -CT scan: Large fluid collection  around the liver extending into the right lower quadrant with air fluid level -Transferred to [facility] for IR paracentesis and drain placement -IR drained 1.1 Liter of purulent discharge and drainage tube showed cloudy grey purulent discharge  -IR sending Fluid for Bilirubin and if elevated will proceed with HIDA Scan to evaluate for  Bile Leak; However unable to obtain Bilrubin Test on Abscess fluid per Lab; Per IR continue to Monitor Drain Output over the next few days and consider drain injection early next week  -Ordered paracentesis labs with cytology and a hematocrit (in case it's bloody) -Patient was Pan-Cultured (Blood, Urine,CXR) and started on Empiric Zosyn -Repeat Urine Cx 07/19/17 showed No Growth  -Abscess Gram Stain showed Abundant WBC's, Abundant GNR, Abundant Gram + Cocci and Gram + Rods and pending Cx (Reincubated for better Growth and holding for possible Anaerobe) -Blood Cx show NGTD at 2 Days  -Abdominal CT Scan repeated ordered and showed significant interval resolution of perihepatic fluid collection s/p catheter drainage and very small amount of residual fluid and air remains -C/w IV Zosyn with Pharmacy to Dose  Acute Encephalopathy, improved -Received 1 mg of Ativan 10/23 for CIWA but doubt his Encephalopathy is explained by that -Transferred to SDU for Closer Evaluation  -Ordered Head CT Scan w/o Contrast and showed no Acute Finding by CT but showed atrophy and chronic small-vessel ischemich cangeds -C/w Bicarb gtt (150 mEQ) at 50 mL/hr (decreased by Nephro) -Checked Ammonia Level and decreased from 52 -> 29 -RPR was Non-Reactive  -If not improving will order MRI and EEG and discuss with Neurology   Coronary Artery Disease -Stable -Continue Aspirin and Beta Blocker -Not on Statin  Gout -Stable -Continue allopurinol  Hypertension -Blood pressure low normal -Hold parameter for metoprolol and continue at 12.5 mg po Daily   Leg ulcers,  -possible polyarteritis nodosa, improved with antibiotics and steroids -Good pedal pulse -ABI wnl -Appreciate wound care:  Santyl with dry dressings daily  Leukocytosis likely from Intra-abdominal Abscess -Less Likely from Steroids] -Started Patient on Emprici Zosyn -Pan-Culture and repeat Abdominal CT Scan  -WBC went from 22.4 -> 18.7 -> 13.1 ->  8.7 -> 8.0 -Repeat CBC in AM  Anemia of inflammation and possible renal disease and slow GI losses.  -Hemoglobin continued to decline which may be hemodilutional but also consider blood loss around liver? -Iron studies suggest inflammation, B12 HIGH, folate wnl, SPEP pending -TSH 1.76 -Occult stool POSITIVE -C/w Ferrous Sulfate 325 mg po BID - Nephrology ordering Hemolysis Labs -Hb/Hct went from 7.2/21.2 -> 8.8/26.0 -> 7.4/21.4 -> 7.7/22.4 -> 7.2/21.4 -Will give 1 unit of pRBC's  -Continue to Monitor and Repeat CBC in AM   Hx of EtOH Abuse/Concern for Withdrawal -Patient's family states he drinks heavily -Placed on CIWA and given 1 mg of Ativan last night   Hyperammonemia, improved -Patient's Ammonia Level 52 and improved to 29 -Repeat Ammonia Level in AM and await Repeat CT Scan findings prior to administration of Lactulose   Liver Cirrhosis -Continue to Monitor LFT's; AST is 17 and ALT is 13 -Repeat CMP in AM   DVT prophylaxis: Heparin 5,000 sq  q8h Code Status: DO NOT RESUSCITATE  Family Communication: Discussed with Wife and Daughter at beside Disposition Plan: Remain in SDU; SNF when medically stable   Consultants:   Nephrology  General Surgery  Interventional Radiology   Procedures:  S/p CT DRAIN RT ABDOMEN   Antimicrobials: Anti-infectives    Start     Dose/Rate Route Frequency Ordered Stop  07/18/17 1300  piperacillin-tazobactam (ZOSYN) IVPB 2.25 g     2.25 g 100 mL/hr over 30 Minutes Intravenous Every 6 hours 07/18/17 1245       Subjective: Seen and examined and he was much improved and alert. Had no complaints except that his abdomen hurt when you pressed on it. No CP or SOB. No other complaints at this time.   Objective: Vitals:   07/20/17 1315 07/20/17 1400 07/20/17 1500 07/20/17 1545  BP: (!) 146/71 (!) 145/71 (!) 143/70 (!) 151/78  Pulse:  77 75 75  Resp:  13 10 (!) 9  Temp: 97.7 F (36.5 C)   97.8 F (36.6 C)  TempSrc: Oral   Oral    SpO2:  98% 97% 98%  Weight:      Height:        Intake/Output Summary (Last 24 hours) at 07/20/17 1644 Last data filed at 07/20/17 1545  Gross per 24 hour  Intake          1290.83 ml  Output             1375 ml  Net           -84.17 ml   Filed Weights   07/15/17 1707 07/15/17 2300  Weight: 71.7 kg (158 lb) 70.9 kg (156 lb 4.9 oz)   Examination: Physical Exam:  Constitutional: WN/WD Caucasian male who is calm and in NAD. More alert today.  Eyes: Sclerae anicteric. Lids appear normal. ENMT: External ears and nose appear normal Neck: Supple with no JVD Respiratory: Diminished to auscultation but no appreciable wheezing/rales/rhonchi. Cardiovascular: RRR; 2/6 Systolic Murmur. 1-2+ LE Edema Abdomen: Soft. Mildly tender to palpate. ND. Bowel sounds present; Has abdominal drain with some leakage and drain in place with foul smelling green purulence GU: Deferred. Has no foley in  Musculoskeletal: No contractures. No cyanosis Skin: Warm and Dry. Has bilateral Leg Ulcerations that are covered. Has abdominal Drain in place with green purulence that is foul smelling Neurologic: CN 2-12 grossly intact. No appreciable focal deficits Psychiatric: Pleasant mood and affect. Awake and oriented x2  Data Reviewed: I have personally reviewed following labs and imaging studies  CBC:  Recent Labs Lab 07/15/17 1833  07/17/17 0425 07/17/17 1041 07/18/17 1330 07/19/17 0548 07/19/17 1147 07/20/17 0400  WBC 26.2*  < > 18.7*  --  13.1* 10.4 8.7 8.0  NEUTROABS 23.7*  --   --   --  10.5* 7.8* 6.5 6.0  HGB 8.4*  < > 7.2*  --  8.8* 7.4* 7.7* 7.2*  HCT 24.4*  < > 21.2* 21.2* 26.0* 21.4* 22.4* 21.4*  MCV 91.4  < > 92.2  --  89.3 87.3 88.2 89.5  PLT 415*  < > 363  --  368 283 249 250  < > = values in this interval not displayed. Basic Metabolic Panel:  Recent Labs Lab 07/16/17 0423 07/17/17 0425 07/18/17 1330 07/19/17 0548 07/20/17 0400  NA 135 137 141 144 147*  K 5.1 5.5* 4.8 3.4* 2.9*   CL 108 112* 109 103 104  CO2 14* 12* 14* 19* 24  GLUCOSE 75 82 84 95 97  BUN 91* 91* 101* 98* 90*  CREATININE 3.83* 3.97* 4.29* 4.35* 4.25*  CALCIUM 8.4* 8.5* 8.8* 8.2* 8.3*  MG  --   --  2.0 1.7 1.6*  PHOS  --  7.5* 6.4* 5.8* 5.3*   GFR: Estimated Creatinine Clearance: 16 mL/min (A) (by C-G formula based on SCr of 4.25 mg/dL (H)). Liver  Function Tests:  Recent Labs Lab 07/15/17 1833 07/16/17 0423 07/17/17 0425 07/18/17 1330 07/19/17 0548 07/20/17 0400  AST 22 19  --  25 19 17   ALT 17 15*  --  16* 15* 13*  ALKPHOS 90 82  --  80 69 59  BILITOT 0.5 0.4  --  1.5* 1.6* 1.4*  PROT 5.8* 5.3*  --  5.8* 4.9* 4.9*  ALBUMIN 2.2* 2.0* 1.9* 2.0* 1.6* 1.4*    Recent Labs Lab 07/15/17 1833  LIPASE 41    Recent Labs Lab 07/18/17 1640 07/19/17 0548  AMMONIA 52* 29   Coagulation Profile:  Recent Labs Lab 07/17/17 1041 07/18/17 1640  INR 1.21 1.21   Cardiac Enzymes:  Recent Labs Lab 07/15/17 1833  TROPONINI <0.03   BNP (last 3 results) No results for input(s): PROBNP in the last 8760 hours. HbA1C: No results for input(s): HGBA1C in the last 72 hours. CBG:  Recent Labs Lab 07/20/17 1135  GLUCAP 102*   Lipid Profile: No results for input(s): CHOL, HDL, LDLCALC, TRIG, CHOLHDL, LDLDIRECT in the last 72 hours. Thyroid Function Tests: No results for input(s): TSH, T4TOTAL, FREET4, T3FREE, THYROIDAB in the last 72 hours. Anemia Panel: No results for input(s): VITAMINB12, FOLATE, FERRITIN, TIBC, IRON, RETICCTPCT in the last 72 hours. Sepsis Labs: No results for input(s): PROCALCITON, LATICACIDVEN in the last 168 hours.  Recent Results (from the past 240 hour(s))  Aerobic/Anaerobic Culture (surgical/deep wound)     Status: None (Preliminary result)   Collection Time: 07/18/17 11:42 AM  Result Value Ref Range Status   Specimen Description ABDOMEN  Final   Special Requests Normal  Final   Gram Stain   Final    ABUNDANT WBC PRESENT, PREDOMINANTLY PMN ABUNDANT  GRAM NEGATIVE RODS ABUNDANT GRAM POSITIVE COCCI MODERATE GRAM POSITIVE RODS    Culture   Final    CULTURE REINCUBATED FOR BETTER GROWTH HOLDING FOR POSSIBLE ANAEROBE    Report Status PENDING  Incomplete  Culture, Urine     Status: Abnormal   Collection Time: 07/18/17 12:11 PM  Result Value Ref Range Status   Specimen Description URINE, RANDOM  Final   Special Requests NONE  Final   Culture MULTIPLE SPECIES PRESENT, SUGGEST RECOLLECTION (A)  Final   Report Status 07/19/2017 FINAL  Final  Culture, blood (routine x 2)     Status: None (Preliminary result)   Collection Time: 07/18/17  1:30 PM  Result Value Ref Range Status   Specimen Description BLOOD LEFT HAND  Final   Special Requests IN PEDIATRIC BOTTLE Blood Culture adequate volume  Final   Culture NO GROWTH 2 DAYS  Final   Report Status PENDING  Incomplete  Culture, blood (routine x 2)     Status: None (Preliminary result)   Collection Time: 07/18/17  1:30 PM  Result Value Ref Range Status   Specimen Description BLOOD RIGHT HAND  Final   Special Requests IN PEDIATRIC BOTTLE Blood Culture adequate volume  Final   Culture NO GROWTH 2 DAYS  Final   Report Status PENDING  Incomplete  MRSA PCR Screening     Status: None   Collection Time: 07/18/17  7:10 PM  Result Value Ref Range Status   MRSA by PCR NEGATIVE NEGATIVE Final    Comment:        The GeneXpert MRSA Assay (FDA approved for NASAL specimens only), is one component of a comprehensive MRSA colonization surveillance program. It is not intended to diagnose MRSA infection nor to guide or monitor  treatment for MRSA infections.   Culture, Urine     Status: None   Collection Time: 07/19/17  1:20 PM  Result Value Ref Range Status   Specimen Description URINE, CATHETERIZED  Final   Special Requests NONE  Final   Culture NO GROWTH  Final   Report Status 07/20/2017 FINAL  Final    Radiology Studies: Ct Abdomen Pelvis Wo Contrast  Result Date: 07/18/2017 CLINICAL  DATA:  71 year old male with recent drainage of right abdominal abscess. Follow-up study. EXAM: CT ABDOMEN AND PELVIS WITHOUT CONTRAST TECHNIQUE: Multidetector CT imaging of the abdomen and pelvis was performed following the standard protocol without IV contrast. COMPARISON:  Abdominal CT dated 07/16/2017 and 07/18/2017 FINDINGS: Evaluation of this exam is limited in the absence of intravenous contrast. Evaluation is also limited due to respiratory motion artifact as well as streak artifact caused by patient's arms. Lower chest: Partially visualized small right pleural effusion with associated mild compressive atelectasis of the right lung base. Left lung base linear atelectasis/ scarring. There is mild cardiomegaly. Multi vessel coronary vascular calcification as well as calcification of the mitral annulus. There is hypoattenuation of the cardiac blood pool suggestive of a degree of anemia. Clinical correlation is recommended. Hepatobiliary: There is mild surface nodularity of the liver concerning for early changes of cirrhosis. Clinical correlation is recommended. No intrahepatic biliary ductal dilatation. The gallbladder is distended. There is a 1.8 cm stone within the gallbladder. No pericholecystic fluid. Pancreas: Unremarkable. No pancreatic ductal dilatation or surrounding inflammatory changes. Spleen: Normal in size without focal abnormality. Adrenals/Urinary Tract: The adrenal glands are unremarkable. There is no hydronephrosis or nephrolithiasis on either side. The visualized ureters appear grossly unremarkable. The urinary bladder is decompressed around a Foley catheter. There is apparent diffuse thickening of the bladder wall which may be partly related to underdistention. Cystitis is not excluded. Correlation with urinalysis recommended. Stomach/Bowel: There is extensive sigmoid diverticulosis without active inflammatory changes. There is no evidence of bowel obstruction or active inflammation. The  appendix is normal. 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 The abdominal aorta and IVC are otherwise grossly unremarkable on this noncontrast study. No portal venous gas identified. There is no adenopathy. Reproductive: The prostate and seminal vesicles are grossly unremarkable. Other: There has been significant interval decrease in the site of the perihepatic fluid collection compared to the prior studies. A right subcostal approach drainage catheter is seen with tip along the lateral capsule of the right lobe of the liver. Minimal amount of residual fluid and air remains. Small amount of free fluid is also seen within the pelvis. Musculoskeletal: There is diffuse subcutaneous edema and anasarca. No drainable fluid collection. Degenerative changes of the spine and median sternotomy wires. No acute osseous pathology. IMPRESSION: 1. Significant interval resolution of the previously seen perihepatic fluid collection status post drainage catheter placement. The tip of the drainage catheter is positioned along the lateral aspect of the right lobe of the liver superiorly. Very small amount of residual fluid and air remains. 2. Irregular hepatic contour likely changes of cirrhosis. 3. Extensive sigmoid diverticulosis.  No active inflammation. 4. Gallstone. Electronically Signed   By: Elgie Collard M.D.   On: 07/18/2017 20:28   Scheduled Meds: . allopurinol  300 mg Oral QODAY  . aspirin EC  81 mg Oral Daily  . chlorhexidine  15 mL Mouth Rinse BID  . collagenase   Topical Daily  . heparin  5,000 Units Subcutaneous Q8H  . mouth rinse  15 mL Mouth  Rinse q12n4p  . metoprolol succinate  12.5 mg Oral Daily  . multivitamin with minerals  1 tablet Oral Daily  . pantoprazole (PROTONIX) IV  40 mg Intravenous Q24H  . sodium chloride flush  5 mL Intravenous Q8H  . thiamine  100 mg Oral Daily   Or  . thiamine  100 mg Intravenous Daily   Continuous Infusions: .  piperacillin-tazobactam (ZOSYN)  IV Stopped (07/20/17 1048)    LOS: 5 days   Merlene Laughtermair Latif Maksim Peregoy, DO [facility] Pager [phone]  If 7PM-7AM, please contact night-coverage [email or web address] Password [facility] 07/20/2017, 4:43 PM

## 2017-07-20 NOTE — Care Management Important Message (Signed)
Important Message  Patient Details  Name: Frann RiderWayne A Crosson MRN: [id number] Date of Birth: 09/08/1946   Medicare Important Message Given:  Yes    Kyla BalzarineShealy, Sieanna Vanstone Abena 07/20/2017, 9:47 AM

## 2017-07-20 NOTE — Progress Notes (Signed)
Referring Physician(s): Dr Elwyn Lade Dr Patrick Jupiter  Supervising Physician: Oley Balm  Patient Status:  [facility] - In-pt  Chief Complaint:  10/24: S/p CT DRAIN RT ABDOMEN  Subjective:  Confused Up in bed Wife at bedside Output odorous and greenish color Wbc trending down Afeb   Allergies: Patient has no known allergies.  Medications: Prior to Admission medications   Medication Sig Start Date End Date Taking? Authorizing Provider  allopurinol (ZYLOPRIM) 300 MG tablet Take 300 mg by mouth daily.   Yes [provider]  aspirin EC 81 MG tablet Take 81 mg by mouth daily.   Yes [provider]  cephALEXin (KEFLEX) 500 MG capsule Take 500 mg by mouth 3 (three) times daily.   Yes [provider]  Cholecalciferol (VITAMIN D3) 2000 units TABS Take 1 tablet by mouth 2 (two) times daily.   Yes [provider]  Ferrous Sulfate (IRON) 325 (65 Fe) MG TABS Take 1 tablet by mouth 2 (two) times daily.    Yes [provider]  furosemide (LASIX) 20 MG tablet Take 20 mg by mouth daily.    Yes [provider]  metoprolol succinate (TOPROL-XL) 25 MG 24 hr tablet Take 12.5 mg by mouth every other day.    Yes [provider]  omeprazole (PRILOSEC) 20 MG capsule Take 20 mg by mouth daily.   Yes [provider]  potassium chloride SA (K-DUR,KLOR-CON) 20 MEQ tablet Take 20 mEq by mouth daily.   Yes [provider]  PREDNISONE PO Take 1 tablet by mouth daily.   Yes [provider]     Vital Signs: BP (!) 145/60 (BP Location: Right Arm)   Pulse 70   Temp 98.3 F (36.8 C) (Oral)   Resp (!) 9   Ht 5\' 10"  (1.778 m)   Wt 156 lb 4.9 oz (70.9 kg)   SpO2 97%   BMI 22.43 kg/m   Physical Exam  Abdominal: Soft. There is tenderness.  Musculoskeletal: Normal range of motion.  Skin: Skin is warm and dry.  Site of drain is clean Some minimal leaking at site OP is greenish color Over 300 cc yesterday; 100 in  bag now ++ odorous OP  Abundant Wbcs in Cxs---still pending growth   Nursing note and vitals reviewed.  Redressed leaky drain site; clean and dry  Imaging: Ct Abdomen Pelvis Wo Contrast  Result Date: 07/18/2017 CLINICAL DATA:  71 year old male with recent drainage of right abdominal abscess. Follow-up study. EXAM: CT ABDOMEN AND PELVIS WITHOUT CONTRAST TECHNIQUE: Multidetector CT imaging of the abdomen and pelvis was performed following the standard protocol without IV contrast. COMPARISON:  Abdominal CT dated 07/16/2017 and 07/18/2017 FINDINGS: Evaluation of this exam is limited in the absence of intravenous contrast. Evaluation is also limited due to respiratory motion artifact as well as streak artifact caused by patient's arms. Lower chest: Partially visualized small right pleural effusion with associated mild compressive atelectasis of the right lung base. Left lung base linear atelectasis/ scarring. There is mild cardiomegaly. Multi vessel coronary vascular calcification as well as calcification of the mitral annulus. There is hypoattenuation of the cardiac blood pool suggestive of a degree of anemia. Clinical correlation is recommended. Hepatobiliary: There is mild surface nodularity of the liver concerning for early changes of cirrhosis. Clinical correlation is recommended. No intrahepatic biliary ductal dilatation. The gallbladder is distended. There is a 1.8 cm stone within the gallbladder. No pericholecystic fluid. Pancreas: Unremarkable. No pancreatic ductal dilatation or surrounding inflammatory  changes. Spleen: Normal in size without focal abnormality. Adrenals/Urinary Tract: The adrenal glands are unremarkable. There is no hydronephrosis or nephrolithiasis on either side. The visualized ureters appear grossly unremarkable. The urinary bladder is decompressed around a Foley catheter. There is apparent diffuse thickening of the bladder wall which may be partly related to underdistention.  Cystitis is not excluded. Correlation with urinalysis recommended. Stomach/Bowel: There is extensive sigmoid diverticulosis without active inflammatory changes. There is no evidence of bowel obstruction or active inflammation. The appendix is normal. 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 The abdominal aorta and IVC are otherwise grossly unremarkable on this noncontrast study. No portal venous gas identified. There is no adenopathy. Reproductive: The prostate and seminal vesicles are grossly unremarkable. Other: There has been significant interval decrease in the site of the perihepatic fluid collection compared to the prior studies. A right subcostal approach drainage catheter is seen with tip along the lateral capsule of the right lobe of the liver. Minimal amount of residual fluid and air remains. Small amount of free fluid is also seen within the pelvis. Musculoskeletal: There is diffuse subcutaneous edema and anasarca. No drainable fluid collection. Degenerative changes of the spine and median sternotomy wires. No acute osseous pathology. IMPRESSION: 1. Significant interval resolution of the previously seen perihepatic fluid collection status post drainage catheter placement. The tip of the drainage catheter is positioned along the lateral aspect of the right lobe of the liver superiorly. Very small amount of residual fluid and air remains. 2. Irregular hepatic contour likely changes of cirrhosis. 3. Extensive sigmoid diverticulosis.  No active inflammation. 4. Gallstone. Electronically Signed   By: Elgie Collard M.D.   On: 07/18/2017 20:28   Ct Abdomen Pelvis Wo Contrast  Result Date: 07/16/2017 CLINICAL DATA:  Hematuria of unknown cause EXAM: CT ABDOMEN AND PELVIS WITHOUT CONTRAST TECHNIQUE: Multidetector CT imaging of the abdomen and pelvis was performed following the standard protocol without IV contrast. COMPARISON:  07/16/2017 FINDINGS: Lower chest: Lung bases  demonstrate mild dependent atelectasis. Small right-sided pleural effusion. Coronary calcifications. Heart size upper limits of normal. Hepatobiliary: Nodular liver contour suspect for cirrhosis. Calcified gallstone. No biliary dilatation. Pancreas: Unremarkable. No pancreatic ductal dilatation or surrounding inflammatory changes. Spleen: Normal in size without focal abnormality. Probable accessory splenule posterior to the spleen. Adrenals/Urinary Tract: Adrenal glands are within normal limits. No hydronephrosis. Urinary bladder slightly thick walled but underdistended Stomach/Bowel: Stomach nonenlarged. No dilated small bowel. No colon wall thickening. Normal appendix. Sigmoid colon diverticular disease without acute inflammation. Vascular/Lymphatic: Aortic atherosclerosis. No aneurysmal dilatation. No significantly enlarged abdominal or pelvic lymph nodes. Reproductive: Prostate is unremarkable. Other: Small amount of ascites in the pelvis. Large Terry hepatic gas and fluid collection measuring 16 cm AP by 3.6 cm thick by 21.6 cm cranial caudad. It is probably contiguous with an additional gas and fluid collection in the right lower quadrant measuring 11 cm. No significant rim enhancement. Some mass effect on the adjacent liver. No free air is seen. Musculoskeletal: Scoliosis and degenerative changes of the spine. Post sternotomy changes. No acute or suspicious bone lesion. IMPRESSION: 1. Large perihepatic gas and fluid collection that probably communicates with an additional large gas and fluid collection in the right lower quadrant of the abdomen. Differential considerations include loculated ascites with superimposed gas-forming infection, possible contained perforation although no diseased segments of large or small bowel are seen, and given the somewhat subcapsular appearance superiorly, old hematoma, possibly with infection. 2. Nodular contour of the liver suggests cirrhosis 3. Small  moderate pelvic ascites  4. Sigmoid colon diverticular disease without acute inflammation 5. Gallstone Electronically Signed   By: Jasmine PangKim  Fujinaga M.D.   On: 07/16/2017 20:02   Ct Head Wo Contrast  Result Date: 07/18/2017 CLINICAL DATA:  Altered level of consciousness, unexplained. EXAM: CT HEAD WITHOUT CONTRAST TECHNIQUE: Contiguous axial images were obtained from the base of the skull through the vertex without intravenous contrast. COMPARISON:  09/08/2005 FINDINGS: Brain: No acute finding by CT. Generalized brain atrophy. Chronic small-vessel ischemic changes throughout the hemispheric white matter, basal ganglia and thalami I. No mass lesion, hemorrhage, hydrocephalus or extra-axial collection. Vascular: There is atherosclerotic calcification of the major vessels at the base of the brain. Skull: Negative Sinuses/Orbits: Clear/normal Other: None IMPRESSION: No acute finding by CT. Atrophy and chronic small-vessel ischemic changes. Electronically Signed   By: Paulina FusiMark  Shogry M.D.   On: 07/18/2017 15:55   [place] Arterial Abi (screening Lower Extremity)  Result Date: 07/16/2017 CLINICAL DATA:  71 year old male with lower extremity ulceration and edema. EXAM: NONINVASIVE PHYSIOLOGIC VASCULAR STUDY OF BILATERAL LOWER EXTREMITIES TECHNIQUE: Evaluation of both lower extremities were performed at rest, including calculation of ankle-brachial indices with single level Doppler, pressure and pulse volume recording. COMPARISON:  None. FINDINGS: Right ABI:  1.2 Left ABI:  1.2 Right Lower Extremity:  Normal arterial waveforms at the ankle. Left Lower Extremity:  Normal arterial waveforms at the ankle. IMPRESSION: Normal bilateral resting ankle-brachial indices and arterial waveforms. Electronically Signed   By: Malachy MoanHeath  McCullough M.D.   On: 07/16/2017 13:38   Dg Chest Port 1 View  Result Date: 07/18/2017 CLINICAL DATA:  Status post placement of a perihepatic drain for abscess today. EXAM: PORTABLE CHEST 1 VIEW COMPARISON:  CT abdomen and pelvis  07/16/2017 and images from drain placement today. PA and lateral chest 07/15/2017. FINDINGS: Drainage catheter is identified in the right upper quadrant of the abdomen. There is mild elevation the right hemidiaphragm relative to the left, unchanged. Lungs are clear. No pneumothorax or pleural effusion. Patient status post median sternotomy. Heart size is upper normal. IMPRESSION: No acute cardiopulmonary disease. Drainage catheter in the right upper quadrant of the abdomen. Electronically Signed   By: Drusilla Kannerhomas  Dalessio M.D.   On: 07/18/2017 14:10   Ct Image Guided Drainage By Percutaneous Catheter  Result Date: 07/18/2017 INDICATION: Large right intra-abdominal abscess EXAM: CT DRAINAGE RIGHT INTRA-ABDOMINAL ABSCESS MEDICATIONS: The patient is currently admitted to the hospital and receiving intravenous antibiotics. The antibiotics were administered within an appropriate time frame prior to the initiation of the procedure. ANESTHESIA/SEDATION: Moderate Sedation Time:  None. The patient was continuously monitored during the procedure by the interventional radiology nurse under my direct supervision. COMPLICATIONS: None immediate. PROCEDURE: Informed written consent was obtained from the patient and his family after a thorough discussion of the procedural risks, benefits and alternatives. All questions were addressed. Maximal Sterile Barrier Technique was utilized including caps, mask, sterile gowns, sterile gloves, sterile drape, hand hygiene and skin antiseptic. A timeout was performed prior to the initiation of the procedure. Previous imaging reviewed. Patient positioned supine. Noncontrast localization CT performed to localize the right abdominal large abscess. Overlying skin marked in the right mid abdomen. Under sterile conditions and local anesthesia, an 18 gauge 10 cm access needle was advanced percutaneously into the large fluid collection. Needle position confirmed with CT. Amplatz guidewire inserted  followed by tract dilatation to insert a 16 French abscess drain. Drain catheter position confirmed with CT. Syringe aspiration yielded 1100 cc purulent fluid compatible with  abscess. Sample sent for Gram stain and culture. Catheter secured with a Prolene suture and connected to external gravity drainage bag. Patient tolerated the procedure well. No immediate complication. IMPRESSION: Successful CT-guided right abdominal abscess drain catheter insertion. Electronically Signed   By: Judie Petit.  Shick M.D.   On: 07/18/2017 12:52    Labs:  CBC:  Recent Labs  07/18/17 1330 07/19/17 0548 07/19/17 1147 07/20/17 0400  WBC 13.1* 10.4 8.7 8.0  HGB 8.8* 7.4* 7.7* 7.2*  HCT 26.0* 21.4* 22.4* 21.4*  PLT 368 283 249 250    COAGS:  Recent Labs  07/17/17 1041 07/18/17 1640  INR 1.21 1.21  APTT  --  33    BMP:  Recent Labs  07/17/17 0425 07/18/17 1330 07/19/17 0548 07/20/17 0400  NA 137 141 144 147*  K 5.5* 4.8 3.4* 2.9*  CL 112* 109 103 104  CO2 12* 14* 19* 24  GLUCOSE 82 84 95 97  BUN 91* 101* 98* 90*  CALCIUM 8.5* 8.8* 8.2* 8.3*  CREATININE 3.97* 4.29* 4.35* 4.25*  GFRNONAA 14* 13* 12* 13*  GFRAA 16* 15* 14* 15*    LIVER FUNCTION TESTS:  Recent Labs  07/16/17 0423 07/17/17 0425 07/18/17 1330 07/19/17 0548 07/20/17 0400  BILITOT 0.4  --  1.5* 1.6* 1.4*  AST 19  --  25 19 17   ALT 15*  --  16* 15* 13*  ALKPHOS 82  --  80 69 59  PROT 5.3*  --  5.8* 4.9* 4.9*  ALBUMIN 2.0* 1.9* 2.0* 1.6* 1.4*    Assessment and Plan:  Rt abd abscess drain placed 10/24 Cxs pending Redressed site Will recheck in am   Electronically Signed: Lakeesha Fontanilla A, PA-C 07/20/2017, 11:47 AM   I spent a total of 15 Minutes at the the patient's bedside AND on the patient's hospital floor or unit, greater than 50% of which was counseling/coordinating care for abdominal abscess drain

## 2017-07-20 NOTE — Progress Notes (Signed)
S:71 year old gentleman with hypertension, alcoholic liver cirrhosis,  HTN and CKD presented with generalized weakness, found to have had a peri hepatic abscess which was drained and worsening of renal function.  Patient was feeling little better when seen this morning, more alert and participating with exam and communicating. He remained afebrile with resolution of leukocytosis. Continued to have good amount of foul-smelling fluid and drain.  Creatinine remained stable, continue to make good amount of urine.  O:BP (!) 145/60 (BP Location: Right Arm)   Pulse 70   Temp 98.3 F (36.8 C) (Oral)   Resp (!) 9   Ht 5\' 10"  (1.778 m)   Wt 156 lb 4.9 oz (70.9 kg)   SpO2 97%   BMI 22.43 kg/m   Intake/Output Summary (Last 24 hours) at 07/20/17 1245 Last data filed at 07/20/17 1230  Gross per 24 hour  Intake          1345.83 ml  Output             2100 ml  Net          -754.17 ml   Intake/Output: I/O last 3 completed shifts: In: 3050.8 [I.V.:2300.8; IV Piggyback:750] Out: 2800 [Urine:2275; Drains:525]  Intake/Output this shift:  Total I/O In: -  Out: 475 [Urine:250; Drains:225] Weight change:  Gen: well-developed gentleman, alert and oriented to self and his wife, not oriented to time and place, in no acute distress. CVS: regular rate and rhythm. Gr2/6 M Resp: decreased breath sounds at right base, rest clear on anterior auscultation. Abd: soft, mild diffuse tenderness,RUQ drain in place with some leakage. Ext: 2+ LE or edema.   Recent Labs Lab 07/15/17 1833 07/16/17 0423 07/17/17 0425 07/18/17 1330 07/19/17 0548 07/20/17 0400  NA 133* 135 137 141 144 147*  K 5.6* 5.1 5.5* 4.8 3.4* 2.9*  CL 105 108 112* 109 103 104  CO2 15* 14* 12* 14* 19* 24  GLUCOSE 115* 75 82 84 95 97  BUN 93* 91* 91* 101* 98* 90*  CREATININE 3.90* 3.83* 3.97* 4.29* 4.35* 4.25*  ALBUMIN 2.2* 2.0* 1.9* 2.0* 1.6* 1.4*  CALCIUM 8.9 8.4* 8.5* 8.8* 8.2* 8.3*  PHOS  --   --  7.5* 6.4* 5.8* 5.3*  AST 22 19   --  25 19 17   ALT 17 15*  --  16* 15* 13*   Liver Function Tests:  Recent Labs Lab 07/18/17 1330 07/19/17 0548 07/20/17 0400  AST 25 19 17   ALT 16* 15* 13*  ALKPHOS 80 69 59  BILITOT 1.5* 1.6* 1.4*  PROT 5.8* 4.9* 4.9*  ALBUMIN 2.0* 1.6* 1.4*    Recent Labs Lab 07/15/17 1833  LIPASE 41    Recent Labs Lab 07/18/17 1640 07/19/17 0548  AMMONIA 52* 29   CBC:  Recent Labs Lab 07/17/17 0425  07/18/17 1330 07/19/17 0548 07/19/17 1147 07/20/17 0400  WBC 18.7*  --  13.1* 10.4 8.7 8.0  NEUTROABS  --   --  10.5* 7.8* 6.5 6.0  HGB 7.2*  --  8.8* 7.4* 7.7* 7.2*  HCT 21.2*  < > 26.0* 21.4* 22.4* 21.4*  MCV 92.2  --  89.3 87.3 88.2 89.5  PLT 363  --  368 283 249 250  < > = values in this interval not displayed. Cardiac Enzymes:  Recent Labs Lab 07/15/17 1833  TROPONINI <0.03   CBG: No results for input(s): GLUCAP in the last 168 hours.  Iron Studies: No results for input(s): IRON, TIBC, TRANSFERRIN, FERRITIN in the last  72 hours. Studies/Results: Ct Abdomen Pelvis Wo Contrast  Result Date: 07/18/2017 CLINICAL DATA:  71 year old male with recent drainage of right abdominal abscess. Follow-up study. EXAM: CT ABDOMEN AND PELVIS WITHOUT CONTRAST TECHNIQUE: Multidetector CT imaging of the abdomen and pelvis was performed following the standard protocol without IV contrast. COMPARISON:  Abdominal CT dated 07/16/2017 and 07/18/2017 FINDINGS: Evaluation of this exam is limited in the absence of intravenous contrast. Evaluation is also limited due to respiratory motion artifact as well as streak artifact caused by patient's arms. Lower chest: Partially visualized small right pleural effusion with associated mild compressive atelectasis of the right lung base. Left lung base linear atelectasis/ scarring. There is mild cardiomegaly. Multi vessel coronary vascular calcification as well as calcification of the mitral annulus. There is hypoattenuation of the cardiac blood pool  suggestive of a degree of anemia. Clinical correlation is recommended. Hepatobiliary: There is mild surface nodularity of the liver concerning for early changes of cirrhosis. Clinical correlation is recommended. No intrahepatic biliary ductal dilatation. The gallbladder is distended. There is a 1.8 cm stone within the gallbladder. No pericholecystic fluid. Pancreas: Unremarkable. No pancreatic ductal dilatation or surrounding inflammatory changes. Spleen: Normal in size without focal abnormality. Adrenals/Urinary Tract: The adrenal glands are unremarkable. There is no hydronephrosis or nephrolithiasis on either side. The visualized ureters appear grossly unremarkable. The urinary bladder is decompressed around a Foley catheter. There is apparent diffuse thickening of the bladder wall which may be partly related to underdistention. Cystitis is not excluded. Correlation with urinalysis recommended. Stomach/Bowel: There is extensive sigmoid diverticulosis without active inflammatory changes. There is no evidence of bowel obstruction or active inflammation. The appendix is normal. 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 The abdominal aorta and IVC are otherwise grossly unremarkable on this noncontrast study. No portal venous gas identified. There is no adenopathy. Reproductive: The prostate and seminal vesicles are grossly unremarkable. Other: There has been significant interval decrease in the site of the perihepatic fluid collection compared to the prior studies. A right subcostal approach drainage catheter is seen with tip along the lateral capsule of the right lobe of the liver. Minimal amount of residual fluid and air remains. Small amount of free fluid is also seen within the pelvis. Musculoskeletal: There is diffuse subcutaneous edema and anasarca. No drainable fluid collection. Degenerative changes of the spine and median sternotomy wires. No acute osseous pathology. IMPRESSION:  1. Significant interval resolution of the previously seen perihepatic fluid collection status post drainage catheter placement. The tip of the drainage catheter is positioned along the lateral aspect of the right lobe of the liver superiorly. Very small amount of residual fluid and air remains. 2. Irregular hepatic contour likely changes of cirrhosis. 3. Extensive sigmoid diverticulosis.  No active inflammation. 4. Gallstone. Electronically Signed   By: Elgie Collard M.D.   On: 07/18/2017 20:28   Ct Head Wo Contrast  Result Date: 07/18/2017 CLINICAL DATA:  Altered level of consciousness, unexplained. EXAM: CT HEAD WITHOUT CONTRAST TECHNIQUE: Contiguous axial images were obtained from the base of the skull through the vertex without intravenous contrast. COMPARISON:  09/08/2005 FINDINGS: Brain: No acute finding by CT. Generalized brain atrophy. Chronic small-vessel ischemic changes throughout the hemispheric white matter, basal ganglia and thalami I. No mass lesion, hemorrhage, hydrocephalus or extra-axial collection. Vascular: There is atherosclerotic calcification of the major vessels at the base of the brain. Skull: Negative Sinuses/Orbits: Clear/normal Other: None IMPRESSION: No acute finding by CT. Atrophy and chronic small-vessel ischemic changes. Electronically Signed  By: Paulina Fusi M.D.   On: 07/18/2017 15:55   Dg Chest Port 1 View  Result Date: 07/18/2017 CLINICAL DATA:  Status post placement of a perihepatic drain for abscess today. EXAM: PORTABLE CHEST 1 VIEW COMPARISON:  CT abdomen and pelvis 07/16/2017 and images from drain placement today. PA and lateral chest 07/15/2017. FINDINGS: Drainage catheter is identified in the right upper quadrant of the abdomen. There is mild elevation the right hemidiaphragm relative to the left, unchanged. Lungs are clear. No pneumothorax or pleural effusion. Patient status post median sternotomy. Heart size is upper normal. IMPRESSION: No acute  cardiopulmonary disease. Drainage catheter in the right upper quadrant of the abdomen. Electronically Signed   By: Drusilla Kanner M.D.   On: 07/18/2017 14:10   . allopurinol  300 mg Oral QODAY  . aspirin EC  81 mg Oral Daily  . chlorhexidine  15 mL Mouth Rinse BID  . collagenase   Topical Daily  . heparin  5,000 Units Subcutaneous Q8H  . mouth rinse  15 mL Mouth Rinse q12n4p  . metoprolol succinate  12.5 mg Oral Daily  . multivitamin with minerals  1 tablet Oral Daily  . pantoprazole (PROTONIX) IV  40 mg Intravenous Q24H  . sodium chloride flush  5 mL Intravenous Q8H  . thiamine  100 mg Oral Daily   Or  . thiamine  100 mg Intravenous Daily    BMET    Component Value Date/Time   NA 147 (H) 07/20/2017 0400   K 2.9 (L) 07/20/2017 0400   CL 104 07/20/2017 0400   CO2 24 07/20/2017 0400   GLUCOSE 97 07/20/2017 0400   BUN 90 (H) 07/20/2017 0400   CREATININE 4.25 (H) 07/20/2017 0400   CALCIUM 8.3 (L) 07/20/2017 0400   GFRNONAA 13 (L) 07/20/2017 0400   GFRAA 15 (L) 07/20/2017 0400   CBC    Component Value Date/Time   WBC 8.0 07/20/2017 0400   RBC 2.39 (L) 07/20/2017 0400   HGB 7.2 (L) 07/20/2017 0400   HCT 21.4 (L) 07/20/2017 0400   PLT 250 07/20/2017 0400   MCV 89.5 07/20/2017 0400   MCH 30.1 07/20/2017 0400   MCHC 33.6 07/20/2017 0400   RDW 12.7 07/20/2017 0400   LYMPHSABS 1.0 07/20/2017 0400   MONOABS 1.0 07/20/2017 0400   EOSABS 0.0 07/20/2017 0400   BASOSABS 0.0 07/20/2017 0400     Assessment/Plan:  1. AKI. Creatinine remained stable, clinically seems improving. Not developed hypokalemia, hyponatremia, hyperphosphatemia and low magnesium. -discontinue bicarbonate infusion. -Replace electrolytes. -Repeat BMP around 4 PM, his sodium remained elevated he can be started on half normal saline with dextrose. -continue monitoring renal function. -repeat urinary culture.  Abdominal abscess. Perihepatic abscess fluid is growing multiple organisms, most likely gut  flora. Definitive organism identification and culture results are pending. -Continue antibiotics.  Anemia. Hemoglobin decreased to 7.2 today. -one unit of packed RBCs were ordered. -Keep monitoring.  Hypertension. Blood pressure mildly elevated today.Marland Kitchen Keep monitoring.  Alcohol abuse. Continue CIWA protocol.  Altered mental status. Improving. Patient was more alert and following commands today. I have seen and examined this patient and agree with the plan of care seen, eval, examined, discussed with family, resident. .  Jennetta Flood L 07/20/2017, 2:22 PM

## 2017-07-20 NOTE — Progress Notes (Addendum)
Per lab, do not have capacity to run total bili test on fluid from RUQ drain and can not send out.  Relayed to attending MD. Per Dr. Marland McalpineSheikh, IR placed order. Relayed to on call IR MD

## 2017-07-20 NOTE — Progress Notes (Signed)
Physical Therapy Treatment Patient Details Name: Jonathan Walker MRN: [id number] DOB: [birth year] Today's Date: 07/20/2017    History of Present Illness Pt adm to [facility] wtih weakness and found to have AKI. Pt also found to have large perihepatic intraabdominal abscess. Drain placed on 10/24 by IR on PMH - CAD status post CABG, htn, gout, alcoholic liver cirrhosis    PT Comments    Pt re-assessed after transfer from [facility] and additional medical issues. Pt much weaker than on eval at [facility]. Goals reset. Continue to feel pt will need ST-SNF. Pt with heavy lean to left in sitting. Strength equally weak on rt and lt.    Follow Up Recommendations  SNF;Supervision/Assistance - 24 hour     Equipment Recommendations  Other (comment) (To be assessed at next venue)    Recommendations for Other Services       Precautions / Restrictions Precautions Precautions: Fall Restrictions Weight Bearing Restrictions: No    Mobility  Bed Mobility Overal bed mobility: Needs Assistance Bed Mobility: Supine to Sit;Sit to Supine     Supine to sit: +2 for physical assistance;Max assist;HOB elevated Sit to supine: +2 for physical assistance;Total assist   General bed mobility comments: Assist to bring legs off bed, elevate trunk into sitting, and bring hips to EOB. Assist for all aspects to return to supine  Transfers                 General transfer comment: Unable to attempt due to heavy assist to sit EOB  Ambulation/Gait             General Gait Details: Unable   Stairs            Wheelchair Mobility    Modified [name] (Stroke Patients Only)       Balance Overall balance assessment: Needs assistance Sitting-balance support: Feet supported;Bilateral upper extremity supported Sitting balance-Leahy Scale: Zero Sitting balance - Comments: Sat EOB x 6-8 minutes with mod to max assist. Tried propping on rt forearm to help correct lt lateral lean with no  improvement Postural control: Left lateral lean;Posterior lean                                  Cognition Arousal/Alertness: Awake/alert Behavior During Therapy: WFL for tasks assessed/performed Overall Cognitive Status: Impaired/Different from baseline Area of Impairment: Orientation;Attention;Memory;Following commands;Safety/judgement;Problem solving                 Orientation Level: Disoriented to;Place;Time;Situation Current Attention Level: Sustained Memory: Decreased recall of precautions;Decreased short-term memory Following Commands: Follows one step commands consistently;Follows one step commands with increased time Safety/Judgement: Decreased awareness of safety;Decreased awareness of deficits   Problem Solving: Slow processing;Decreased initiation;Requires verbal cues;Requires tactile cues        Exercises      General Comments        Pertinent Vitals/Pain      Home Living                      Prior Function            PT Goals (current goals can now be found in the care plan section) Acute Rehab PT Goals PT Goal Formulation: With patient/family Time For Goal Achievement: 08/03/17 Potential to Achieve Goals: Good Progress towards PT goals: Goals downgraded-see care plan    Frequency    Min 3X/week  PT Plan Current plan remains appropriate    Co-evaluation              AM-PAC PT "6 Clicks" Daily Activity  Outcome Measure  Difficulty turning over in bed (including adjusting bedclothes, sheets and blankets)?: Unable Difficulty moving from lying on back to sitting on the side of the bed? : Unable Difficulty sitting down on and standing up from a chair with arms (e.g., wheelchair, bedside commode, etc,.)?: Unable Help needed moving to and from a bed to chair (including a wheelchair)?: Total Help needed walking in hospital room?: Total Help needed climbing 3-5 steps with a railing? : Total 6 Click Score:  6    End of Session Equipment Utilized During Treatment: Gait belt Activity Tolerance: Patient limited by fatigue Patient left: in bed;with call bell/phone within reach;with family/visitor present;with bed alarm set Nurse Communication: Mobility status PT Visit Diagnosis: Unsteadiness on feet (R26.81);Other abnormalities of gait and mobility (R26.89);Muscle weakness (generalized) (M62.81)     Time: [id number] PT Time Calculation (min) (ACUTE ONLY): 19 min  Charges:                       G CodesSkip Mayer:       Jaela Yepez PT [phone]    Angelina OkCary W [facility] 07/20/2017, 1:32 PM

## 2017-07-20 NOTE — Care Management Note (Signed)
Case Management Note  Patient Details  Name: Frann RiderWayne A Wynder MRN: [id number] Date of Birth: 08/01/1946  Subjective/Objective:   From home with wife, presents with AKI, polyarteritis, normocytic anemia, intra abd abscess , acute encephalopathy, CAD, gout, htn, ulcers of both le, hyperammonemia, hx of etoh. Per pt eval rec SNF, CSW following.                   Action/Plan: NCM will follow along with CSW for dc needs.  Expected Discharge Date:                  Expected Discharge Plan:  Skilled Nursing Facility  In-House Referral:  Clinical Social Work  Discharge planning Services  CM Consult  Post Acute Care Choice:    Choice offered to:     DME Arranged:    DME Agency:     HH Arranged:    HH Agency:     Status of Service:  Completed, signed off  If discussed at [employer] Meetings, dates discussed:    Additional Comments:  Leone Havenaylor, Latoyia Tecson Clinton, RN 07/20/2017, 12:28 PM

## 2017-07-20 NOTE — Evaluation (Signed)
Clinical/Bedside Swallow Evaluation Patient Details  Name: Jonathan Walker MRN: [id number] Date of Birth: 09/20/1946  Today's Date: 07/20/2017 Time: SLP Start Time (ACUTE ONLY): 1044 SLP Stop Time (ACUTE ONLY): 1110 SLP Time Calculation (min) (ACUTE ONLY): 26 min  Past Medical History:  Past Medical History:  Diagnosis Date  . Coronary artery disease   . Gout   . Hyperlipidemia   . Hypertension   . Neuropathy    Past Surgical History:  Past Surgical History:  Procedure Laterality Date  . ABDOMINAL SURGERY    . AORTIC VALVE REPAIR    . CARDIAC CATHETERIZATION    . CORONARY ARTERY BYPASS GRAFT     HPI:  Patient is a 71 year old CAD s/p CABG, hypertension, hyperlipidemia, gout who resents with weakness and was found to have AKI. He was diagnosed with polyarteritis nodosum and given keflex and prednisone taper which have helped his ulcers. He had a weakly positive ANA and CANCA, both titers were 1:80 earlier this month. Creatinine has worsened and [facility] and had a drain placed for an intra-abdominal abscess.   Assessment / Plan / Recommendation Clinical Impression  Pt given small amount of PO to assess capacity to consume Po safely as RN was concerned about pt mentation with PO. Pt drowsy, but attended to PO intake. Mild oral holding/rolling of bolus noted with water. Large sips followed by delayed coughing though small cup sips toelrated well. Pt also able to attend to purees and masticate solids adequately. Pt is not ready to initiate a diet per surgeon, but when he is able to start PO intake recommend small sisngle sips only and no straws. Will follow for readiness to initiate diet for further observation of tolerance and possible need for f/u interventions.  SLP Visit Diagnosis: Dysphagia, oropharyngeal phase (R13.12)    Aspiration Risk  Mild aspiration risk    Diet Recommendation Regular;Thin liquid   Liquid Administration via: Cup;No  straw Medication Administration: Whole meds with liquid Supervision: Staff to assist with self feeding;Full supervision/cueing for compensatory strategies Compensations: Slow rate;Small sips/bites Postural Changes: Seated upright at 90 degrees    Other  Recommendations Oral Care Recommendations: Oral care BID   Follow up Recommendations None      Frequency and Duration min 1 x/week  1 week       Prognosis Prognosis for Safe Diet Advancement: Good Barriers to Reach Goals: Cognitive deficits      Swallow Study   General HPI: Patient is a 71 year old CAD s/p CABG, hypertension, hyperlipidemia, gout who resents with weakness and was found to have AKI. He was diagnosed with polyarteritis nodosum and given keflex and prednisone taper which have helped his ulcers. He had a weakly positive ANA and CANCA, both titers were 1:80 earlier this month. Creatinine has worsened and [facility] and had a drain placed for an intra-abdominal abscess. Type of Study: Bedside Swallow Evaluation Diet Prior to this Study: NPO Temperature Spikes Noted: No Respiratory Status: Room air History of Recent Intubation: No Behavior/Cognition: Alert;Cooperative;Requires cueing Oral Cavity Assessment: Within Functional Limits Oral Care Completed by SLP: No Oral Cavity - Dentition: Edentulous Vision: Functional for self-feeding Self-Feeding Abilities: Total assist Baseline Vocal Quality: Low vocal intensity Volitional Cough: Strong Volitional Swallow: Able to elicit    Oral/Motor/Sensory Function Overall Oral Motor/Sensory Function: Within functional limits   Ice Chips     Thin Liquid Thin Liquid: Impaired Presentation: Cup;Straw Oral Phase Functional Implications: Oral holding Pharyngeal  Phase  Impairments: Cough - Immediate;Other (comments)    Nectar Thick Nectar Thick Liquid: Not tested   Honey Thick Honey Thick Liquid: Not tested   Puree Puree: Within functional  limits Presentation: Spoon   Solid   GO   Solid: Within functional limits        Silvina Hackleman, Riley Nearing 07/20/2017,12:07 PM

## 2017-07-20 NOTE — Progress Notes (Signed)
Received call from RN re: bilirubin test on abdominal fluid.  Per lab, unable to obtain.  Spoke with Dr. Deanne CofferHassell.  Will continue monitor drain output over the next few days and could consider drain injection early next week if output trend continues as is.   Loyce DysKacie Matthews, MS RD PA-C 3:31 PM

## 2017-07-21 ENCOUNTER — Inpatient Hospital Stay (HOSPITAL_COMMUNITY): Payer: Medicare Other

## 2017-07-21 DIAGNOSIS — L0291 Cutaneous abscess, unspecified: Secondary | ICD-10-CM

## 2017-07-21 DIAGNOSIS — J90 Pleural effusion, not elsewhere classified: Secondary | ICD-10-CM

## 2017-07-21 DIAGNOSIS — K631 Perforation of intestine (nontraumatic): Secondary | ICD-10-CM

## 2017-07-21 LAB — CBC WITH DIFFERENTIAL/PLATELET
BASOS ABS: 0 10*3/uL (ref 0.0–0.1)
BASOS PCT: 0 %
EOS PCT: 0 %
Eosinophils Absolute: 0 10*3/uL (ref 0.0–0.7)
HCT: 23.9 % — ABNORMAL LOW (ref 39.0–52.0)
Hemoglobin: 8 g/dL — ABNORMAL LOW (ref 13.0–17.0)
LYMPHS PCT: 17 %
Lymphs Abs: 1.1 10*3/uL (ref 0.7–4.0)
MCH: 30.2 pg (ref 26.0–34.0)
MCHC: 33.5 g/dL (ref 30.0–36.0)
MCV: 90.2 fL (ref 78.0–100.0)
MONO ABS: 0.6 10*3/uL (ref 0.1–1.0)
Monocytes Relative: 10 %
NEUTROS ABS: 4.4 10*3/uL (ref 1.7–7.7)
Neutrophils Relative %: 73 %
PLATELETS: 183 10*3/uL (ref 150–400)
RBC: 2.65 MIL/uL — ABNORMAL LOW (ref 4.22–5.81)
RDW: 14.1 % (ref 11.5–15.5)
WBC: 6.1 10*3/uL (ref 4.0–10.5)

## 2017-07-21 LAB — BPAM RBC
Blood Product Expiration Date: 201811212359
ISSUE DATE / TIME: 201810261236
Unit Type and Rh: 5100

## 2017-07-21 LAB — TYPE AND SCREEN
ABO/RH(D): O POS
ANTIBODY SCREEN: NEGATIVE
Unit division: 0

## 2017-07-21 LAB — AEROBIC/ANAEROBIC CULTURE W GRAM STAIN (SURGICAL/DEEP WOUND): Special Requests: NORMAL

## 2017-07-21 LAB — RENAL FUNCTION PANEL
ALBUMIN: 1.3 g/dL — AB (ref 3.5–5.0)
ANION GAP: 19 — AB (ref 5–15)
BUN: 80 mg/dL — AB (ref 6–20)
CHLORIDE: 103 mmol/L (ref 101–111)
CO2: 24 mmol/L (ref 22–32)
Calcium: 8.3 mg/dL — ABNORMAL LOW (ref 8.9–10.3)
Creatinine, Ser: 4.09 mg/dL — ABNORMAL HIGH (ref 0.61–1.24)
GFR calc Af Amer: 16 mL/min — ABNORMAL LOW (ref >60)
GFR, EST NON AFRICAN AMERICAN: 13 mL/min — AB (ref >60)
GLUCOSE: 91 mg/dL (ref 65–99)
PHOSPHORUS: 5.3 mg/dL — AB (ref 2.5–4.6)
POTASSIUM: 2.9 mmol/L — AB (ref 3.5–5.1)
Sodium: 146 mmol/L — ABNORMAL HIGH (ref 135–145)

## 2017-07-21 LAB — COMPREHENSIVE METABOLIC PANEL
ALBUMIN: 1.4 g/dL — AB (ref 3.5–5.0)
ALT: 12 U/L — ABNORMAL LOW (ref 17–63)
AST: 15 U/L (ref 15–41)
Alkaline Phosphatase: 52 U/L (ref 38–126)
Anion gap: 17 — ABNORMAL HIGH (ref 5–15)
BUN: 79 mg/dL — AB (ref 6–20)
CHLORIDE: 105 mmol/L (ref 101–111)
CO2: 25 mmol/L (ref 22–32)
Calcium: 8.4 mg/dL — ABNORMAL LOW (ref 8.9–10.3)
Creatinine, Ser: 4.08 mg/dL — ABNORMAL HIGH (ref 0.61–1.24)
GFR calc Af Amer: 16 mL/min — ABNORMAL LOW (ref >60)
GFR calc non Af Amer: 13 mL/min — ABNORMAL LOW (ref >60)
GLUCOSE: 92 mg/dL (ref 65–99)
POTASSIUM: 2.9 mmol/L — AB (ref 3.5–5.1)
SODIUM: 147 mmol/L — AB (ref 135–145)
Total Bilirubin: 1.2 mg/dL (ref 0.3–1.2)
Total Protein: 4.9 g/dL — ABNORMAL LOW (ref 6.5–8.1)

## 2017-07-21 LAB — MAGNESIUM: MAGNESIUM: 1.8 mg/dL (ref 1.7–2.4)

## 2017-07-21 LAB — URINE CULTURE

## 2017-07-21 LAB — AEROBIC/ANAEROBIC CULTURE (SURGICAL/DEEP WOUND)

## 2017-07-21 LAB — PHOSPHORUS: Phosphorus: 5.2 mg/dL — ABNORMAL HIGH (ref 2.5–4.6)

## 2017-07-21 MED ORDER — POTASSIUM CHLORIDE 10 MEQ/100ML IV SOLN
10.0000 meq | INTRAVENOUS | Status: AC
  Administered 2017-07-21 (×6): 10 meq via INTRAVENOUS
  Filled 2017-07-21: qty 100

## 2017-07-21 NOTE — Progress Notes (Signed)
S:71 year old gentleman with hypertension, alcoholic liver cirrhosis, HTN and CKD presented with generalized weakness, found to have had a perihepatic abscess with duodenal perforation, which was drained and worsening of renal function.  Patient was little better today, still confused but was able to communicate and participate with exam. Creatinine to remain stable, continue to make dark-colored urine.  O:BP (!) 152/75 (BP Location: Right Arm)   Pulse 61   Temp 97.7 F (36.5 C) (Oral)   Resp 11   Ht 5\' 10"  (1.778 m)   Wt 156 lb 4.9 oz (70.9 kg)   SpO2 96%   BMI 22.43 kg/m   Intake/Output Summary (Last 24 hours) at 07/21/17 1500 Last data filed at 07/21/17 1344  Gross per 24 hour  Intake             1000 ml  Output              965 ml  Net               35 ml   Intake/Output: I/O last 3 completed shifts: In: 1295 [I.V.:510; Blood:435; IV Piggyback:350] Out: 2290 [Urine:1825; Drains:465]  Intake/Output this shift:  Total I/O In: 355 [Other:5; IV Piggyback:350] Out: -  Weight change:  Gen: well-developed gentleman,A & O X 2,in no acute distress. CVS: regular rate and rhythm.  Resp: decreased breath sounds at right base, rest clear on anterior auscultation.Rales R base Abd: soft, mild diffuse tenderness,RUQ drain in place with some leakage. feculant smell Ext: 2+ LE or edema.    Recent Labs Lab 07/15/17 1833 07/16/17 0423 07/17/17 0425 07/18/17 1330 07/19/17 0548 07/20/17 0400 07/20/17 1911 07/21/17 0453  NA 133* 135 137 141 144 147* 145 147*  146*  K 5.6* 5.1 5.5* 4.8 3.4* 2.9* 3.2* 2.9*  2.9*  CL 105 108 112* 109 103 104 104 105  103  CO2 15* 14* 12* 14* 19* 24 25 25  24   GLUCOSE 115* 75 82 84 95 97 94 92  91  BUN 93* 91* 91* 101* 98* 90* 83* 79*  80*  CREATININE 3.90* 3.83* 3.97* 4.29* 4.35* 4.25* 4.04* 4.08*  4.09*  ALBUMIN 2.2* 2.0* 1.9* 2.0* 1.6* 1.4*  --  1.4*  1.3*  CALCIUM 8.9 8.4* 8.5* 8.8* 8.2* 8.3* 8.3* 8.4*  8.3*  PHOS  --   --  7.5*  6.4* 5.8* 5.3*  --  5.2*  5.3*  AST 22 19  --  25 19 17   --  15  ALT 17 15*  --  16* 15* 13*  --  12*   Liver Function Tests:  Recent Labs Lab 07/19/17 0548 07/20/17 0400 07/21/17 0453  AST 19 17 15   ALT 15* 13* 12*  ALKPHOS 69 59 52  BILITOT 1.6* 1.4* 1.2  PROT 4.9* 4.9* 4.9*  ALBUMIN 1.6* 1.4* 1.4*  1.3*    Recent Labs Lab 07/15/17 1833  LIPASE 41    Recent Labs Lab 07/18/17 1640 07/19/17 0548  AMMONIA 52* 29   CBC:  Recent Labs Lab 07/18/17 1330 07/19/17 0548 07/19/17 1147 07/20/17 0400 07/20/17 1911 07/21/17 0453  WBC 13.1* 10.4 8.7 8.0  --  6.1  NEUTROABS 10.5* 7.8* 6.5 6.0  --  4.4  HGB 8.8* 7.4* 7.7* 7.2* 9.1* 8.0*  HCT 26.0* 21.4* 22.4* 21.4* 27.2* 23.9*  MCV 89.3 87.3 88.2 89.5  --  90.2  PLT 368 283 249 250  --  183   Cardiac Enzymes:  Recent Labs Lab 07/15/17 1833  TROPONINI <0.03   CBG:  Recent Labs Lab 07/20/17 1135  GLUCAP 102*    Iron Studies: No results for input(s): IRON, TIBC, TRANSFERRIN, FERRITIN in the last 72 hours. Studies/Results: No results found. Marland Kitchen. allopurinol  300 mg Oral QODAY  . aspirin EC  81 mg Oral Daily  . chlorhexidine  15 mL Mouth Rinse BID  . collagenase   Topical Daily  . heparin  5,000 Units Subcutaneous Q8H  . mouth rinse  15 mL Mouth Rinse q12n4p  . metoprolol succinate  12.5 mg Oral Daily  . multivitamin with minerals  1 tablet Oral Daily  . pantoprazole (PROTONIX) IV  40 mg Intravenous Q24H  . sodium chloride flush  5 mL Intravenous Q8H  . thiamine  100 mg Oral Daily   Or  . thiamine  100 mg Intravenous Daily    BMET    Component Value Date/Time   NA 146 (H) 07/21/2017 0453   NA 147 (H) 07/21/2017 0453   K 2.9 (L) 07/21/2017 0453   K 2.9 (L) 07/21/2017 0453   CL 103 07/21/2017 0453   CL 105 07/21/2017 0453   CO2 24 07/21/2017 0453   CO2 25 07/21/2017 0453   GLUCOSE 91 07/21/2017 0453   GLUCOSE 92 07/21/2017 0453   BUN 80 (H) 07/21/2017 0453   BUN 79 (H) 07/21/2017 0453    CREATININE 4.09 (H) 07/21/2017 0453   CREATININE 4.08 (H) 07/21/2017 0453   CALCIUM 8.3 (L) 07/21/2017 0453   CALCIUM 8.4 (L) 07/21/2017 0453   GFRNONAA 13 (L) 07/21/2017 0453   GFRNONAA 13 (L) 07/21/2017 0453   GFRAA 16 (L) 07/21/2017 0453   GFRAA 16 (L) 07/21/2017 0453   CBC    Component Value Date/Time   WBC 6.1 07/21/2017 0453   RBC 2.65 (L) 07/21/2017 0453   HGB 8.0 (L) 07/21/2017 0453   HCT 23.9 (L) 07/21/2017 0453   PLT 183 07/21/2017 0453   MCV 90.2 07/21/2017 0453   MCH 30.2 07/21/2017 0453   MCHC 33.5 07/21/2017 0453   RDW 14.1 07/21/2017 0453   LYMPHSABS 1.1 07/21/2017 0453   MONOABS 0.6 07/21/2017 0453   EOSABS 0.0 07/21/2017 0453   BASOSABS 0.0 07/21/2017 0453     Assessment/Plan:  AKI. Creatinine remained stable, clinically seems improving.We anticipate or improvement has the infection resolves.has decreased breath sounds at right lung base, might be developing pleural effusion. ?extension of abscess into pleural space -repeat chest x-ray, if worsening diffusion, needs to be drained. -Continue monitoring renal function.  Hypokalemia. Replete electrolytes, magnesium normal this morning after replacement yesterday.  Abdominal abscess ? perforation. Still with a good amount of draining fluid. Surgery and interventional radiology is following. Surgery do not think that he is a good candidate for surgery. ? Developing fistula -Continue antibiotics and monitoring.  Anemia.Hemoglobin started dropping again after one unit of packed RBCs yesterday. -Monitor hemoglobin and transfuse as needed.  Hypertension. Blood pressure mildly elevated today.Marland Kitchen. Keep monitoring.  Alcohol abuse. Continue CIWAprotocol. Encephalopathy slowly better CAD I have seen and examined this patient and agree with the plan of care seen, eval, examined, counseled family,discussed with primary and resident .  Mihir Flanigan L 07/21/2017, 4:29 PM   Altered mental status. Improving.  Patient was more alert and following commands today.

## 2017-07-21 NOTE — Progress Notes (Signed)
PROGRESS NOTE    Jonathan Walker  ZOX:[id number] DOB: [birth year] DOA: 07/15/2017 PCP: Samuel Jester, DO   Brief Narrative:  Patient is a 71 year old CAD s/p CABG, hypertension, hyperlipidemia, gout who resents with weakness and was found to have AKI.  His lasix was increased from every other day to daily dosing about 1 month ago.  For the last few days, however, he has not made very much urine output and he has become very fatigued.  Additionally, he was seen by dermatology, Dr. Margo Aye this month for some new right lower extremity ulcers.  They were biopsied and biopsy demonstrated no evidence of vasculitis, but sample was superficial and nonspecific.  He was diagnosed with polyarteritis nodosum and given keflex and prednisone taper which have helped his ulcers.  He had a weakly positive ANA and CANCA, both titers were 1:80 earlier this month. Creatinine has worsened and patient was transferred to [facility] and had a drain placed for an intra-abdominal abscess. Patient was more alert and interactive today but still remains slightly confused. Drain is leaking quite significantly and plan is for Drain Injection Monday 07/23/17.   Assessment & Plan:   Active Problems:   AKI (acute kidney injury) (HCC)   Polyarteritis nodosa (HCC)   Normocytic anemia   Intra-abdominal abscess (HCC)   Acute encephalopathy   CAD (coronary artery disease)   Gout   Essential hypertension   Ulcers of both lower legs (HCC)   Hyperammonemia (HCC)   History of alcoholism (HCC)   Hypernatremia   Hypokalemia   Hypomagnesemia   Hyperphosphatemia  Acute kidney injury with microscopic Hematuria/Evidence of possible nephritis.   -Baseline creatinine is 0.9 on May 8th, 2018.  On and at that time his lasix dose was increased.   -Creatinine 1.38 on Oct 1st.  Possible this may all be due to ATN from dehydration/overdiuresis, but autoimmune disorder also possible. Cr has worsened in the setting of Abdominal Infection/Abscess;  Slowly improving and now Cr is 4.09 -Nephrology consultation appreciated -ANA has been repeated -Mpo/pr-3 ANCA Ab WNL  -Complement:  C3 70 (Low), CH 50 and C4 wnl -HCV negative and HBV negative -HIV Non-Reactive -UPEP being collected -Bicarb gtt 50 mL/hr by Nephro now stopped by Nephro -Started Lasix for hyperkalemia but held by Nephrology  -Patiromer stopped Nephrology -Avoid Kayexelate in setting of occult positive stool and unusual abdominal fluid collection -Renal US:  Minimally complex bilateral renal cysts, unchanged from prior -May need renal biopsy but will hold off for now and if Cr still stays up will biopsy in a few weeks. 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  -C/w IV Zosyn  -Nephro now repeating BMP today and if Na+ is still elevated patient to be started on D5W 1/2 NS IVF  Hypernatremia -Mild at 146-147 -Bicarbonate gtt now stopped -Repeat BMP pending today -If Na+ is still elevated patient to be started on D5W 1/2 NS IVF -Repeat CMP in AM  Hypokalemia -Patient's K+ was 2.9 this AM -Replete with IV KCl 40 meQ yesterday and with IV KCl 60 mEQ today  -Continue to Monitor and Replete as Necessary -Repeat CMP in AM  Hypomagnesemia -Patient's Mag Level 1.6 and improved to 1.8 -Replete with IV Mag Sulfate 1 gram yesterday -Continue to Monitor and Replete as Necessary -Repeat Mag Level in AM  Hyperphosphatemia -Patient's Phos Level was 5.8 and went to 5.3  -Likely to improve if Kidney Fxn starts improving -Repeat Phos Level in AM  Intra-abdominal Abscess s/p Drain by IR with Duodenal Leak/Perforation  -Was originally suscpected infected ascites given suggestion of cirrhosis versus blood versus contained bowel perforation; Suspect likely from bowel or enteric source and now thinking it is a perforated duodenum -CT scan: Large fluid collection around the liver extending into the right lower  quadrant with air fluid level -Transferred to [facility] for IR paracentesis and drain placement -IR drained 1.1 Liter of purulent discharge and drainage tube showed cloudy grey purulent discharge  -IR sending Fluid for Bilirubin and if elevated will proceed with HIDA Scan to evaluate for Bile Leak; However unable to obtain Bilrubin Test on Abscess fluid per Lab; Per IR continue to Monitor Drain Output and because patient has Copious amount of Drainage will need Drain Injection and is ordered for Monday 07/23/17 -Ordered paracentesis labs with cytology and a hematocrit (in case it's bloody) -Patient was Pan-Cultured (Blood, Urine, CXR) and started on Empiric Zosyn -Repeat Urine Cx 07/19/17 showed No Growth  -Abscess Gram Stain showed Abundant WBC's, Abundant GNR, Abundant Gram + Cocci and Gram + Rods and pending Cx showed Multiple species present non-predominant -Blood Cx show NGTD at 3 Days  -Abdominal CT Scan repeated ordered and showed significant interval resolution of perihepatic fluid collection s/p catheter drainage and very small amount of residual fluid and air remains -C/w IV Zosyn with Pharmacy to Dose -General Surgery following and recommending Observation and NPO as patient is a Cirrhotic and likely not a good Surgical Candidate   Acute Encephalopathy likely Infectious Etiology, improved -Received 1 mg of Ativan 10/23 for CIWA but doubt his Encephalopathy is explained by that -Transferred to SDU for Closer Evaluation  -Ordered Head CT Scan w/o Contrast and showed no Acute Finding by CT but showed atrophy and chronic small-vessel ischemich cangeds -Bicarb gtt (150 mEQ) at 50 mL/hr (decreased by Nephro) now stopped  -Checked Ammonia Level and decreased from 52 -> 29 -RPR was Non-Reactive  -If not improving will order MRI and EEG and discuss with Neurology but likely this is Infectious    Coronary Artery Disease -Stable -Continue Aspirin and Beta Blocker -Not on  Statin  Gout -Stable -Continue Allopurinol  Hypertension -Blood pressure low normal -Hold parameter for Metoprolol and continue at 12.5 mg po Daily   Leg Ulcers on Right Leg  -possible polyarteritis nodosa, improved with antibiotics and steroids -Good pedal pulse -ABI wnl -Appreciate wound care:  Santyl with dry dressings daily  Leukocytosis likely from Intra-abdominal Abscess and Duodenal Leak -Less Likely from Steroids -Started Patient on Emprici Zosyn and improved  -Pan-Culture and repeat Abdominal CT Scan as above  -WBC went from 22.4 -> 18.7 -> 13.1 -> 8.7 -> 8.0 -> 6.1 -Repeat CBC in AM  Anemia of inflammation and possible renal disease and slow GI losses.  -Hemoglobin continued to decline which may be hemodilutional but also consider blood loss around liver? -Iron studies suggest inflammation, B12 HIGH, folate wnl, SPEP pending -TSH 1.76 -Occult stool POSITIVE -C/w Ferrous Sulfate 325 mg po BID - Nephrology ordering Hemolysis Labs -Hb/Hct went from 7.2/21.2 -> 8.8/26.0 -> 7.4/21.4 -> 7.7/22.4 -> 7.2/21.4 -> 9.1/27.2 -> 8.0/23.9 -S/p 1 unit of pRBC's  -Continue to Monitor for S/Sx of Bleeding and Repeat CBC in AM   Hx of EtOH Abuse/Concern for Withdrawal -Patient's family states he drinks heavily -Placed on CIWA and given 1 mg of Ativan last night   Hyperammonemia, improved -Patient's Ammonia Level 52 and improved to 29 -Repeat Ammonia Level in AM and await  Repeat CT Scan findings prior to administration of Lactulose   Liver Cirrhosis -Continue to Monitor LFT's; AST is 15 and ALT is 12 -Repeat CMP in AM   DVT prophylaxis: Heparin 5,000 sq  q8h Code Status: DO NOT RESUSCITATE  Family Communication: Discussed with Wife and Daughter at beside Disposition Plan: Remain in SDU; SNF when medically stable   Consultants:   Nephrology  General Surgery  Interventional Radiology   Procedures:  S/p CT DRAIN RT ABDOMEN   Antimicrobials: Anti-infectives     Start     Dose/Rate Route Frequency Ordered Stop   07/18/17 1300  piperacillin-tazobactam (ZOSYN) IVPB 2.25 g     2.25 g 100 mL/hr over 30 Minutes Intravenous Every 6 hours 07/18/17 1245       Subjective: Seen and examined and was improved and knew he was in [facility] but was still confused. No CP or SOB. No nausea or vomiting. Wife wanted to know when he could eat. She also stated that he did not sleep at all last night.   Objective: Vitals:   07/21/17 0000 07/21/17 0100 07/21/17 0300 07/21/17 0714  BP: (!) 170/86 (!) 153/72  135/78  Pulse: 88 85  70  Resp: 16 11  16   Temp:   98.9 F (37.2 C) (!) 97.3 F (36.3 C)  TempSrc:   Axillary Oral  SpO2: 96% 97%  97%  Weight:      Height:        Intake/Output Summary (Last 24 hours) at 07/21/17 0850 Last data filed at 07/21/17 0500  Gross per 24 hour  Intake              645 ml  Output             1190 ml  Net             -545 ml   Filed Weights   07/15/17 1707 07/15/17 2300  Weight: 71.7 kg (158 lb) 70.9 kg (156 lb 4.9 oz)   Examination: Physical Exam:  Constitutional: WN/WD Caucasian male in NAD who is calm and more alert Eyes: Sclerae anicteric. Lids appear normal. ENMT: External ears and nose appear normal Neck: Supple with no JVD Respiratory: Diminished to auscultation but no appreciable wheezing/rales/rhonchi and has unlabored breathing. Not wearing any supplemental O2 via [place]  Cardiovascular: RRR; 2/6 Systolic Murmur. 1+ LE edema Abdomen: Soft. Mildly tender to palpate. Bowel sounds present has Right sided abdominal drain with severe leak and green colored discharge GU: Deferred. Has foley catheter in draining dark colored urine Musculoskeletal: No contractures; No cyanosis Skin: Warm and dry. Has 1+ LE edema. Right Leg ulcerations present and are covered. Left leg no longer has mepilex pads and covers and no appreciable ulcers on Left Leg Neurologic: CN 2-12 grossly intact. No appreciable focal  deficits Psychiatric: Pleasant mood and affect. Awake and Alert x 2. Still slightly confused and not back to baseline  Data Reviewed: I have personally reviewed following labs and imaging studies  CBC:  Recent Labs Lab 07/18/17 1330 07/19/17 0548 07/19/17 1147 07/20/17 0400 07/20/17 1911 07/21/17 0453  WBC 13.1* 10.4 8.7 8.0  --  6.1  NEUTROABS 10.5* 7.8* 6.5 6.0  --  4.4  HGB 8.8* 7.4* 7.7* 7.2* 9.1* 8.0*  HCT 26.0* 21.4* 22.4* 21.4* 27.2* 23.9*  MCV 89.3 87.3 88.2 89.5  --  90.2  PLT 368 283 249 250  --  183   Basic Metabolic Panel:  Recent Labs Lab 07/17/17 0425  07/18/17 1330 07/19/17 0548 07/20/17 0400 07/20/17 1911 07/21/17 0453  NA 137 141 144 147* 145 147*  146*  K 5.5* 4.8 3.4* 2.9* 3.2* 2.9*  2.9*  CL 112* 109 103 104 104 105  103  CO2 12* 14* 19* 24 25 25  24   GLUCOSE 82 84 95 97 94 92  91  BUN 91* 101* 98* 90* 83* 79*  80*  CREATININE 3.97* 4.29* 4.35* 4.25* 4.04* 4.08*  4.09*  CALCIUM 8.5* 8.8* 8.2* 8.3* 8.3* 8.4*  8.3*  MG  --  2.0 1.7 1.6*  --  1.8  PHOS 7.5* 6.4* 5.8* 5.3*  --  5.2*  5.3*   GFR: Estimated Creatinine Clearance: 16.6 mL/min (A) (by C-G formula based on SCr of 4.09 mg/dL (H)). Liver Function Tests:  Recent Labs Lab 07/16/17 0423 07/17/17 0425 07/18/17 1330 07/19/17 0548 07/20/17 0400 07/21/17 0453  AST 19  --  25 19 17 15   ALT 15*  --  16* 15* 13* 12*  ALKPHOS 82  --  80 69 59 52  BILITOT 0.4  --  1.5* 1.6* 1.4* 1.2  PROT 5.3*  --  5.8* 4.9* 4.9* 4.9*  ALBUMIN 2.0* 1.9* 2.0* 1.6* 1.4* 1.4*  1.3*    Recent Labs Lab 07/15/17 1833  LIPASE 41    Recent Labs Lab 07/18/17 1640 07/19/17 0548  AMMONIA 52* 29   Coagulation Profile:  Recent Labs Lab 07/17/17 1041 07/18/17 1640  INR 1.21 1.21   Cardiac Enzymes:  Recent Labs Lab 07/15/17 1833  TROPONINI <0.03   BNP (last 3 results) No results for input(s): PROBNP in the last 8760 hours. HbA1C: No results for input(s): HGBA1C in the last 72  hours. CBG:  Recent Labs Lab 07/20/17 1135  GLUCAP 102*   Lipid Profile: No results for input(s): CHOL, HDL, LDLCALC, TRIG, CHOLHDL, LDLDIRECT in the last 72 hours. Thyroid Function Tests: No results for input(s): TSH, T4TOTAL, FREET4, T3FREE, THYROIDAB in the last 72 hours. Anemia Panel: No results for input(s): VITAMINB12, FOLATE, FERRITIN, TIBC, IRON, RETICCTPCT in the last 72 hours. Sepsis Labs: No results for input(s): PROCALCITON, LATICACIDVEN in the last 168 hours.  Recent Results (from the past 240 hour(s))  Aerobic/Anaerobic Culture (surgical/deep wound)     Status: None (Preliminary result)   Collection Time: 07/18/17 11:42 AM  Result Value Ref Range Status   Specimen Description ABDOMEN  Final   Special Requests Normal  Final   Gram Stain   Final    ABUNDANT WBC PRESENT, PREDOMINANTLY PMN ABUNDANT GRAM NEGATIVE RODS ABUNDANT GRAM POSITIVE COCCI MODERATE GRAM POSITIVE RODS    Culture   Final    CULTURE REINCUBATED FOR BETTER GROWTH HOLDING FOR POSSIBLE ANAEROBE    Report Status PENDING  Incomplete  Culture, Urine     Status: Abnormal   Collection Time: 07/18/17 12:11 PM  Result Value Ref Range Status   Specimen Description URINE, RANDOM  Final   Special Requests NONE  Final   Culture MULTIPLE SPECIES PRESENT, SUGGEST RECOLLECTION (A)  Final   Report Status 07/19/2017 FINAL  Final  Culture, blood (routine x 2)     Status: None (Preliminary result)   Collection Time: 07/18/17  1:30 PM  Result Value Ref Range Status   Specimen Description BLOOD LEFT HAND  Final   Special Requests IN PEDIATRIC BOTTLE Blood Culture adequate volume  Final   Culture NO GROWTH 3 DAYS  Final   Report Status PENDING  Incomplete  Culture, blood (routine x 2)  Status: None (Preliminary result)   Collection Time: 07/18/17  1:30 PM  Result Value Ref Range Status   Specimen Description BLOOD RIGHT HAND  Final   Special Requests IN PEDIATRIC BOTTLE Blood Culture adequate volume  Final    Culture NO GROWTH 3 DAYS  Final   Report Status PENDING  Incomplete  MRSA PCR Screening     Status: None   Collection Time: 07/18/17  7:10 PM  Result Value Ref Range Status   MRSA by PCR NEGATIVE NEGATIVE Final    Comment:        The GeneXpert MRSA Assay (FDA approved for NASAL specimens only), is one component of a comprehensive MRSA colonization surveillance program. It is not intended to diagnose MRSA infection nor to guide or monitor treatment for MRSA infections.   Culture, Urine     Status: None   Collection Time: 07/19/17  1:20 PM  Result Value Ref Range Status   Specimen Description URINE, CATHETERIZED  Final   Special Requests NONE  Final   Culture NO GROWTH  Final   Report Status 07/20/2017 FINAL  Final    Radiology Studies: No results found. Scheduled Meds: . allopurinol  300 mg Oral QODAY  . aspirin EC  81 mg Oral Daily  . chlorhexidine  15 mL Mouth Rinse BID  . collagenase   Topical Daily  . heparin  5,000 Units Subcutaneous Q8H  . mouth rinse  15 mL Mouth Rinse q12n4p  . metoprolol succinate  12.5 mg Oral Daily  . multivitamin with minerals  1 tablet Oral Daily  . pantoprazole (PROTONIX) IV  40 mg Intravenous Q24H  . sodium chloride flush  5 mL Intravenous Q8H  . thiamine  100 mg Oral Daily   Or  . thiamine  100 mg Intravenous Daily   Continuous Infusions: . piperacillin-tazobactam (ZOSYN)  IV Stopped (07/21/17 0510)  . potassium chloride      LOS: 6 days   Merlene Laughtermair Latif Sheikh, DO [facility] Pager [phone]  If 7PM-7AM, please contact night-coverage [email or web address] Password [email or web address] 07/21/2017, 8:50 AM

## 2017-07-21 NOTE — Progress Notes (Signed)
Patient ID: Jonathan Walker, male   DOB: 1945-11-12, 71 y.o.   MRN: [id number] [facility] Progress Note:   * No surgery found *  Subjective: Mental status is mildly confused Objective: Vital signs in last 24 hours: Temp:  [97.3 F (36.3 C)-98.9 F (37.2 C)] 97.3 F (36.3 C) (10/27 0714) Pulse Rate:  [68-88] 70 (10/27 0714) Resp:  [9-16] 16 (10/27 0714) BP: (134-170)/(59-86) 135/78 (10/27 0714) SpO2:  [96 %-99 %] 97 % (10/27 0714)  Intake/Output from previous day: 10/26 0701 - 10/27 0700 In: 645 [I.V.:10; Blood:435; IV Piggyback:200] Out: 1440 [Urine:1125; Drains:315] Intake/Output this shift: No intake/output data recorded.  Physical Exam: Work of breathing is not labored.  Green drainage from duodenal leak.    Lab Results:  Results for orders placed or performed during the hospital encounter of 07/15/17 (from the past 48 hour(s))  Culture, Urine     Status: None   Collection Time: 07/19/17  1:20 PM  Result Value Ref Range   Specimen Description URINE, CATHETERIZED    Special Requests NONE    Culture NO GROWTH    Report Status 07/20/2017 FINAL   CBC with Differential/Platelet     Status: Abnormal   Collection Time: 07/20/17  4:00 AM  Result Value Ref Range   WBC 8.0 4.0 - 10.5 K/uL   RBC 2.39 (L) 4.22 - 5.81 MIL/uL   Hemoglobin 7.2 (L) 13.0 - 17.0 g/dL   HCT 21.4 (L) 39.0 - 52.0 %   MCV 89.5 78.0 - 100.0 fL   MCH 30.1 26.0 - 34.0 pg   MCHC 33.6 30.0 - 36.0 g/dL   RDW 12.7 11.5 - 15.5 %   Platelets 250 150 - 400 K/uL   Neutrophils Relative % 75 %   Neutro Abs 6.0 1.7 - 7.7 K/uL   Lymphocytes Relative 13 %   Lymphs Abs 1.0 0.7 - 4.0 K/uL   Monocytes Relative 12 %   Monocytes Absolute 1.0 0.1 - 1.0 K/uL   Eosinophils Relative 0 %   Eosinophils Absolute 0.0 0.0 - 0.7 K/uL   Basophils Relative 0 %   Basophils Absolute 0.0 0.0 - 0.1 K/uL  Comprehensive metabolic panel     Status: Abnormal   Collection Time: 07/20/17  4:00 AM  Result Value Ref Range   Sodium  147 (H) 135 - 145 mmol/L   Potassium 2.9 (L) 3.5 - 5.1 mmol/L   Chloride 104 101 - 111 mmol/L   CO2 24 22 - 32 mmol/L   Glucose, Bld 97 65 - 99 mg/dL   BUN 90 (H) 6 - 20 mg/dL   Creatinine, Ser 4.25 (H) 0.61 - 1.24 mg/dL   Calcium 8.3 (L) 8.9 - 10.3 mg/dL   Total Protein 4.9 (L) 6.5 - 8.1 g/dL   Albumin 1.4 (L) 3.5 - 5.0 g/dL   AST 17 15 - 41 U/L   ALT 13 (L) 17 - 63 U/L   Alkaline Phosphatase 59 38 - 126 U/L   Total Bilirubin 1.4 (H) 0.3 - 1.2 mg/dL   GFR calc non Af Amer 13 (L) >60 mL/min   GFR calc Af Amer 15 (L) >60 mL/min    Comment: (NOTE) The eGFR has been calculated using the CKD EPI equation. This calculation has not been validated in all clinical situations. eGFR's persistently <60 mL/min signify possible Chronic Kidney Disease.    Anion gap 19 (H) 5 - 15  Magnesium     Status: Abnormal   Collection Time: 07/20/17  4:00 AM  Result Value Ref Range   Magnesium 1.6 (L) 1.7 - 2.4 mg/dL  Phosphorus     Status: Abnormal   Collection Time: 07/20/17  4:00 AM  Result Value Ref Range   Phosphorus 5.3 (H) 2.5 - 4.6 mg/dL  Prepare RBC     Status: None   Collection Time: 07/20/17 10:04 AM  Result Value Ref Range   Order Confirmation ORDER PROCESSED BY BLOOD BANK   Type and screen [place]     Status: None   Collection Time: 07/20/17 10:20 AM  Result Value Ref Range   ABO/RH(D) O POS    Antibody Screen NEG    Sample Expiration 07/23/2017    Unit Number [id number]    Blood Component Type RED CELLS,LR    Unit division 00    Status of Unit ISSUED,FINAL    Transfusion Status OK TO TRANSFUSE    Crossmatch Result Compatible   ABO/Rh     Status: None   Collection Time: 07/20/17 10:20 AM  Result Value Ref Range   ABO/RH(D) O POS   Glucose, capillary     Status: Abnormal   Collection Time: 07/20/17 11:35 AM  Result Value Ref Range   Glucose-Capillary 102 (H) 65 - 99 mg/dL  Basic metabolic panel     Status: Abnormal   Collection Time: 07/20/17  7:11 PM   Result Value Ref Range   Sodium 145 135 - 145 mmol/L   Potassium 3.2 (L) 3.5 - 5.1 mmol/L   Chloride 104 101 - 111 mmol/L   CO2 25 22 - 32 mmol/L   Glucose, Bld 94 65 - 99 mg/dL   BUN 83 (H) 6 - 20 mg/dL   Creatinine, Ser 4.04 (H) 0.61 - 1.24 mg/dL   Calcium 8.3 (L) 8.9 - 10.3 mg/dL   GFR calc non Af Amer 14 (L) >60 mL/min   GFR calc Af Amer 16 (L) >60 mL/min    Comment: (NOTE) The eGFR has been calculated using the CKD EPI equation. This calculation has not been validated in all clinical situations. eGFR's persistently <60 mL/min signify possible Chronic Kidney Disease.    Anion gap 16 (H) 5 - 15  Hemoglobin and hematocrit, blood     Status: Abnormal   Collection Time: 07/20/17  7:11 PM  Result Value Ref Range   Hemoglobin 9.1 (L) 13.0 - 17.0 g/dL    Comment: REPEATED TO VERIFY POST TRANSFUSION SPECIMEN DELTA CHECK NOTED    HCT 27.2 (L) 39.0 - 52.0 %  Renal function panel     Status: Abnormal   Collection Time: 07/21/17  4:53 AM  Result Value Ref Range   Sodium 146 (H) 135 - 145 mmol/L   Potassium 2.9 (L) 3.5 - 5.1 mmol/L   Chloride 103 101 - 111 mmol/L   CO2 24 22 - 32 mmol/L   Glucose, Bld 91 65 - 99 mg/dL   BUN 80 (H) 6 - 20 mg/dL   Creatinine, Ser 4.09 (H) 0.61 - 1.24 mg/dL   Calcium 8.3 (L) 8.9 - 10.3 mg/dL   Phosphorus 5.3 (H) 2.5 - 4.6 mg/dL   Albumin 1.3 (L) 3.5 - 5.0 g/dL   GFR calc non Af Amer 13 (L) >60 mL/min   GFR calc Af Amer 16 (L) >60 mL/min    Comment: (NOTE) The eGFR has been calculated using the CKD EPI equation. This calculation has not been validated in all clinical situations. eGFR's persistently <60 mL/min signify possible Chronic Kidney Disease.  Anion gap 19 (H) 5 - 15  CBC with Differential/Platelet     Status: Abnormal   Collection Time: 07/21/17  4:53 AM  Result Value Ref Range   WBC 6.1 4.0 - 10.5 K/uL   RBC 2.65 (L) 4.22 - 5.81 MIL/uL   Hemoglobin 8.0 (L) 13.0 - 17.0 g/dL   HCT 23.9 (L) 39.0 - 52.0 %   MCV 90.2 78.0 - 100.0 fL    MCH 30.2 26.0 - 34.0 pg   MCHC 33.5 30.0 - 36.0 g/dL   RDW 14.1 11.5 - 15.5 %   Platelets 183 150 - 400 K/uL   Neutrophils Relative % 73 %   Neutro Abs 4.4 1.7 - 7.7 K/uL   Lymphocytes Relative 17 %   Lymphs Abs 1.1 0.7 - 4.0 K/uL   Monocytes Relative 10 %   Monocytes Absolute 0.6 0.1 - 1.0 K/uL   Eosinophils Relative 0 %   Eosinophils Absolute 0.0 0.0 - 0.7 K/uL   Basophils Relative 0 %   Basophils Absolute 0.0 0.0 - 0.1 K/uL  Comprehensive metabolic panel     Status: Abnormal   Collection Time: 07/21/17  4:53 AM  Result Value Ref Range   Sodium 147 (H) 135 - 145 mmol/L   Potassium 2.9 (L) 3.5 - 5.1 mmol/L   Chloride 105 101 - 111 mmol/L   CO2 25 22 - 32 mmol/L   Glucose, Bld 92 65 - 99 mg/dL   BUN 79 (H) 6 - 20 mg/dL   Creatinine, Ser 4.08 (H) 0.61 - 1.24 mg/dL   Calcium 8.4 (L) 8.9 - 10.3 mg/dL   Total Protein 4.9 (L) 6.5 - 8.1 g/dL   Albumin 1.4 (L) 3.5 - 5.0 g/dL   AST 15 15 - 41 U/L   ALT 12 (L) 17 - 63 U/L   Alkaline Phosphatase 52 38 - 126 U/L   Total Bilirubin 1.2 0.3 - 1.2 mg/dL   GFR calc non Af Amer 13 (L) >60 mL/min   GFR calc Af Amer 16 (L) >60 mL/min    Comment: (NOTE) The eGFR has been calculated using the CKD EPI equation. This calculation has not been validated in all clinical situations. eGFR's persistently <60 mL/min signify possible Chronic Kidney Disease.    Anion gap 17 (H) 5 - 15  Magnesium     Status: None   Collection Time: 07/21/17  4:53 AM  Result Value Ref Range   Magnesium 1.8 1.7 - 2.4 mg/dL  Phosphorus     Status: Abnormal   Collection Time: 07/21/17  4:53 AM  Result Value Ref Range   Phosphorus 5.2 (H) 2.5 - 4.6 mg/dL    Radiology/Results: No results found.  Anti-infectives: Anti-infectives    Start     Dose/Rate Route Frequency Ordered Stop   07/18/17 1300  piperacillin-tazobactam (ZOSYN) IVPB 2.25 g     2.25 g 100 mL/hr over 30 Minutes Intravenous Every 6 hours 07/18/17 1245        Assessment/Plan: Problem  List: Patient Active Problem List   Diagnosis Date Noted  . Hypernatremia 07/20/2017  . Hypokalemia 07/20/2017  . Hypomagnesemia 07/20/2017  . Hyperphosphatemia 07/20/2017  . Intra-abdominal abscess ([place]) 07/18/2017  . Acute encephalopathy 07/18/2017  . CAD (coronary artery disease) 07/18/2017  . Gout 07/18/2017  . Essential hypertension 07/18/2017  . Ulcers of both lower legs ([place]) 07/18/2017  . Hyperammonemia ([place]) 07/18/2017  . History of alcoholism ([place]) 07/18/2017  . 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 Normocytic anemia 07/16/2017  .  AKI (acute kidney injury) ([facility]) 07/15/2017  . Gouty arthritis 01/23/2014  . Acute gouty arthritis 01/16/2014  . Pain in lower limb 11/14/2013  . Other hammer toe (acquired) 08/01/2013  . Onychomycosis due to dermatophyte 01/28/2013  . Pain in joint, ankle and foot 01/28/2013    Perforated duodenum in cirrhotic-drained percutaneously.  Observation.   * No surgery found *    LOS: 6 days   Matt B. , MD, FACS  [facility] [phone] beeper [phone]  07/21/2017 11:54 AM 

## 2017-07-21 NOTE — Progress Notes (Signed)
Referring Physician(s): Dr Elwyn Lade  Supervising Physician: Oley Balm  Patient Status:  [facility] - In-pt  Chief Complaint:  10/24: S/p CT DRAIN RT ABDOMEN  Subjective:  copious greenish output still Draining out of site also Changing dressing 2x/day Tender site   Allergies: Patient has no known allergies.  Medications: Prior to Admission medications   Medication Sig Start Date End Date Taking? Authorizing Provider  allopurinol (ZYLOPRIM) 300 MG tablet Take 300 mg by mouth daily.   Yes [provider]  aspirin EC 81 MG tablet Take 81 mg by mouth daily.   Yes [provider]  cephALEXin (KEFLEX) 500 MG capsule Take 500 mg by mouth 3 (three) times daily.   Yes [provider]  Cholecalciferol (VITAMIN D3) 2000 units TABS Take 1 tablet by mouth 2 (two) times daily.   Yes [provider]  Ferrous Sulfate (IRON) 325 (65 Fe) MG TABS Take 1 tablet by mouth 2 (two) times daily.    Yes [provider]  furosemide (LASIX) 20 MG tablet Take 20 mg by mouth daily.    Yes [provider]  metoprolol succinate (TOPROL-XL) 25 MG 24 hr tablet Take 12.5 mg by mouth every other day.    Yes [provider]  omeprazole (PRILOSEC) 20 MG capsule Take 20 mg by mouth daily.   Yes [provider]  potassium chloride SA (K-DUR,KLOR-CON) 20 MEQ tablet Take 20 mEq by mouth daily.   Yes [provider]  PREDNISONE PO Take 1 tablet by mouth daily.   Yes [provider]     Vital Signs: BP (!) 152/75 (BP Location: Right Arm)   Pulse 61   Temp 97.7 F (36.5 C) (Oral)   Resp 11   Ht 5\' 10"  (1.778 m)   Wt 156 lb 4.9 oz (70.9 kg)   SpO2 96%   BMI 22.43 kg/m   Physical Exam  Abdominal: Soft. There is tenderness.  Musculoskeletal: Normal range of motion.  Neurological:  confused  Skin: Skin is warm and dry.  Site of drain is clean and dry Thal tubing smaller tham skin site and drainage coming out  around it Drainage is green fluid ++ odorous 50 cc in bag 315 cc yesterday  Nursing note and vitals reviewed.   Imaging: Ct Abdomen Pelvis Wo Contrast  Result Date: 07/18/2017 CLINICAL DATA:  71 year old male with recent drainage of right abdominal abscess. Follow-up study. EXAM: CT ABDOMEN AND PELVIS WITHOUT CONTRAST TECHNIQUE: Multidetector CT imaging of the abdomen and pelvis was performed following the standard protocol without IV contrast. COMPARISON:  Abdominal CT dated 07/16/2017 and 07/18/2017 FINDINGS: Evaluation of this exam is limited in the absence of intravenous contrast. Evaluation is also limited due to respiratory motion artifact as well as streak artifact caused by patient's arms. Lower chest: Partially visualized small right pleural effusion with associated mild compressive atelectasis of the right lung base. Left lung base linear atelectasis/ scarring. There is mild cardiomegaly. Multi vessel coronary vascular calcification as well as calcification of the mitral annulus. There is hypoattenuation of the cardiac blood pool suggestive of a degree of anemia. Clinical correlation is recommended. Hepatobiliary: There is mild surface nodularity of the liver concerning for early changes of cirrhosis. Clinical correlation is recommended. No intrahepatic biliary ductal dilatation. The gallbladder is distended. There is a 1.8 cm stone within the gallbladder. No pericholecystic fluid. Pancreas: Unremarkable. No pancreatic ductal dilatation or surrounding inflammatory changes. Spleen: Normal in size without focal abnormality. Adrenals/Urinary Tract:  The adrenal glands are unremarkable. There is no hydronephrosis or nephrolithiasis on either side. The visualized ureters appear grossly unremarkable. The urinary bladder is decompressed around a Foley catheter. There is apparent diffuse thickening of the bladder wall which may be partly related to underdistention. Cystitis is not excluded. Correlation  with urinalysis recommended. Stomach/Bowel: There is extensive sigmoid diverticulosis without active inflammatory changes. There is no evidence of bowel obstruction or active inflammation. The appendix is normal. 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 The abdominal aorta and IVC are otherwise grossly unremarkable on this noncontrast study. No portal venous gas identified. There is no adenopathy. Reproductive: The prostate and seminal vesicles are grossly unremarkable. Other: There has been significant interval decrease in the site of the perihepatic fluid collection compared to the prior studies. A right subcostal approach drainage catheter is seen with tip along the lateral capsule of the right lobe of the liver. Minimal amount of residual fluid and air remains. Small amount of free fluid is also seen within the pelvis. Musculoskeletal: There is diffuse subcutaneous edema and anasarca. No drainable fluid collection. Degenerative changes of the spine and median sternotomy wires. No acute osseous pathology. IMPRESSION: 1. Significant interval resolution of the previously seen perihepatic fluid collection status post drainage catheter placement. The tip of the drainage catheter is positioned along the lateral aspect of the right lobe of the liver superiorly. Very small amount of residual fluid and air remains. 2. Irregular hepatic contour likely changes of cirrhosis. 3. Extensive sigmoid diverticulosis.  No active inflammation. 4. Gallstone. Electronically Signed   By: Elgie CollardArash  Radparvar M.D.   On: 07/18/2017 20:28   Ct Head Wo Contrast  Result Date: 07/18/2017 CLINICAL DATA:  Altered level of consciousness, unexplained. EXAM: CT HEAD WITHOUT CONTRAST TECHNIQUE: Contiguous axial images were obtained from the base of the skull through the vertex without intravenous contrast. COMPARISON:  09/08/2005 FINDINGS: Brain: No acute finding by CT. Generalized brain atrophy. Chronic  small-vessel ischemic changes throughout the hemispheric white matter, basal ganglia and thalami I. No mass lesion, hemorrhage, hydrocephalus or extra-axial collection. Vascular: There is atherosclerotic calcification of the major vessels at the base of the brain. Skull: Negative Sinuses/Orbits: Clear/normal Other: None IMPRESSION: No acute finding by CT. Atrophy and chronic small-vessel ischemic changes. Electronically Signed   By: Paulina FusiMark  Shogry M.D.   On: 07/18/2017 15:55   Dg Chest Port 1 View  Result Date: 07/18/2017 CLINICAL DATA:  Status post placement of a perihepatic drain for abscess today. EXAM: PORTABLE CHEST 1 VIEW COMPARISON:  CT abdomen and pelvis 07/16/2017 and images from drain placement today. PA and lateral chest 07/15/2017. FINDINGS: Drainage catheter is identified in the right upper quadrant of the abdomen. There is mild elevation the right hemidiaphragm relative to the left, unchanged. Lungs are clear. No pneumothorax or pleural effusion. Patient status post median sternotomy. Heart size is upper normal. IMPRESSION: No acute cardiopulmonary disease. Drainage catheter in the right upper quadrant of the abdomen. Electronically Signed   By: Drusilla Kannerhomas  Dalessio M.D.   On: 07/18/2017 14:10   Ct Image Guided Drainage By Percutaneous Catheter  Result Date: 07/18/2017 INDICATION: Large right intra-abdominal abscess EXAM: CT DRAINAGE RIGHT INTRA-ABDOMINAL ABSCESS MEDICATIONS: The patient is currently admitted to the hospital and receiving intravenous antibiotics. The antibiotics were administered within an appropriate time frame prior to the initiation of the procedure. ANESTHESIA/SEDATION: Moderate Sedation Time:  None. The patient was continuously monitored during the procedure by the interventional radiology nurse under my direct supervision. COMPLICATIONS: None  immediate. PROCEDURE: Informed written consent was obtained from the patient and his family after a thorough discussion of the  procedural risks, benefits and alternatives. All questions were addressed. Maximal Sterile Barrier Technique was utilized including caps, mask, sterile gowns, sterile gloves, sterile drape, hand hygiene and skin antiseptic. A timeout was performed prior to the initiation of the procedure. Previous imaging reviewed. Patient positioned supine. Noncontrast localization CT performed to localize the right abdominal large abscess. Overlying skin marked in the right mid abdomen. Under sterile conditions and local anesthesia, an 18 gauge 10 cm access needle was advanced percutaneously into the large fluid collection. Needle position confirmed with CT. Amplatz guidewire inserted followed by tract dilatation to insert a 16 French abscess drain. Drain catheter position confirmed with CT. Syringe aspiration yielded 1100 cc purulent fluid compatible with abscess. Sample sent for Gram stain and culture. Catheter secured with a Prolene suture and connected to external gravity drainage bag. Patient tolerated the procedure well. No immediate complication. IMPRESSION: Successful CT-guided right abdominal abscess drain catheter insertion. Electronically Signed   By: Judie Petit.  Shick M.D.   On: 07/18/2017 12:52    Labs:  CBC:  Recent Labs  07/19/17 0548 07/19/17 1147 07/20/17 0400 07/20/17 1911 07/21/17 0453  WBC 10.4 8.7 8.0  --  6.1  HGB 7.4* 7.7* 7.2* 9.1* 8.0*  HCT 21.4* 22.4* 21.4* 27.2* 23.9*  PLT 283 249 250  --  183    COAGS:  Recent Labs  07/17/17 1041 07/18/17 1640  INR 1.21 1.21  APTT  --  33    BMP:  Recent Labs  07/19/17 0548 07/20/17 0400 07/20/17 1911 07/21/17 0453  NA 144 147* 145 147*  146*  K 3.4* 2.9* 3.2* 2.9*  2.9*  CL 103 104 104 105  103  CO2 19* 24 25 25  24   GLUCOSE 95 97 94 92  91  BUN 98* 90* 83* 79*  80*  CALCIUM 8.2* 8.3* 8.3* 8.4*  8.3*  CREATININE 4.35* 4.25* 4.04* 4.08*  4.09*  GFRNONAA 12* 13* 14* 13*  13*  GFRAA 14* 15* 16* 16*  16*    LIVER FUNCTION  TESTS:  Recent Labs  07/18/17 1330 07/19/17 0548 07/20/17 0400 07/21/17 0453  BILITOT 1.5* 1.6* 1.4* 1.2  AST 25 19 17 15   ALT 16* 15* 13* 12*  ALKPHOS 80 69 59 52  PROT 5.8* 4.9* 4.9* 4.9*  ALBUMIN 2.0* 1.6* 1.4* 1.4*  1.3*    Assessment and Plan:  R abd abscess drain intact Large amount of output and from drainage site Discussed with Dr Deanne Coffer Needs drain injection Monday----ordered  Electronically Signed: Ralene Muskrat A, PA-C 07/21/2017, 2:29 PM   I spent a total of 25 Minutes at the the patient's bedside AND on the patient's hospital floor or unit, greater than 50% of which was counseling/coordinating care for R abd abscess drain

## 2017-07-21 NOTE — Progress Notes (Signed)
Significant leakage noted around drain insertion site. The leakage appears to brown and "globby". Multiple RNs discussed and concluded that drainage resembles stool. A hardened area can be palpated around the drain insertion site. Patient states that the area is tender to the touch with a pain number of 9/10. Vitals signs have remained WDL. Selena BattenKim, MD, with [facility], Janee Mornhompson, MD with surgery, and Deanne CofferHassell, MD with IR were all notified. We discussed having the PA round on the patient first thing in the morning. Will continue to monitor.

## 2017-07-22 ENCOUNTER — Inpatient Hospital Stay (HOSPITAL_COMMUNITY): Payer: Medicare Other

## 2017-07-22 DIAGNOSIS — R0602 Shortness of breath: Secondary | ICD-10-CM

## 2017-07-22 LAB — COMPREHENSIVE METABOLIC PANEL
ALBUMIN: 1.5 g/dL — AB (ref 3.5–5.0)
ALK PHOS: 53 U/L (ref 38–126)
ALT: 12 U/L — ABNORMAL LOW (ref 17–63)
AST: 15 U/L (ref 15–41)
Anion gap: 18 — ABNORMAL HIGH (ref 5–15)
BILIRUBIN TOTAL: 1.3 mg/dL — AB (ref 0.3–1.2)
BUN: 75 mg/dL — AB (ref 6–20)
CALCIUM: 8.6 mg/dL — AB (ref 8.9–10.3)
CO2: 23 mmol/L (ref 22–32)
CREATININE: 4.05 mg/dL — AB (ref 0.61–1.24)
Chloride: 105 mmol/L (ref 101–111)
GFR calc Af Amer: 16 mL/min — ABNORMAL LOW (ref >60)
GFR, EST NON AFRICAN AMERICAN: 14 mL/min — AB (ref >60)
GLUCOSE: 77 mg/dL (ref 65–99)
Potassium: 3.5 mmol/L (ref 3.5–5.1)
Sodium: 146 mmol/L — ABNORMAL HIGH (ref 135–145)
TOTAL PROTEIN: 5.8 g/dL — AB (ref 6.5–8.1)

## 2017-07-22 LAB — CBC WITH DIFFERENTIAL/PLATELET
BASOS PCT: 0 %
Basophils Absolute: 0 10*3/uL (ref 0.0–0.1)
EOS PCT: 0 %
Eosinophils Absolute: 0 10*3/uL (ref 0.0–0.7)
HEMATOCRIT: 28.9 % — AB (ref 39.0–52.0)
Hemoglobin: 9.2 g/dL — ABNORMAL LOW (ref 13.0–17.0)
LYMPHS ABS: 1 10*3/uL (ref 0.7–4.0)
Lymphocytes Relative: 19 %
MCH: 29.6 pg (ref 26.0–34.0)
MCHC: 31.8 g/dL (ref 30.0–36.0)
MCV: 92.9 fL (ref 78.0–100.0)
MONO ABS: 0.8 10*3/uL (ref 0.1–1.0)
MONOS PCT: 14 %
NEUTROS PCT: 67 %
Neutro Abs: 3.7 10*3/uL (ref 1.7–7.7)
PLATELETS: 188 10*3/uL (ref 150–400)
RBC: 3.11 MIL/uL — ABNORMAL LOW (ref 4.22–5.81)
RDW: 14.5 % (ref 11.5–15.5)
WBC: 5.5 10*3/uL (ref 4.0–10.5)

## 2017-07-22 LAB — RENAL FUNCTION PANEL
ANION GAP: 20 — AB (ref 5–15)
Albumin: 1.5 g/dL — ABNORMAL LOW (ref 3.5–5.0)
BUN: 75 mg/dL — ABNORMAL HIGH (ref 6–20)
CO2: 22 mmol/L (ref 22–32)
Calcium: 8.5 mg/dL — ABNORMAL LOW (ref 8.9–10.3)
Chloride: 104 mmol/L (ref 101–111)
Creatinine, Ser: 4.03 mg/dL — ABNORMAL HIGH (ref 0.61–1.24)
GFR calc non Af Amer: 14 mL/min — ABNORMAL LOW (ref >60)
GFR, EST AFRICAN AMERICAN: 16 mL/min — AB (ref >60)
GLUCOSE: 78 mg/dL (ref 65–99)
Phosphorus: 5 mg/dL — ABNORMAL HIGH (ref 2.5–4.6)
Potassium: 3.4 mmol/L — ABNORMAL LOW (ref 3.5–5.1)
Sodium: 146 mmol/L — ABNORMAL HIGH (ref 135–145)

## 2017-07-22 LAB — PHOSPHORUS: Phosphorus: 4.9 mg/dL — ABNORMAL HIGH (ref 2.5–4.6)

## 2017-07-22 LAB — MAGNESIUM: Magnesium: 1.7 mg/dL (ref 1.7–2.4)

## 2017-07-22 MED ORDER — DEXTROSE-NACL 5-0.45 % IV SOLN
INTRAVENOUS | Status: DC
  Administered 2017-07-22: 50 mL/h via INTRAVENOUS
  Administered 2017-07-23 – 2017-07-24 (×2): via INTRAVENOUS

## 2017-07-22 MED ORDER — POTASSIUM CHLORIDE 10 MEQ/100ML IV SOLN
10.0000 meq | INTRAVENOUS | Status: AC
  Administered 2017-07-22 (×2): 10 meq via INTRAVENOUS
  Filled 2017-07-22 (×2): qty 100

## 2017-07-22 MED ORDER — HEPARIN SODIUM (PORCINE) 5000 UNIT/ML IJ SOLN: 5000.0000 [IU] | Freq: Three times a day (TID) | INTRAMUSCULAR | Status: DC

## 2017-07-22 MED ORDER — POTASSIUM CHLORIDE 10 MEQ/100ML IV SOLN: 10.0000 meq | INTRAVENOUS | Status: DC

## 2017-07-22 NOTE — Progress Notes (Signed)
PROGRESS NOTE    Jonathan Walker  ZOX:[id number] DOB: 11/12/1945 DOA: 07/15/2017 PCP: Samuel JesterButler, Cynthia, DO   Brief Narrative:  Patient is a 71 year old CAD s/p CABG, hypertension, hyperlipidemia, gout who resents with weakness and was found to have AKI.  His lasix was increased from every other day to daily dosing about 1 month ago.  For the last few days, however, he has not made very much urine output and he has become very fatigued.  Additionally, he was seen by dermatology, Dr. Margo AyeHall this month for some new right lower extremity ulcers.  They were biopsied and biopsy demonstrated no evidence of vasculitis, but sample was superficial and nonspecific.  He was diagnosed with polyarteritis nodosum and given keflex and prednisone taper which have helped his ulcers.  He had a weakly positive ANA and CANCA, both titers were 1:80 earlier this month. Creatinine has worsened and patient was transferred to [facility] and had a drain placed for an intra-abdominal abscess. Patient was more alert and interactive today but still remains slightly confused. Drain is leaking quite significantly and color changed to brown drainage and plan is for Drain Injection Monday 07/23/17. Patient's color of drainage was brown today resembling stool.   Assessment & Plan:   Active Problems:   AKI (acute kidney injury) (HCC)   Polyarteritis nodosa (HCC)   Normocytic anemia   Intra-abdominal abscess (HCC)   Acute encephalopathy   CAD (coronary artery disease)   Gout   Essential hypertension   Ulcers of both lower legs (HCC)   Hyperammonemia (HCC)   History of alcoholism (HCC)   Hypernatremia   Hypokalemia   Hypomagnesemia   Hyperphosphatemia  Acute kidney injury with microscopic Hematuria/Evidence of possible nephritis.   -Baseline creatinine is 0.9 on May 8th, 2018.  On and at that time his lasix dose was increased.   -Creatinine 1.38 on Oct 1st.  Possible this may all be due to ATN from dehydration/overdiuresis, but  autoimmune disorder also possible. Cr has worsened in the setting of Abdominal Infection/Abscess; Slowly improving and now Cr is 4.05 -Nephrology consultation appreciated -ANA has been repeated -Mpo/pr-3 ANCA Ab WNL  -Complement:  C3 70 (Low), CH 50 and C4 wnl -HCV negative and HBV negative -HIV Non-Reactive -UPEP being collected -Bicarb gtt 50 mL/hr by Nephro now stopped by Nephro and they are starting IVF as below  -Lasix for Hyperkalemia held by Nephrology  -Patiromer stopped Nephrology -Avoid Kayexelate in setting of occult positive stool and unusual abdominal fluid collection -Renal US:  Minimally complex bilateral renal cysts, unchanged from prior -May need renal biopsy but will hold off for now and if Cr still stays up will biopsy in a few weeks. 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  -C/w IV Zosyn  -Nephro now repeating BMP and starting patient on D5W 1/2 NS IVF at 50 mL/hr  Intra-abdominal Abscess s/p Drain by IR with Suspected Duodenal Leak/ Small Bowel Perforation   -Was originally suscpected infected ascites given suggestion of cirrhosis versus blood versus contained bowel perforation; Suspect likely from bowel or enteric source and now thinking it is a perforated duodenum -CT scan: Large fluid collection around the liver extending into the right lower quadrant with air fluid level -[facility] IR paracentesis and drain placement -IR drained 1.1 Liter of purulent discharge and drainage tube showed cloudy grey purulent discharge  -IR sending Fluid for Bilirubin and if elevated will proceed with HIDA Scan to  evaluate for Bile Leak; However unable to obtain Bilrubin Test on Abscess fluid per Lab; Per IR continue to Monitor Drain Output and because patient has Copious amount of Drainage with leaking. The color of drainage changed from green to brown and  will need Drain Injection and is  ordered for Monday 07/23/17 -WOC Nurse was consulted for Drainage Management and had Eakin Pouch and skin barrier placed -Ordered paracentesis labs with cytology and a hematocrit (in case it's bloody) -Patient was Pan-Cultured (Blood, Urine, CXR) and started on Empiric Zosyn; Will continue  -Repeat Urine Cx 07/19/17 showed No Growth and then 30,000 CFU of Yeast   -Abscess Gram Stain showed Abundant WBC's, Abundant GNR, Abundant Gram + Cocci and Gram + Rods and pending Cx showed Multiple species present non-predominant -Blood Cx show NGTD at 4 Days  -Abdominal CT Scan repeated ordered and showed significant interval resolution of perihepatic fluid collection s/p catheter drainage and very small amount of residual fluid and air remains -C/w IV Zosyn with Pharmacy to Dose -General Surgery following and recommending Observation and NPO as patient is a Cirrhotic and likely not a good Surgical Candidate   Hypernatremia -Mild at 146 -Bicarbonate gtt now stopped -Repeat BMP pending today -Started on D5W 1/2 NS IVF at 50 mL/hr -Repeat CMP in AM  Hypokalemia -Patient's K+ was 3.4 this AM -Replete with IV KCl 20 meQ  -Continue to Monitor and Replete as Necessary -Repeat CMP in AM  Hypomagnesemia -Patient's Mag Level 1.6 and improved to 1.7 -Replete with IV Mag Sulfate 1 gram yesterday -Continue to Monitor and Replete as Necessary -Repeat Mag Level in AM  Hyperphosphatemia -Patient's Phos Level was 5.8 and went to 5.0  -Likely to improve if Kidney Fxn starts improving -Repeat Phos Level in AM  Acute Encephalopathy likely Infectious Etiology, improved -Received 1 mg of Ativan 10/23 for CIWA but doubt his Encephalopathy is explained by that -Transferred to SDU for Closer Evaluation  -Ordered Head CT Scan w/o Contrast and showed no Acute Finding by CT but showed atrophy and chronic small-vessel ischemich cangeds -Bicarb gtt (150 mEQ) at 50 mL/hr (decreased by Nephro) now stopped  -Checked  Ammonia Level and decreased from 52 -> 29 -RPR was Non-Reactive  -If not improving will order MRI and EEG and discuss with Neurology but likely this is Infectious    Coronary Artery Disease -Stable -Continue Aspirin and Beta Blocker -Not on Statin  Gout -Stable -Continue Allopurinol  Hypertension -Blood pressure low normal -Hold parameter for Metoprolol and continue at 12.5 mg po Daily   Leg Ulcers on Right Leg  -possible polyarteritis nodosa, improved with antibiotics and steroids -Good pedal pulse -ABI wnl -Appreciate wound care: Santyl with dry dressings daily  Leukocytosis likely from Intra-abdominal Abscess and Duodenal Leak -Less Likely from Steroids -Started Patient on Emprici Zosyn and improved  -Pan-Culture and repeat Abdominal CT Scan as above  -WBC went from 22.4 -> 18.7 -> 13.1 -> 8.7 -> 8.0 -> 6.1 -> 5.5 -Repeat CBC in AM  Anemia of inflammation and possible renal disease and slow GI losses.  -Hemoglobin continued to decline which may be hemodilutional but also consider blood loss around liver? -Iron studies suggest inflammation, B12 HIGH, folate wnl, SPEP pending -TSH 1.76 -Occult stool POSITIVE -C/w Ferrous Sulfate 325 mg po BID - Nephrology ordering Hemolysis Labs -Hb/Hct went from 7.2/21.2 -> 8.8/26.0 -> 7.4/21.4 -> 7.7/22.4 -> 7.2/21.4 -> 9.1/27.2 -> 8.0/23.9 -> 9.2/28.9 -S/p 1 unit of pRBC's  -Continue to Monitor for S/Sx of  Bleeding and Repeat CBC in AM   Hx of EtOH Abuse/Concern for Withdrawal -Patient's family states he drinks heavily -Placed on CIWA and given 1 mg of Ativan last night   Hyperammonemia, improved -Patient's Ammonia Level 52 and improved to 29 -Repeat Ammonia Level in AM and await Repeat CT Scan findings prior to administration of Lactulose   Liver Cirrhosis -Continue to Monitor LFT's; AST is 15 and ALT is 12 -Repeat CMP in AM   DVT prophylaxis: Heparin 5,000 sq  q8h Code Status: DO NOT RESUSCITATE  Family  Communication: Discussed with Wife over the phone Disposition Plan: Remain in SDU; SNF when medically stable   Consultants:   Nephrology  General Surgery  Interventional Radiology   Procedures:  S/p CT DRAIN RT ABDOMEN  Drain Injection in AM   Antimicrobials: Anti-infectives    Start     Dose/Rate Route Frequency Ordered Stop   07/18/17 1300  piperacillin-tazobactam (ZOSYN) IVPB 2.25 g     2.25 g 100 mL/hr over 30 Minutes Intravenous Every 6 hours 07/18/17 1245       Subjective: Seen and examined and was improved and wanted to know when he could go home. States he only had abdominal pain when his belly was palpated. Also stated that his drain was leaking quite severely. Denied any swelling. No CP or SOB.   Objective: Vitals:   07/21/17 2312 07/22/17 0330 07/22/17 0400 07/22/17 1500  BP: (!) 158/73  (!) 169/78   Pulse:  75 95   Resp:  11 (!) 23   Temp: 97.6 F (36.4 C) 98.7 F (37.1 C)  98.1 F (36.7 C)  TempSrc: Oral Axillary  Axillary  SpO2:  97% 96%   Weight:      Height:        Intake/Output Summary (Last 24 hours) at 07/22/17 1731 Last data filed at 07/22/17 1139  Gross per 24 hour  Intake              275 ml  Output             1300 ml  Net            -1025 ml   Filed Weights   07/15/17 1707 07/15/17 2300  Weight: 71.7 kg (158 lb) 70.9 kg (156 lb 4.9 oz)   Examination: Physical Exam:  Constitutional: WN/WD Caucasian Male in NAD who is calm  Eyes: Sclerae anicteric. Lids normal ENMT: External Ears and Nose appear Normal Neck: Supple with no JVD Respiratory: Diminished to auscultation. No appreciable wheezing/rales/rhonchi. Unlabored breathing Cardiovascular: Slight bradycardic. 2/6 Systolic murmur. 1+ edema Abdomen: Soft, Mildly tender to palpate abdomen. Right sided drain with severe leak and brown and green discharge GU: Deferred. Has foley catheter draining dark orange urine Musculoskeletal: No contractures; No cyanosis Skin: Warm. Has  severe drainage coming from drain insertion sight and color of drainage is brown. Has right Leg ulcerations. Neurologic: CN 2-12 grossly intact. No appreciable focal deficits Psychiatric: Normal Mood and affect. Awake and Alert and oriented x3.  Data Reviewed: I have personally reviewed following labs and imaging studies  CBC:  Recent Labs Lab 07/19/17 0548 07/19/17 1147 07/20/17 0400 07/20/17 1911 07/21/17 0453 07/22/17 0327  WBC 10.4 8.7 8.0  --  6.1 5.5  NEUTROABS 7.8* 6.5 6.0  --  4.4 3.7  HGB 7.4* 7.7* 7.2* 9.1* 8.0* 9.2*  HCT 21.4* 22.4* 21.4* 27.2* 23.9* 28.9*  MCV 87.3 88.2 89.5  --  90.2 92.9  PLT 283 249 250  --  183 188   Basic Metabolic Panel:  Recent Labs Lab 07/18/17 1330 07/19/17 0548 07/20/17 0400 07/20/17 1911 07/21/17 0453 07/22/17 0327  NA 141 144 147* 145 147*  146* 146*  146*  K 4.8 3.4* 2.9* 3.2* 2.9*  2.9* 3.5  3.4*  CL 109 103 104 104 105  103 105  104  CO2 14* 19* 24 25 25  24 23  22   GLUCOSE 84 95 97 94 92  91 77  78  BUN 101* 98* 90* 83* 79*  80* 75*  75*  CREATININE 4.29* 4.35* 4.25* 4.04* 4.08*  4.09* 4.05*  4.03*  CALCIUM 8.8* 8.2* 8.3* 8.3* 8.4*  8.3* 8.6*  8.5*  MG 2.0 1.7 1.6*  --  1.8 1.7  PHOS 6.4* 5.8* 5.3*  --  5.2*  5.3* 4.9*  5.0*   GFR: Estimated Creatinine Clearance: 16.9 mL/min (A) (by C-G formula based on SCr of 4.03 mg/dL (H)). Liver Function Tests:  Recent Labs Lab 07/18/17 1330 07/19/17 0548 07/20/17 0400 07/21/17 0453 07/22/17 0327  AST 25 19 17 15 15   ALT 16* 15* 13* 12* 12*  ALKPHOS 80 69 59 52 53  BILITOT 1.5* 1.6* 1.4* 1.2 1.3*  PROT 5.8* 4.9* 4.9* 4.9* 5.8*  ALBUMIN 2.0* 1.6* 1.4* 1.4*  1.3* 1.5*  1.5*    Recent Labs Lab 07/15/17 1833  LIPASE 41    Recent Labs Lab 07/18/17 1640 07/19/17 0548  AMMONIA 52* 29   Coagulation Profile:  Recent Labs Lab 07/17/17 1041 07/18/17 1640  INR 1.21 1.21   Cardiac Enzymes:  Recent Labs Lab 07/15/17 1833  TROPONINI <0.03    BNP (last 3 results) No results for input(s): PROBNP in the last 8760 hours. HbA1C: No results for input(s): HGBA1C in the last 72 hours. CBG:  Recent Labs Lab 07/20/17 1135  GLUCAP 102*   Lipid Profile: No results for input(s): CHOL, HDL, LDLCALC, TRIG, CHOLHDL, LDLDIRECT in the last 72 hours. Thyroid Function Tests: No results for input(s): TSH, T4TOTAL, FREET4, T3FREE, THYROIDAB in the last 72 hours. Anemia Panel: No results for input(s): VITAMINB12, FOLATE, FERRITIN, TIBC, IRON, RETICCTPCT in the last 72 hours. Sepsis Labs: No results for input(s): PROCALCITON, LATICACIDVEN in the last 168 hours.  Recent Results (from the past 240 hour(s))  Aerobic/Anaerobic Culture (surgical/deep wound)     Status: Abnormal   Collection Time: 07/18/17 11:42 AM  Result Value Ref Range Status   Specimen Description ABDOMEN  Final   Special Requests Normal  Final   Gram Stain   Final    ABUNDANT WBC PRESENT, PREDOMINANTLY PMN ABUNDANT GRAM NEGATIVE RODS ABUNDANT GRAM POSITIVE COCCI MODERATE GRAM POSITIVE RODS    Culture (A)  Final    MULTIPLE ORGANISMS PRESENT, NONE PREDOMINANT MIXED ANAEROBIC FLORA PRESENT.  CALL LAB IF FURTHER IID REQUIRED.    Report Status 07/21/2017 FINAL  Final  Culture, Urine     Status: Abnormal   Collection Time: 07/18/17 12:11 PM  Result Value Ref Range Status   Specimen Description URINE, RANDOM  Final   Special Requests NONE  Final   Culture MULTIPLE SPECIES PRESENT, SUGGEST RECOLLECTION (A)  Final   Report Status 07/19/2017 FINAL  Final  Culture, blood (routine x 2)     Status: None (Preliminary result)   Collection Time: 07/18/17  1:30 PM  Result Value Ref Range Status   Specimen Description BLOOD LEFT HAND  Final   Special Requests IN PEDIATRIC BOTTLE Blood Culture adequate volume  Final   Culture  NO GROWTH 4 DAYS  Final   Report Status PENDING  Incomplete  Culture, blood (routine x 2)     Status: None (Preliminary result)   Collection Time:  07/18/17  1:30 PM  Result Value Ref Range Status   Specimen Description BLOOD RIGHT HAND  Final   Special Requests IN PEDIATRIC BOTTLE Blood Culture adequate volume  Final   Culture NO GROWTH 4 DAYS  Final   Report Status PENDING  Incomplete  MRSA PCR Screening     Status: None   Collection Time: 07/18/17  7:10 PM  Result Value Ref Range Status   MRSA by PCR NEGATIVE NEGATIVE Final    Comment:        The GeneXpert MRSA Assay (FDA approved for NASAL specimens only), is one component of a comprehensive MRSA colonization surveillance program. It is not intended to diagnose MRSA infection nor to guide or monitor treatment for MRSA infections.   Culture, Urine     Status: None   Collection Time: 07/19/17  1:20 PM  Result Value Ref Range Status   Specimen Description URINE, CATHETERIZED  Final   Special Requests NONE  Final   Culture NO GROWTH  Final   Report Status 07/20/2017 FINAL  Final  Culture, Urine     Status: Abnormal   Collection Time: 07/20/17  1:05 PM  Result Value Ref Range Status   Specimen Description URINE, CATHETERIZED  Final   Special Requests NONE  Final   Culture 30,000 COLONIES/mL YEAST (A)  Final   Report Status 07/21/2017 FINAL  Final    Radiology Studies: Dg Chest 2 View  Result Date: 07/21/2017 CLINICAL DATA:  Pleural effusion. History of coronary artery disease, hypertension. EXAM: CHEST  2 VIEW COMPARISON:  Chest x-rays dated 07/18/2017, 07/15/2017 and 12/16/2012. FINDINGS: Small right pleural effusion. Persistent elevation of the right hemidiaphragm, likely with overlying atelectasis. Left lung appears clear. Heart size and mediastinal contours are stable. Large hiatal hernia better demonstrated on earlier chest x-rays. Percutaneous drainage catheter underlies the right hemidiaphragm. IMPRESSION: Small right pleural effusion, with probable adjacent atelectasis. Electronically Signed   By: Bary Richard M.D.   On: 07/21/2017 18:29   Dg Chest Port 1  View  Result Date: 07/22/2017 CLINICAL DATA:  Shortness of breath. History of coronary artery disease and hypertension. EXAM: PORTABLE CHEST 1 VIEW COMPARISON:  Chest x-rays dated 07/21/2017 and 07/15/2017. FINDINGS: Mild cardiomegaly is stable. Overall cardiomediastinal silhouette is stable in size and configuration. Atherosclerotic calcifications noted at the aortic arch. Small right pleural effusion and/or atelectasis is stable. Left lung remains clear. Percutaneous drainage catheter underlying the right hemidiaphragm. IMPRESSION: 1. Stable chest x-ray. Small right pleural effusion and/or atelectasis. No evidence of pneumonia or pulmonary edema. 2. Aortic atherosclerosis. Electronically Signed   By: Bary Richard M.D.   On: 07/22/2017 07:41   Scheduled Meds: . allopurinol  300 mg Oral QODAY  . aspirin EC  81 mg Oral Daily  . chlorhexidine  15 mL Mouth Rinse BID  . collagenase   Topical Daily  . [START ON 07/24/2017] heparin  5,000 Units Subcutaneous Q8H  . mouth rinse  15 mL Mouth Rinse q12n4p  . metoprolol succinate  12.5 mg Oral Daily  . multivitamin with minerals  1 tablet Oral Daily  . pantoprazole (PROTONIX) IV  40 mg Intravenous Q24H  . sodium chloride flush  5 mL Intravenous Q8H  . thiamine  100 mg Oral Daily   Or  . thiamine  100 mg Intravenous Daily  Continuous Infusions: . dextrose 5 % and 0.45% NaCl 50 mL/hr (07/22/17 1545)  . piperacillin-tazobactam (ZOSYN)  IV Stopped (07/22/17 1616)  . potassium chloride      LOS: 7 days   Merlene Laughter, DO [facility] Pager [phone]  If 7PM-7AM, please contact night-coverage [email or web address] Password [email or web address] 07/22/2017, 5:31 PM

## 2017-07-22 NOTE — Progress Notes (Signed)
Referring Physician(s): Dr Elwyn Lade  Supervising Physician: Oley Balm  Patient Status:  [facility] - In-pt  Chief Complaint:   10/24: S/p CT DRAIN RT ABDOMEN Continued copious output from drain and around site  Subjective:  Output has changed from greenish to definite brown/stool like ++ odor Output is coming out around site still Changed dressing again myself this am Tender site  Allergies: Patient has no known allergies.  Medications: Prior to Admission medications   Medication Sig Start Date End Date Taking? Authorizing Provider  allopurinol (ZYLOPRIM) 300 MG tablet Take 300 mg by mouth daily.   Yes [provider]  aspirin EC 81 MG tablet Take 81 mg by mouth daily.   Yes [provider]  cephALEXin (KEFLEX) 500 MG capsule Take 500 mg by mouth 3 (three) times daily.   Yes [provider]  Cholecalciferol (VITAMIN D3) 2000 units TABS Take 1 tablet by mouth 2 (two) times daily.   Yes [provider]  Ferrous Sulfate (IRON) 325 (65 Fe) MG TABS Take 1 tablet by mouth 2 (two) times daily.    Yes [provider]  furosemide (LASIX) 20 MG tablet Take 20 mg by mouth daily.    Yes [provider]  metoprolol succinate (TOPROL-XL) 25 MG 24 hr tablet Take 12.5 mg by mouth every other day.    Yes [provider]  omeprazole (PRILOSEC) 20 MG capsule Take 20 mg by mouth daily.   Yes [provider]  potassium chloride SA (K-DUR,KLOR-CON) 20 MEQ tablet Take 20 mEq by mouth daily.   Yes [provider]  PREDNISONE PO Take 1 tablet by mouth daily.   Yes [provider]     Vital Signs: BP (!) 169/78 (BP Location: Right Arm)   Pulse 95   Temp 98.7 F (37.1 C) (Axillary)   Resp (!) 23   Ht 5\' 10"  (1.778 m)   Wt 156 lb 4.9 oz (70.9 kg)   SpO2 96%   BMI 22.43 kg/m   Physical Exam  Abdominal: Soft. There is tenderness.  Tender right abdomen Tender at site  Musculoskeletal: Normal  range of motion.  Skin: Skin is warm and dry.  Site has no sign of infection Copious brown stool like flow from around site and into gravity bag Cleaned and redressed myself Mult orgs on Cx 315 cc yesterday 75 cc output this am---20 cc in bag   Nursing note and vitals reviewed.   Imaging: Ct Abdomen Pelvis Wo Contrast  Result Date: 07/18/2017 CLINICAL DATA:  71 year old male with recent drainage of right abdominal abscess. Follow-up study. EXAM: CT ABDOMEN AND PELVIS WITHOUT CONTRAST TECHNIQUE: Multidetector CT imaging of the abdomen and pelvis was performed following the standard protocol without IV contrast. COMPARISON:  Abdominal CT dated 07/16/2017 and 07/18/2017 FINDINGS: Evaluation of this exam is limited in the absence of intravenous contrast. Evaluation is also limited due to respiratory motion artifact as well as streak artifact caused by patient's arms. Lower chest: Partially visualized small right pleural effusion with associated mild compressive atelectasis of the right lung base. Left lung base linear atelectasis/ scarring. There is mild cardiomegaly. Multi vessel coronary vascular calcification as well as calcification of the mitral annulus. There is hypoattenuation of the cardiac blood pool suggestive of a degree of anemia. Clinical correlation is recommended. Hepatobiliary: There is mild surface nodularity of the liver concerning for early changes of cirrhosis. Clinical correlation is recommended. No intrahepatic biliary ductal dilatation. The gallbladder is distended. There  is a 1.8 cm stone within the gallbladder. No pericholecystic fluid. Pancreas: Unremarkable. No pancreatic ductal dilatation or surrounding inflammatory changes. Spleen: Normal in size without focal abnormality. Adrenals/Urinary Tract: The adrenal glands are unremarkable. There is no hydronephrosis or nephrolithiasis on either side. The visualized ureters appear grossly unremarkable. The urinary bladder is  decompressed around a Foley catheter. There is apparent diffuse thickening of the bladder wall which may be partly related to underdistention. Cystitis is not excluded. Correlation with urinalysis recommended. Stomach/Bowel: There is extensive sigmoid diverticulosis without active inflammatory changes. There is no evidence of bowel obstruction or active inflammation. The appendix is normal. 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 The abdominal aorta and IVC are otherwise grossly unremarkable on this noncontrast study. No portal venous gas identified. There is no adenopathy. Reproductive: The prostate and seminal vesicles are grossly unremarkable. Other: There has been significant interval decrease in the site of the perihepatic fluid collection compared to the prior studies. A right subcostal approach drainage catheter is seen with tip along the lateral capsule of the right lobe of the liver. Minimal amount of residual fluid and air remains. Small amount of free fluid is also seen within the pelvis. Musculoskeletal: There is diffuse subcutaneous edema and anasarca. No drainable fluid collection. Degenerative changes of the spine and median sternotomy wires. No acute osseous pathology. IMPRESSION: 1. Significant interval resolution of the previously seen perihepatic fluid collection status post drainage catheter placement. The tip of the drainage catheter is positioned along the lateral aspect of the right lobe of the liver superiorly. Very small amount of residual fluid and air remains. 2. Irregular hepatic contour likely changes of cirrhosis. 3. Extensive sigmoid diverticulosis.  No active inflammation. 4. Gallstone. Electronically Signed   By: Elgie Collard M.D.   On: 07/18/2017 20:28   Dg Chest 2 View  Result Date: 07/21/2017 CLINICAL DATA:  Pleural effusion. History of coronary artery disease, hypertension. EXAM: CHEST  2 VIEW COMPARISON:  Chest x-rays dated 07/18/2017,  07/15/2017 and 12/16/2012. FINDINGS: Small right pleural effusion. Persistent elevation of the right hemidiaphragm, likely with overlying atelectasis. Left lung appears clear. Heart size and mediastinal contours are stable. Large hiatal hernia better demonstrated on earlier chest x-rays. Percutaneous drainage catheter underlies the right hemidiaphragm. IMPRESSION: Small right pleural effusion, with probable adjacent atelectasis. Electronically Signed   By: Bary Richard M.D.   On: 07/21/2017 18:29   Ct Head Wo Contrast  Result Date: 07/18/2017 CLINICAL DATA:  Altered level of consciousness, unexplained. EXAM: CT HEAD WITHOUT CONTRAST TECHNIQUE: Contiguous axial images were obtained from the base of the skull through the vertex without intravenous contrast. COMPARISON:  09/08/2005 FINDINGS: Brain: No acute finding by CT. Generalized brain atrophy. Chronic small-vessel ischemic changes throughout the hemispheric white matter, basal ganglia and thalami I. No mass lesion, hemorrhage, hydrocephalus or extra-axial collection. Vascular: There is atherosclerotic calcification of the major vessels at the base of the brain. Skull: Negative Sinuses/Orbits: Clear/normal Other: None IMPRESSION: No acute finding by CT. Atrophy and chronic small-vessel ischemic changes. Electronically Signed   By: Paulina Fusi M.D.   On: 07/18/2017 15:55   Dg Chest Port 1 View  Result Date: 07/22/2017 CLINICAL DATA:  Shortness of breath. History of coronary artery disease and hypertension. EXAM: PORTABLE CHEST 1 VIEW COMPARISON:  Chest x-rays dated 07/21/2017 and 07/15/2017. FINDINGS: Mild cardiomegaly is stable. Overall cardiomediastinal silhouette is stable in size and configuration. Atherosclerotic calcifications noted at the aortic arch. Small right pleural effusion and/or atelectasis is stable. Left  lung remains clear. Percutaneous drainage catheter underlying the right hemidiaphragm. IMPRESSION: 1. Stable chest x-ray. Small right  pleural effusion and/or atelectasis. No evidence of pneumonia or pulmonary edema. 2. Aortic atherosclerosis. Electronically Signed   By: Bary RichardStan  Maynard M.D.   On: 07/22/2017 07:41   Dg Chest Port 1 View  Result Date: 07/18/2017 CLINICAL DATA:  Status post placement of a perihepatic drain for abscess today. EXAM: PORTABLE CHEST 1 VIEW COMPARISON:  CT abdomen and pelvis 07/16/2017 and images from drain placement today. PA and lateral chest 07/15/2017. FINDINGS: Drainage catheter is identified in the right upper quadrant of the abdomen. There is mild elevation the right hemidiaphragm relative to the left, unchanged. Lungs are clear. No pneumothorax or pleural effusion. Patient status post median sternotomy. Heart size is upper normal. IMPRESSION: No acute cardiopulmonary disease. Drainage catheter in the right upper quadrant of the abdomen. Electronically Signed   By: Drusilla Kannerhomas  Dalessio M.D.   On: 07/18/2017 14:10   Ct Image Guided Drainage By Percutaneous Catheter  Result Date: 07/18/2017 INDICATION: Large right intra-abdominal abscess EXAM: CT DRAINAGE RIGHT INTRA-ABDOMINAL ABSCESS MEDICATIONS: The patient is currently admitted to the [facility] and receiving intravenous antibiotics. The antibiotics were administered within an appropriate time frame prior to the initiation of the procedure. ANESTHESIA/SEDATION: Moderate Sedation Time:  None. The patient was continuously monitored during the procedure by the interventional radiology nurse under my direct supervision. COMPLICATIONS: None immediate. PROCEDURE: Informed written consent was obtained from the patient and his family after a thorough discussion of the procedural risks, benefits and alternatives. All questions were addressed. Maximal Sterile Barrier Technique was utilized including caps, mask, sterile gowns, sterile gloves, sterile drape, hand hygiene and skin antiseptic. A timeout was performed prior to the initiation of the procedure. Previous imaging  reviewed. Patient positioned supine. Noncontrast localization CT performed to localize the right abdominal large abscess. Overlying skin marked in the right mid abdomen. Under sterile conditions and local anesthesia, an 18 gauge 10 cm access needle was advanced percutaneously into the large fluid collection. Needle position confirmed with CT. Amplatz guidewire inserted followed by tract dilatation to insert a 16 French abscess drain. Drain catheter position confirmed with CT. Syringe aspiration yielded 1100 cc purulent fluid compatible with abscess. Sample sent for Gram stain and culture. Catheter secured with a Prolene suture and connected to external gravity drainage bag. Patient tolerated the procedure well. No immediate complication. IMPRESSION: Successful CT-guided right abdominal abscess drain catheter insertion. Electronically Signed   By: Judie PetitM.  Shick M.D.   On: 07/18/2017 12:52    Labs:  CBC:  Recent Labs  07/19/17 1147 07/20/17 0400 07/20/17 1911 07/21/17 0453 07/22/17 0327  WBC 8.7 8.0  --  6.1 5.5  HGB 7.7* 7.2* 9.1* 8.0* 9.2*  HCT 22.4* 21.4* 27.2* 23.9* 28.9*  PLT 249 250  --  183 188    COAGS:  Recent Labs  07/17/17 1041 07/18/17 1640  INR 1.21 1.21  APTT  --  33    BMP:  Recent Labs  07/20/17 0400 07/20/17 1911 07/21/17 0453 07/22/17 0327  NA 147* 145 147*  146* 146*  146*  K 2.9* 3.2* 2.9*  2.9* 3.5  3.4*  CL 104 104 105  103 105  104  CO2 24 25 25  24 23  22   GLUCOSE 97 94 92  91 77  78  BUN 90* 83* 79*  80* 75*  75*  CALCIUM 8.3* 8.3* 8.4*  8.3* 8.6*  8.5*  CREATININE 4.25* 4.04* 4.08*  4.09* 4.05*  4.03*  GFRNONAA 13* 14* 13*  13* 14*  14*  GFRAA 15* 16* 16*  16* 16*  16*    LIVER FUNCTION TESTS:  Recent Labs  07/19/17 0548 07/20/17 0400 07/21/17 0453 07/22/17 0327  BILITOT 1.6* 1.4* 1.2 1.3*  AST 19 17 15 15   ALT 15* 13* 12* 12*  ALKPHOS 69 59 52 53  PROT 4.9* 4.9* 4.9* 5.8*  ALBUMIN 1.6* 1.4* 1.4*  1.3* 1.5*  1.5*     Assessment and Plan:  Right abd abscess drain intact Output now feculent Planned drain inj for Mon----will discuss with Dr Deanne Coffer  Electronically Signed: Ralene Muskrat A, PA-C 07/22/2017, 8:38 AM   I spent a total of 15 Minutes at the the patient's bedside AND on the patient's [facility] floor or unit, greater than 50% of which was counseling/coordinating care for rt abd absc drain

## 2017-07-22 NOTE — Consult Note (Signed)
WOC Nurse wound consult note Reason for Consult: Per request of [facility] PA J. Focht to place fistula pouch over drain site to minimize risk of peritube skin damage.  Pouching system will also quantify drainage in the peritube area. I am assisted in the pouch application by the patient's bedside RN. Note:  Patient is to have a procedure in IR tomorrow. Wound type: Infectious,  Pressure Injury POA: N/A Measurement: N/A Wound bed: N/A Drainage (amount, consistency, odor)  Green, brown with foul odor. Thin consistency. Periwound: intact with mild peritube insertion site erythema only. Tube secure with suture and mild erythema at suture site noted.  Dressing procedure/placement/frequency: Patient winces when tape and drain sponges are removed. Dressings saturated with fresh and dried drainage, see above for color and consistency. Rational of pouching system explained to patient and wife. Eakin pouching system with skin barrier ring applied and 1-inch paper tape used to picture-frame the system. Patient is noted to have increased peritube drainage with increase in intra-abdominal pressure. A spare Eakin pouch and skin barrier ring are left at the bedside. WOC nursing team will follow along, and will remain available to this patient, the nursing, surgical and medical teams.  Please re-consult if needed between visits. Thanks, Ladona MowLaurie Mekaela Azizi, MSN, RN, GNP, Hans EdenCWOCN, CWON-AP, FAAN  Pager# [phone]

## 2017-07-22 NOTE — Progress Notes (Signed)
S:71 year old gentleman with hypertension, alcoholic liver cirrhosis, HTN and CKD presented with generalized weakness, found to have had a perihepatic abscess with duodenal perforation, which was drained and worsening of renal function.  Patient was asking for some water, as he was nothing by mouth for a procedure by IR today, otherwise feeling better. He denies any more abdominal pain.  Drain with copious amount of greenish to brownish discharge with strong fecal smell.  O:BP (!) 169/78 (BP Location: Right Arm)   Pulse 95   Temp 98.7 F (37.1 C) (Axillary)   Resp (!) 23   Ht 5\' 10"  (1.778 m)   Wt 156 lb 4.9 oz (70.9 kg)   SpO2 96%   BMI 22.43 kg/m   Intake/Output Summary (Last 24 hours) at 07/22/17 1557 Last data filed at 07/22/17 1139  Gross per 24 hour  Intake              275 ml  Output             1300 ml  Net            -1025 ml   Intake/Output: I/O last 3 completed shifts: In: 960 [P.O.:240; I.V.:15; Other:5; IV Piggyback:700] Out: 1275 [Urine:1150; Drains:125]  Intake/Output this shift:  Total I/O In: -  Out: 550 [Urine:550] Weight change:   Gen: well-developed gentleman,A & O X 2,in no acute distress. CVS: regular rate and rhythm. Gr 2/6 m Resp: decreased breath sounds at right base. Crackles above Abd: soft, mild diffuse tenderness,RUQ drain with copious greenish discharge with fecal smell. Ext: 1+ LE  edema.  Recent Labs Lab 07/15/17 1833 07/16/17 0423 07/17/17 0425 07/18/17 1330 07/19/17 0548 07/20/17 0400 07/20/17 1911 07/21/17 0453 07/22/17 0327  NA 133* 135 137 141 144 147* 145 147*  146* 146*  146*  K 5.6* 5.1 5.5* 4.8 3.4* 2.9* 3.2* 2.9*  2.9* 3.5  3.4*  CL 105 108 112* 109 103 104 104 105  103 105  104  CO2 15* 14* 12* 14* 19* 24 25 25  24 23  22   GLUCOSE 115* 75 82 84 95 97 94 92  91 77  78  BUN 93* 91* 91* 101* 98* 90* 83* 79*  80* 75*  75*  CREATININE 3.90* 3.83* 3.97* 4.29* 4.35* 4.25* 4.04* 4.08*  4.09* 4.05*  4.03*   ALBUMIN 2.2* 2.0* 1.9* 2.0* 1.6* 1.4*  --  1.4*  1.3* 1.5*  1.5*  CALCIUM 8.9 8.4* 8.5* 8.8* 8.2* 8.3* 8.3* 8.4*  8.3* 8.6*  8.5*  PHOS  --   --  7.5* 6.4* 5.8* 5.3*  --  5.2*  5.3* 4.9*  5.0*  AST 22 19  --  25 19 17   --  15 15  ALT 17 15*  --  16* 15* 13*  --  12* 12*   Liver Function Tests:  Recent Labs Lab 07/20/17 0400 07/21/17 0453 07/22/17 0327  AST 17 15 15   ALT 13* 12* 12*  ALKPHOS 59 52 53  BILITOT 1.4* 1.2 1.3*  PROT 4.9* 4.9* 5.8*  ALBUMIN 1.4* 1.4*  1.3* 1.5*  1.5*    Recent Labs Lab 07/15/17 1833  LIPASE 41    Recent Labs Lab 07/18/17 1640 07/19/17 0548  AMMONIA 52* 29   CBC:  Recent Labs Lab 07/19/17 0548 07/19/17 1147 07/20/17 0400 07/20/17 1911 07/21/17 0453 07/22/17 0327  WBC 10.4 8.7 8.0  --  6.1 5.5  NEUTROABS 7.8* 6.5 6.0  --  4.4 3.7  HGB  7.4* 7.7* 7.2* 9.1* 8.0* 9.2*  HCT 21.4* 22.4* 21.4* 27.2* 23.9* 28.9*  MCV 87.3 88.2 89.5  --  90.2 92.9  PLT 283 249 250  --  183 188   Cardiac Enzymes:  Recent Labs Lab 07/15/17 1833  TROPONINI <0.03   CBG:  Recent Labs Lab 07/20/17 1135  GLUCAP 102*    Iron Studies: No results for input(s): IRON, TIBC, TRANSFERRIN, FERRITIN in the last 72 hours. Studies/Results: Dg Chest 2 View  Result Date: 07/21/2017 CLINICAL DATA:  Pleural effusion. History of coronary artery disease, hypertension. EXAM: CHEST  2 VIEW COMPARISON:  Chest x-rays dated 07/18/2017, 07/15/2017 and 12/16/2012. FINDINGS: Small right pleural effusion. Persistent elevation of the right hemidiaphragm, likely with overlying atelectasis. Left lung appears clear. Heart size and mediastinal contours are stable. Large hiatal hernia better demonstrated on earlier chest x-rays. Percutaneous drainage catheter underlies the right hemidiaphragm. IMPRESSION: Small right pleural effusion, with probable adjacent atelectasis. Electronically Signed   By: Bary Richard M.D.   On: 07/21/2017 18:29   Dg Chest Port 1 View  Result  Date: 07/22/2017 CLINICAL DATA:  Shortness of breath. History of coronary artery disease and hypertension. EXAM: PORTABLE CHEST 1 VIEW COMPARISON:  Chest x-rays dated 07/21/2017 and 07/15/2017. FINDINGS: Mild cardiomegaly is stable. Overall cardiomediastinal silhouette is stable in size and configuration. Atherosclerotic calcifications noted at the aortic arch. Small right pleural effusion and/or atelectasis is stable. Left lung remains clear. Percutaneous drainage catheter underlying the right hemidiaphragm. IMPRESSION: 1. Stable chest x-ray. Small right pleural effusion and/or atelectasis. No evidence of pneumonia or pulmonary edema. 2. Aortic atherosclerosis. Electronically Signed   By: Bary Richard M.D.   On: 07/22/2017 07:41   . allopurinol  300 mg Oral QODAY  . aspirin EC  81 mg Oral Daily  . chlorhexidine  15 mL Mouth Rinse BID  . collagenase   Topical Daily  . [START ON 07/24/2017] heparin  5,000 Units Subcutaneous Q8H  . mouth rinse  15 mL Mouth Rinse q12n4p  . metoprolol succinate  12.5 mg Oral Daily  . multivitamin with minerals  1 tablet Oral Daily  . pantoprazole (PROTONIX) IV  40 mg Intravenous Q24H  . sodium chloride flush  5 mL Intravenous Q8H  . thiamine  100 mg Oral Daily   Or  . thiamine  100 mg Intravenous Daily    BMET    Component Value Date/Time   NA 146 (H) 07/22/2017 0327   NA 146 (H) 07/22/2017 0327   K 3.4 (L) 07/22/2017 0327   K 3.5 07/22/2017 0327   CL 104 07/22/2017 0327   CL 105 07/22/2017 0327   CO2 22 07/22/2017 0327   CO2 23 07/22/2017 0327   GLUCOSE 78 07/22/2017 0327   GLUCOSE 77 07/22/2017 0327   BUN 75 (H) 07/22/2017 0327   BUN 75 (H) 07/22/2017 0327   CREATININE 4.03 (H) 07/22/2017 0327   CREATININE 4.05 (H) 07/22/2017 0327   CALCIUM 8.5 (L) 07/22/2017 0327   CALCIUM 8.6 (L) 07/22/2017 0327   GFRNONAA 14 (L) 07/22/2017 0327   GFRNONAA 14 (L) 07/22/2017 0327   GFRAA 16 (L) 07/22/2017 0327   GFRAA 16 (L) 07/22/2017 0327   CBC     Component Value Date/Time   WBC 5.5 07/22/2017 0327   RBC 3.11 (L) 07/22/2017 0327   HGB 9.2 (L) 07/22/2017 0327   HCT 28.9 (L) 07/22/2017 0327   PLT 188 07/22/2017 0327   MCV 92.9 07/22/2017 0327   MCH 29.6 07/22/2017 0327  MCHC 31.8 07/22/2017 0327   RDW 14.5 07/22/2017 0327   LYMPHSABS 1.0 07/22/2017 0327   MONOABS 0.8 07/22/2017 0327   EOSABS 0.0 07/22/2017 0327   BASOSABS 0.0 07/22/2017 0327     Assessment/Plan:  AKI. Creatinine remained stable, clinically seems improving. Chest x-ray done yesterday shows small right-sided pleural effusion, that might need a tap to rule out any infectious pathology or extension of intra-abdominal abscess. Could be abscess extension -continue monitoring renal function. Start low vol ivf  Hypokalemia.improved after repletion electrolytes yesterday, potassium at 3.4 today. -IV KCl X 2 -Keep monitoring electrolyte.  Abdominal abscess ? perforation. Still with a good amount of draining fluid. Surgery and interventional radiology is following. Surgery do not think that he is a good candidate for surgery. ?  IR postponed procedure for tomorrow. -Continue antibiotics and monitoring.  Anemia. Hemoglobin stable. -Keep monitor CBC and transfuse if needed.  Hypertension. Blood pressure mildly elevated today.Marland Kitchen Keep monitoring.  Alcohol abuse. Continue CIWAprotocol. NOT CLEAR has signif liver disease  Encephalopathy .improving  CAD: currently stable.  I have seen and examined this patient and agree with the plan of care seen, eval, examined, discussed with resident and primary MD .  Kevia Zaucha L 07/22/2017, 4:44 PM

## 2017-07-22 NOTE — Progress Notes (Addendum)
Patient ID: [facility], male   DOB: 02/24/1946, 71 y.o.   MRN: [id number]   Pt is scheduled for IR procedure of drain injection and possible revision 10/29 am. See new orders   Also ordered wound care RN evaluation

## 2017-07-22 NOTE — Progress Notes (Signed)
Subjective/Chief Complaint: Pt doing well JP drain changed in color from green to brown    Objective: Vital signs in last 24 hours: Temp:  [97.6 F (36.4 C)-98.7 F (37.1 C)] 98.7 F (37.1 C) (10/28 0330) Pulse Rate:  [61-95] 95 (10/28 0400) Resp:  [11-23] 23 (10/28 0400) BP: (150-173)/(70-78) 169/78 (10/28 0400) SpO2:  [96 %-97 %] 96 % (10/28 0400) Last BM Date: 07/21/17  Intake/Output from previous day: 10/27 0701 - 10/28 0700 In: 750 [P.O.:240; I.V.:5; IV Piggyback:500] Out: 750 [Urine:675; Drains:75] Intake/Output this shift: No intake/output data recorded.  Constitutional: No acute distress, conversant, appears states age. Eyes: Anicteric sclerae, moist conjunctiva, no lid lag Lungs: Clear to auscultation bilaterally, normal respiratory effort CV: regular rate and rhythm, no murmurs, no peripheral edema, pedal pulses 2+ GI: Soft, no masses or hepatosplenomegaly, TTP RUQ, JP brown drainage Skin: No rashes, palpation reveals normal turgor Psychiatric: appropriate judgment and insight, oriented to person, place, and time  Lab Results:   Recent Labs  07/21/17 0453 07/22/17 0327  WBC 6.1 5.5  HGB 8.0* 9.2*  HCT 23.9* 28.9*  PLT 183 188   BMET  Recent Labs  07/21/17 0453 07/22/17 0327  NA 147*  146* 146*  146*  K 2.9*  2.9* 3.5  3.4*  CL 105  103 105  104  CO2 25  24 23  22   GLUCOSE 92  91 77  78  BUN 79*  80* 75*  75*  CREATININE 4.08*  4.09* 4.05*  4.03*  CALCIUM 8.4*  8.3* 8.6*  8.5*   PT/INR No results for input(s): LABPROT, INR in the last 72 hours. ABG No results for input(s): PHART, HCO3 in the last 72 hours.  Invalid input(s): PCO2, PO2  Studies/Results: Dg Chest 2 View  Result Date: 07/21/2017 CLINICAL DATA:  Pleural effusion. History of coronary artery disease, hypertension. EXAM: CHEST  2 VIEW COMPARISON:  Chest x-rays dated 07/18/2017, 07/15/2017 and 12/16/2012. FINDINGS: Small right pleural effusion. Persistent  elevation of the right hemidiaphragm, likely with overlying atelectasis. Left lung appears clear. Heart size and mediastinal contours are stable. Large hiatal hernia better demonstrated on earlier chest x-rays. Percutaneous drainage catheter underlies the right hemidiaphragm. IMPRESSION: Small right pleural effusion, with probable adjacent atelectasis. Electronically Signed   By: Bary RichardStan  Maynard M.D.   On: 07/21/2017 18:29   Dg Chest Port 1 View  Result Date: 07/22/2017 CLINICAL DATA:  Shortness of breath. History of coronary artery disease and hypertension. EXAM: PORTABLE CHEST 1 VIEW COMPARISON:  Chest x-rays dated 07/21/2017 and 07/15/2017. FINDINGS: Mild cardiomegaly is stable. Overall cardiomediastinal silhouette is stable in size and configuration. Atherosclerotic calcifications noted at the aortic arch. Small right pleural effusion and/or atelectasis is stable. Left lung remains clear. Percutaneous drainage catheter underlying the right hemidiaphragm. IMPRESSION: 1. Stable chest x-ray. Small right pleural effusion and/or atelectasis. No evidence of pneumonia or pulmonary edema. 2. Aortic atherosclerosis. Electronically Signed   By: Bary RichardStan  Maynard M.D.   On: 07/22/2017 07:41    Anti-infectives: Anti-infectives    Start     Dose/Rate Route Frequency Ordered Stop   07/18/17 1300  piperacillin-tazobactam (ZOSYN) IVPB 2.25 g     2.25 g 100 mL/hr over 30 Minutes Intravenous Every 6 hours 07/18/17 1245        Assessment/Plan: Active Problems: AKI (acute kidney injury) (HCC) Polyarteritis nodosa (HCC) Normocytic anemia  Intraabdominal abscess adjacent to the liver - drained by IR with >1L of purulent drainage, culture pending - Drain study today per  IR, if fistula present, would con't to treat with drain for control.  High risk surgical pt 2/2 to cirrhosis - possible infected ascites given suggestion of cirrhosis vs SB perf - no active inflammation of stomach or bowel to suggest  perforation - recommend IV abx, IVF, ice chips - no acute surgical intervention - he would be very high risk with his cirrhosis    LOS: 7 days    Marigene Ehlers., Jed Limerick 07/22/2017

## 2017-07-23 ENCOUNTER — Inpatient Hospital Stay (HOSPITAL_COMMUNITY): Payer: Medicare Other

## 2017-07-23 ENCOUNTER — Encounter (HOSPITAL_COMMUNITY): Payer: Self-pay | Admitting: Interventional Radiology

## 2017-07-23 DIAGNOSIS — K921 Melena: Secondary | ICD-10-CM

## 2017-07-23 DIAGNOSIS — K625 Hemorrhage of anus and rectum: Secondary | ICD-10-CM

## 2017-07-23 DIAGNOSIS — K7031 Alcoholic cirrhosis of liver with ascites: Secondary | ICD-10-CM

## 2017-07-23 DIAGNOSIS — E43 Unspecified severe protein-calorie malnutrition: Secondary | ICD-10-CM

## 2017-07-23 DIAGNOSIS — D62 Acute posthemorrhagic anemia: Secondary | ICD-10-CM

## 2017-07-23 DIAGNOSIS — K651 Peritoneal abscess: Secondary | ICD-10-CM

## 2017-07-23 HISTORY — PX: IR SINUS/FIST TUBE CHK-NON GI: IMG673

## 2017-07-23 LAB — COMPREHENSIVE METABOLIC PANEL
ALK PHOS: 48 U/L (ref 38–126)
ALT: 10 U/L — ABNORMAL LOW (ref 17–63)
ANION GAP: 15 (ref 5–15)
AST: 19 U/L (ref 15–41)
Albumin: 1.4 g/dL — ABNORMAL LOW (ref 3.5–5.0)
BILIRUBIN TOTAL: 1.5 mg/dL — AB (ref 0.3–1.2)
BUN: 68 mg/dL — ABNORMAL HIGH (ref 6–20)
CALCIUM: 8.3 mg/dL — AB (ref 8.9–10.3)
CO2: 22 mmol/L (ref 22–32)
Chloride: 109 mmol/L (ref 101–111)
Creatinine, Ser: 3.81 mg/dL — ABNORMAL HIGH (ref 0.61–1.24)
GFR calc Af Amer: 17 mL/min — ABNORMAL LOW (ref >60)
GFR, EST NON AFRICAN AMERICAN: 15 mL/min — AB (ref >60)
Glucose, Bld: 96 mg/dL (ref 65–99)
POTASSIUM: 3.9 mmol/L (ref 3.5–5.1)
Sodium: 146 mmol/L — ABNORMAL HIGH (ref 135–145)
TOTAL PROTEIN: 5.3 g/dL — AB (ref 6.5–8.1)

## 2017-07-23 LAB — CBC WITH DIFFERENTIAL/PLATELET
BASOS ABS: 0 10*3/uL (ref 0.0–0.1)
Basophils Relative: 0 %
EOS ABS: 0 10*3/uL (ref 0.0–0.7)
EOS PCT: 0 %
HCT: 26.7 % — ABNORMAL LOW (ref 39.0–52.0)
Hemoglobin: 8.5 g/dL — ABNORMAL LOW (ref 13.0–17.0)
LYMPHS ABS: 1.1 10*3/uL (ref 0.7–4.0)
LYMPHS PCT: 19 %
MCH: 29.8 pg (ref 26.0–34.0)
MCHC: 31.8 g/dL (ref 30.0–36.0)
MCV: 93.7 fL (ref 78.0–100.0)
MONO ABS: 0.8 10*3/uL (ref 0.1–1.0)
Monocytes Relative: 15 %
Neutro Abs: 3.7 10*3/uL (ref 1.7–7.7)
Neutrophils Relative %: 66 %
PLATELETS: 186 10*3/uL (ref 150–400)
RBC: 2.85 MIL/uL — ABNORMAL LOW (ref 4.22–5.81)
RDW: 14 % (ref 11.5–15.5)
WBC: 5.6 10*3/uL (ref 4.0–10.5)

## 2017-07-23 LAB — CULTURE, BLOOD (ROUTINE X 2)
CULTURE: NO GROWTH
CULTURE: NO GROWTH
SPECIAL REQUESTS: ADEQUATE
SPECIAL REQUESTS: ADEQUATE

## 2017-07-23 LAB — PHOSPHORUS: PHOSPHORUS: 4.6 mg/dL (ref 2.5–4.6)

## 2017-07-23 LAB — HEMOGLOBIN AND HEMATOCRIT, BLOOD
HCT: 27.3 % — ABNORMAL LOW (ref 39.0–52.0)
HEMOGLOBIN: 8.7 g/dL — AB (ref 13.0–17.0)

## 2017-07-23 LAB — OCCULT BLOOD X 1 CARD TO LAB, STOOL: Fecal Occult Bld: NEGATIVE

## 2017-07-23 LAB — MAGNESIUM: Magnesium: 1.7 mg/dL (ref 1.7–2.4)

## 2017-07-23 MED ORDER — LIDOCAINE HCL 1 % IJ SOLN
INTRAMUSCULAR | Status: AC
  Filled 2017-07-23: qty 20

## 2017-07-23 MED ORDER — PANTOPRAZOLE SODIUM 40 MG IV SOLR
40.0000 mg | Freq: Two times a day (BID) | INTRAVENOUS | Status: DC
  Administered 2017-07-23 – 2017-07-25 (×4): 40 mg via INTRAVENOUS
  Filled 2017-07-23 (×4): qty 40

## 2017-07-23 MED ORDER — IOPAMIDOL (ISOVUE-300) INJECTION 61%
INTRAVENOUS | Status: AC
  Administered 2017-07-23: 50 mL
  Filled 2017-07-23: qty 50

## 2017-07-23 NOTE — Progress Notes (Signed)
PROGRESS NOTE    Jonathan Walker  ZOX:[id number] DOB: 01-24-1946 DOA: 07/15/2017 PCP: Samuel Jester, DO   Brief Narrative:  Patient is a 71 year old CAD s/p CABG, hypertension, hyperlipidemia, gout who resents with weakness and was found to have AKI.  His lasix was increased from every other day to daily dosing about 1 month ago.  For the last few days, however, he has not made very much urine output and he has become very fatigued.  Additionally, he was seen by dermatology, Dr. Margo Aye this month for some new right lower extremity ulcers.  They were biopsied and biopsy demonstrated no evidence of vasculitis, but sample was superficial and nonspecific.  He was diagnosed with polyarteritis nodosum and given keflex and prednisone taper which have helped his ulcers.  He had a weakly positive ANA and CANCA, both titers were 1:80 earlier this month. Creatinine has worsened and patient was transferred to [facility] and had a drain placed for an intra-abdominal abscess. Drain is leaking quite significantly and color changed to brown drainage and plan is for Drain Injection Monday 07/23/17. Patient's color of drainage was brown today resembling stool. Overnight patient had 4 bloody bowel movements and Stat H/H was ordered and Hb dropped slightly so Gastroenterology was consulted for further evaluation.   Assessment & Plan:   Active Problems:   AKI (acute kidney injury) (HCC)   Polyarteritis nodosa (HCC)   Normocytic anemia   Intra-abdominal abscess (HCC)   Acute encephalopathy   CAD (coronary artery disease)   Gout   Essential hypertension   Ulcers of both lower legs (HCC)   Hyperammonemia (HCC)   History of alcoholism (HCC)   Hypernatremia   Hypokalemia   Hypomagnesemia   Hyperphosphatemia  Acute kidney injury with microscopic Hematuria/Evidence of possible nephritis.   -Baseline creatinine is 0.9 on May 8th, 2018.  On and at that time his lasix dose was increased.   -Creatinine 1.38 on Oct 1st.   Possible this may all be due to ATN from dehydration/overdiuresis, but autoimmune disorder also possible. Cr has worsened in the setting of Abdominal Infection/Abscess; Slowly improving and now Cr is 3.81 -Nephrology consultation appreciated -ANA has been repeated -Mpo/pr-3 ANCA Ab WNL  -Complement:  C3 70 (Low), CH 50 and C4 wnl -HCV negative and HBV negative -HIV Non-Reactive -UPEP being collected -Bicarb gtt 50 mL/hr by Nephro now stopped by Nephro and they are starting IVF as below  -Lasix for Hyperkalemia held by Nephrology  -Patiromer stopped Nephrology -Avoid Kayexelate in setting of occult positive stool and unusual abdominal fluid collection -Renal US:  Minimally complex bilateral renal cysts, unchanged from prior -May need renal biopsy but will hold off for now and if Cr still stays up will biopsy in a few weeks. 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  -C/w IV Zosyn  -Nephro now repeating BMP and starting patient on D5W 1/2 NS IVF at 50 mL/hr  Intra-abdominal Abscess s/p Drain by IR with Suspected Duodenal Leak/ Small Bowel Perforation vs. Infected Ascites (less likely)  -Was originally suscpected infected ascites given suggestion of cirrhosis versus blood versus contained bowel perforation; Suspect likely from bowel or enteric source and now thinking it is a perforated duodenum -CT scan: Large fluid collection around the liver extending into the right lower quadrant with air fluid level -Transferred to [facility] for IR paracentesis and drain placement -IR drained 1.1 Liter of purulent discharge and drainage tube showed initally showed cloudy grey  purulent discharge  -IR sending Fluid for Bilirubin and if elevated will proceed with HIDA Scan to evaluate for Bile Leak; However unable to obtain Bilrubin Test on Abscess fluid per Lab; Per IR continue to Monitor Drain Output and because patient has Copious  amount of Drainage with leaking. The color of drainage changed from green to brown and  will need Drain Injection and is ordered for Monday 07/23/17 -Drain injection done today and No fistula was identified and drain was working and patent; IR recommending continue gravity drain, sterile flush once per day, and Routine drain care -WOC Nurse was consulted for Drainage Management and had Eakin Pouch and skin barrier placed -Ordered paracentesis labs with cytology and a hematocrit (in case it's bloody) -Patient was Pan-Cultured (Blood, Urine, CXR) and started on Empiric Zosyn; Will continue  -Repeat Urine Cx 07/19/17 showed No Growth and then 30,000 CFU of Yeast and likley is colonization but will need to remove foley and get Urine Cx.  -Abscess Gram Stain showed Abundant WBC's, Abundant GNR, Abundant Gram + Cocci and Gram + Rods and pending Cx showed Multiple species present non-predominant -Blood Cx show NGTD at 4 Days  -Abdominal CT Scan repeated ordered and showed significant interval resolution of perihepatic fluid collection s/p catheter drainage and very small amount of residual fluid and air remains -C/w IV Zosyn with Pharmacy to Dose -General Surgery following and recommending Observation and NPO as patient is a Cirrhotic and likely not a good Surgical Candidate and would be very high risk. -May need to re-image Abdomen  Hypernatremia -Mild at 146 -Bicarbonate gtt now stopped -Started on D5W 1/2 NS IVF at 50 mL/hr -Repeat CMP in AM  Hypokalemia, improved -Patient's K+ was 3.9 this AM -Continue to Monitor and Replete as Necessary -Repeat CMP in AM  Hypomagnesemia -Patient's Mag Level 1.6 and improved to 1.7 -Continue to Monitor and Replete as Necessary -Repeat Mag Level in AM  Hyperphosphatemia, improved  -Patient's Phos Level was 5.8 and went to 4.6  -Likely to improve if Kidney Fxn starts improving -Repeat Phos Level in AM  Acute Encephalopathy likely Infectious Etiology,  improved -Received 1 mg of Ativan 10/23 for CIWA but doubt his Encephalopathy is explained by that -Transferred to SDU for Closer Evaluation  -Ordered Head CT Scan w/o Contrast and showed no Acute Finding by CT but showed atrophy and chronic small-vessel ischemich cangeds -Bicarb gtt (150 mEQ) at 50 mL/hr (decreased by Nephro) now stopped; Patient on IVF with D5W 1/2 NS at 50 mL/hr -Checked Ammonia Level and decreased from 52 -> 29 -RPR was Non-Reactive  -If not improving will order MRI and EEG and discuss with Neurology but likely this is Infectious    Coronary Artery Disease -Stable -Held Aspirin given concern for GIB; C/w Beta Blocker -Not on Statin  Gout -Stable -Continue Allopurinol  Hypertension -Blood pressure low normal -Hold parameter for Metoprolol and continue at 12.5 mg po Daily   Leg Ulcers on Right Leg  -possible polyarteritis nodosa, improved with antibiotics and steroids -Good pedal pulse -ABI wnl -Appreciate wound care: Santyl with dry dressings daily  Leukocytosis likely from Intra-abdominal Abscess and suspected Duodenal Leak -Less Likely from Steroids -Started Patient on Emprici Zosyn and improved  -Pan-Culture and repeat Abdominal CT Scan as above  -WBC went from 22.4 -> 18.7 -> 13.1 -> 8.7 -> 8.0 -> 6.1 -> 5.5 -> 5.6 -Repeat CBC in AM  Anemia of inflammation and possible renal disease and slow GI losses and now BRBPR/Hematochezia, Blood  Bowel Movements -Hemoglobin continued to decline which may be hemodilutional but also consider blood loss around liver? -Iron studies suggest inflammation, B12 HIGH, folate wnl, SPEP pending -TSH 1.76 -Occult stool POSITIVE initially and repeat Negative; Wife describes it as Bright Red Blood last night and now has Dignisheild  -C/w Ferrous Sulfate 325 mg po BID -ASA 81 mg stopped  -Nephrology ordered Hemolysis Labs -Hb/Hct went from 7.2/21.2 -> 8.8/26.0 -> 7.4/21.4 -> 7.7/22.4 -> 7.2/21.4 -> 9.1/27.2 -> 8.0/23.9 ->  9.2/28.9 -> 8.7/27.3 -> 8.5/26.7 -S/p 1 unit of pRBC's  -Gastroenterology consulted for further Evaluation and Management for additional Recommendations  -Continue to Monitor for S/Sx of Bleeding and Repeat CBC in AM   Hx of EtOH Abuse/Concern for Withdrawal -Patient's family states he drinks heavily -Placed on CIWA and given 1 mg of Ativan last night   Hyperammonemia, improved -Patient's Ammonia Level 52 and improved to 29 -Repeat Ammonia Level in AM and await Repeat CT Scan findings prior to administration of Lactulose   Liver Cirrhosis -Continue to Monitor LFT's; AST is 19 and ALT is 10 -Repeat CMP in AM   Bilateral Upper Extremity Swelling -Likely dependent Edema -Elevate and if still worsening will get U/S -Continue to Monitor   DVT prophylaxis: Heparin 5,000 sq  q8h Code Status: DO NOT RESUSCITATE  Family Communication: Discussed with Wife over the phone Disposition Plan: Remain in SDU; SNF when medically stable   Consultants:   Nephrology  General Surgery  Interventional Radiology   Procedures:  S/p CT DRAIN RT ABDOMEN  DRAIN Injection and showed no fistula identified and drain is working and patent.    Antimicrobials: Anti-infectives    Start     Dose/Rate Route Frequency Ordered Stop   07/18/17 1300  piperacillin-tazobactam (ZOSYN) IVPB 2.25 g     2.25 g 100 mL/hr over 30 Minutes Intravenous Every 6 hours 07/18/17 1245       Subjective: Seen and examined and had no complaints this AM. Wife states he had a lot of bloody bowel movements and was "really red." No CP or SOB. No lightheadedness or dizziness. Has more swelling in upper extremities.   Objective: Vitals:   07/23/17 0803 07/23/17 0900 07/23/17 1000 07/23/17 1132  BP: (!) 142/72 (!) 155/72 (!) 141/61 (!) 145/64  Pulse: 69 68 63 67  Resp: 11 14 13 15   Temp: (!) 97.3 F (36.3 C)   97.8 F (36.6 C)  TempSrc: Oral   Oral  SpO2: 96% 95% 95% 94%  Weight:      Height:        Intake/Output  Summary (Last 24 hours) at 07/23/17 1442 Last data filed at 07/23/17 0800  Gross per 24 hour  Intake          1286.67 ml  Output              865 ml  Net           421.67 ml   Filed Weights   07/15/17 1707 07/15/17 2300  Weight: 71.7 kg (158 lb) 70.9 kg (156 lb 4.9 oz)   Examination: Physical Exam:  Constitutional: WN/WD Caucasian Male in NAD who appears calm today and has no complaints Eyes: Sclerae anicteric. Lids normal ENMT: External Ears and nose appear normal Neck: Supple with no JVD Respiratory: Diminished to auscultation. No appreciable wheezing/rales/rhonchi. Has unlabored breathing  Cardiovascular: RRR; 2/6 Systolic murmur. 2+ Upper extremity edema and 1+ LE edema Abdomen: Soft, Mildly tender to palpate. Bowel Sounds present. Right Drain  in place encapsulated by Myna Bright pouch with dark brown/reddish drainage GU: Has foley catheter in draining dark urine Musculoskeletal: No contractures; No cyanosis. Has bilateral Upper Extremity Arm swelling and likely dependent edema Skin: Warm. Has Eakins pouch encasing the catheter with some dark brown drainage. Not as copious as it has been. Has bruising in upper extremities and Right leg ulcerations that are covered; 1+ LE Edema Neurologic: CN 2-12 grossly intact. No appreciable focal deficits Psychiatric: Pleasant mood and affect. Awake and Alert and oriented x2 today.   Data Reviewed: I have personally reviewed following labs and imaging studies  CBC:  Recent Labs Lab 07/19/17 1147 07/20/17 0400 07/20/17 1911 07/21/17 0453 07/22/17 0327 07/22/17 2335 07/23/17 0903  WBC 8.7 8.0  --  6.1 5.5  --  5.6  NEUTROABS 6.5 6.0  --  4.4 3.7  --  3.7  HGB 7.7* 7.2* 9.1* 8.0* 9.2* 8.7* 8.5*  HCT 22.4* 21.4* 27.2* 23.9* 28.9* 27.3* 26.7*  MCV 88.2 89.5  --  90.2 92.9  --  93.7  PLT 249 250  --  183 188  --  186   Basic Metabolic Panel:  Recent Labs Lab 07/19/17 0548 07/20/17 0400 07/20/17 1911 07/21/17 0453 07/22/17 0327  07/23/17 0559  NA 144 147* 145 147*  146* 146*  146* 146*  K 3.4* 2.9* 3.2* 2.9*  2.9* 3.5  3.4* 3.9  CL 103 104 104 105  103 105  104 109  CO2 19* 24 25 25  24 23  22 22   GLUCOSE 95 97 94 92  91 77  78 96  BUN 98* 90* 83* 79*  80* 75*  75* 68*  CREATININE 4.35* 4.25* 4.04* 4.08*  4.09* 4.05*  4.03* 3.81*  CALCIUM 8.2* 8.3* 8.3* 8.4*  8.3* 8.6*  8.5* 8.3*  MG 1.7 1.6*  --  1.8 1.7 1.7  PHOS 5.8* 5.3*  --  5.2*  5.3* 4.9*  5.0* 4.6   GFR: Estimated Creatinine Clearance: 17.8 mL/min (A) (by C-G formula based on SCr of 3.81 mg/dL (H)). Liver Function Tests:  Recent Labs Lab 07/19/17 0548 07/20/17 0400 07/21/17 0453 07/22/17 0327 07/23/17 0559  AST 19 17 15 15 19   ALT 15* 13* 12* 12* 10*  ALKPHOS 69 59 52 53 48  BILITOT 1.6* 1.4* 1.2 1.3* 1.5*  PROT 4.9* 4.9* 4.9* 5.8* 5.3*  ALBUMIN 1.6* 1.4* 1.4*  1.3* 1.5*  1.5* 1.4*   No results for input(s): LIPASE, AMYLASE in the last 168 hours.  Recent Labs Lab 07/18/17 1640 07/19/17 0548  AMMONIA 52* 29   Coagulation Profile:  Recent Labs Lab 07/17/17 1041 07/18/17 1640  INR 1.21 1.21   Cardiac Enzymes: No results for input(s): CKTOTAL, CKMB, CKMBINDEX, TROPONINI in the last 168 hours. BNP (last 3 results) No results for input(s): PROBNP in the last 8760 hours. HbA1C: No results for input(s): HGBA1C in the last 72 hours. CBG:  Recent Labs Lab 07/20/17 1135  GLUCAP 102*   Lipid Profile: No results for input(s): CHOL, HDL, LDLCALC, TRIG, CHOLHDL, LDLDIRECT in the last 72 hours. Thyroid Function Tests: No results for input(s): TSH, T4TOTAL, FREET4, T3FREE, THYROIDAB in the last 72 hours. Anemia Panel: No results for input(s): VITAMINB12, FOLATE, FERRITIN, TIBC, IRON, RETICCTPCT in the last 72 hours. Sepsis Labs: No results for input(s): PROCALCITON, LATICACIDVEN in the last 168 hours.  Recent Results (from the past 240 hour(s))  Aerobic/Anaerobic Culture (surgical/deep wound)     Status:  Abnormal   Collection Time: 07/18/17  11:42 AM  Result Value Ref Range Status   Specimen Description ABDOMEN  Final   Special Requests Normal  Final   Gram Stain   Final    ABUNDANT WBC PRESENT, PREDOMINANTLY PMN ABUNDANT GRAM NEGATIVE RODS ABUNDANT GRAM POSITIVE COCCI MODERATE GRAM POSITIVE RODS    Culture (A)  Final    MULTIPLE ORGANISMS PRESENT, NONE PREDOMINANT MIXED ANAEROBIC FLORA PRESENT.  CALL LAB IF FURTHER IID REQUIRED.    Report Status 07/21/2017 FINAL  Final  Culture, Urine     Status: Abnormal   Collection Time: 07/18/17 12:11 PM  Result Value Ref Range Status   Specimen Description URINE, RANDOM  Final   Special Requests NONE  Final   Culture MULTIPLE SPECIES PRESENT, SUGGEST RECOLLECTION (A)  Final   Report Status 07/19/2017 FINAL  Final  Culture, blood (routine x 2)     Status: None   Collection Time: 07/18/17  1:30 PM  Result Value Ref Range Status   Specimen Description BLOOD LEFT HAND  Final   Special Requests IN PEDIATRIC BOTTLE Blood Culture adequate volume  Final   Culture NO GROWTH 5 DAYS  Final   Report Status 07/23/2017 FINAL  Final  Culture, blood (routine x 2)     Status: None   Collection Time: 07/18/17  1:30 PM  Result Value Ref Range Status   Specimen Description BLOOD RIGHT HAND  Final   Special Requests IN PEDIATRIC BOTTLE Blood Culture adequate volume  Final   Culture NO GROWTH 5 DAYS  Final   Report Status 07/23/2017 FINAL  Final  MRSA PCR Screening     Status: None   Collection Time: 07/18/17  7:10 PM  Result Value Ref Range Status   MRSA by PCR NEGATIVE NEGATIVE Final    Comment:        The GeneXpert MRSA Assay (FDA approved for NASAL specimens only), is one component of a comprehensive MRSA colonization surveillance program. It is not intended to diagnose MRSA infection nor to guide or monitor treatment for MRSA infections.   Culture, Urine     Status: None   Collection Time: 07/19/17  1:20 PM  Result Value Ref Range Status    Specimen Description URINE, CATHETERIZED  Final   Special Requests NONE  Final   Culture NO GROWTH  Final   Report Status 07/20/2017 FINAL  Final  Culture, Urine     Status: Abnormal   Collection Time: 07/20/17  1:05 PM  Result Value Ref Range Status   Specimen Description URINE, CATHETERIZED  Final   Special Requests NONE  Final   Culture 30,000 COLONIES/mL YEAST (A)  Final   Report Status 07/21/2017 FINAL  Final    Radiology Studies: Dg Chest 2 View  Result Date: 07/21/2017 CLINICAL DATA:  Pleural effusion. History of coronary artery disease, hypertension. EXAM: CHEST  2 VIEW COMPARISON:  Chest x-rays dated 07/18/2017, 07/15/2017 and 12/16/2012. FINDINGS: Small right pleural effusion. Persistent elevation of the right hemidiaphragm, likely with overlying atelectasis. Left lung appears clear. Heart size and mediastinal contours are stable. Large hiatal hernia better demonstrated on earlier chest x-rays. Percutaneous drainage catheter underlies the right hemidiaphragm. IMPRESSION: Small right pleural effusion, with probable adjacent atelectasis. Electronically Signed   By: Bary Richard M.D.   On: 07/21/2017 18:29   Dg Chest Port 1 View  Result Date: 07/22/2017 CLINICAL DATA:  Shortness of breath. History of coronary artery disease and hypertension. EXAM: PORTABLE CHEST 1 VIEW COMPARISON:  Chest x-rays dated 07/21/2017 and 07/15/2017.  FINDINGS: Mild cardiomegaly is stable. Overall cardiomediastinal silhouette is stable in size and configuration. Atherosclerotic calcifications noted at the aortic arch. Small right pleural effusion and/or atelectasis is stable. Left lung remains clear. Percutaneous drainage catheter underlying the right hemidiaphragm. IMPRESSION: 1. Stable chest x-ray. Small right pleural effusion and/or atelectasis. No evidence of pneumonia or pulmonary edema. 2. Aortic atherosclerosis. Electronically Signed   By: Bary RichardStan  Maynard M.D.   On: 07/22/2017 07:41   Scheduled Meds: .  allopurinol  300 mg Oral QODAY  . chlorhexidine  15 mL Mouth Rinse BID  . collagenase   Topical Daily  . [START ON 07/24/2017] heparin  5,000 Units Subcutaneous Q8H  . mouth rinse  15 mL Mouth Rinse q12n4p  . metoprolol succinate  12.5 mg Oral Daily  . multivitamin with minerals  1 tablet Oral Daily  . pantoprazole (PROTONIX) IV  40 mg Intravenous Q12H  . sodium chloride flush  5 mL Intravenous Q8H  . thiamine  100 mg Oral Daily   Or  . thiamine  100 mg Intravenous Daily   Continuous Infusions: . dextrose 5 % and 0.45% NaCl 50 mL/hr (07/22/17 1545)  . piperacillin-tazobactam (ZOSYN)  IV 2.25 g (07/23/17 0926)    LOS: 8 days   Merlene Laughtermair Latif Gabrien Mentink, DO [facility] Pager [phone]  If 7PM-7AM, please contact night-coverage [email or web address] Password [email or web address] 07/23/2017, 2:42 PM

## 2017-07-23 NOTE — Progress Notes (Signed)
S:71 year old gentleman with hypertension, alcoholic liver cirrhosis, HTN and CKD presented with generalized weakness, found to have had a perihepatic abscess with duodenal perforation, which was drained and worsening of renal function.  Patient was feeling better and same this morning, he had 4 bloody bowel movements last night. Appears little pale. Renal function improving, drained with some brownish discharge with fecal smell. He was going for IR procedure to rule out any fistula today.   O:BP (!) 145/64 (BP Location: Right Arm)   Pulse 67   Temp 97.8 F (36.6 C) (Oral)   Resp 15   Ht 5\' 10"  (1.778 m)   Wt 156 lb 4.9 oz (70.9 kg)   SpO2 94%   BMI 22.43 kg/m   Intake/Output Summary (Last 24 hours) at 07/23/17 1429 Last data filed at 07/23/17 0800  Gross per 24 hour  Intake          1286.67 ml  Output              865 ml  Net           421.67 ml   Intake/Output: I/O last 3 completed shifts: In: 1241.7 [I.V.:791.7; IV Piggyback:450] Out: 2165 [Urine:1950; Drains:215]  Intake/Output this shift:  Total I/O In: 150 [I.V.:150] Out: -  Weight change:   Gen: well-developed gentleman,A & O X 2,in no acute distress. CVS: regular rate and rhythm.  Resp: few basal crackles. Abd: soft, mild diffuse tenderness,RUQ drain with brownish discharge with fecal smell. Ext: 1+ LE  edema.  Recent Labs Lab 07/17/17 0425 07/18/17 1330 07/19/17 0548 07/20/17 0400 07/20/17 1911 07/21/17 0453 07/22/17 0327 07/23/17 0559  NA 137 141 144 147* 145 147*  146* 146*  146* 146*  K 5.5* 4.8 3.4* 2.9* 3.2* 2.9*  2.9* 3.5  3.4* 3.9  CL 112* 109 103 104 104 105  103 105  104 109  CO2 12* 14* 19* 24 25 25  24 23  22 22   GLUCOSE 82 84 95 97 94 92  91 77  78 96  BUN 91* 101* 98* 90* 83* 79*  80* 75*  75* 68*  CREATININE 3.97* 4.29* 4.35* 4.25* 4.04* 4.08*  4.09* 4.05*  4.03* 3.81*  ALBUMIN 1.9* 2.0* 1.6* 1.4*  --  1.4*  1.3* 1.5*  1.5* 1.4*  CALCIUM 8.5* 8.8* 8.2* 8.3* 8.3* 8.4*   8.3* 8.6*  8.5* 8.3*  PHOS 7.5* 6.4* 5.8* 5.3*  --  5.2*  5.3* 4.9*  5.0* 4.6  AST  --  25 19 17   --  15 15 19   ALT  --  16* 15* 13*  --  12* 12* 10*   Liver Function Tests:  Recent Labs Lab 07/21/17 0453 07/22/17 0327 07/23/17 0559  AST 15 15 19   ALT 12* 12* 10*  ALKPHOS 52 53 48  BILITOT 1.2 1.3* 1.5*  PROT 4.9* 5.8* 5.3*  ALBUMIN 1.4*  1.3* 1.5*  1.5* 1.4*   No results for input(s): LIPASE, AMYLASE in the last 168 hours.  Recent Labs Lab 07/18/17 1640 07/19/17 0548  AMMONIA 52* 29   CBC:  Recent Labs Lab 07/19/17 1147 07/20/17 0400  07/21/17 0453 07/22/17 0327 07/22/17 2335 07/23/17 0903  WBC 8.7 8.0  --  6.1 5.5  --  5.6  NEUTROABS 6.5 6.0  --  4.4 3.7  --  3.7  HGB 7.7* 7.2*  < > 8.0* 9.2* 8.7* 8.5*  HCT 22.4* 21.4*  < > 23.9* 28.9* 27.3* 26.7*  MCV 88.2 89.5  --  90.2 92.9  --  93.7  PLT 249 250  --  183 188  --  186  < > = values in this interval not displayed. Cardiac Enzymes: No results for input(s): CKTOTAL, CKMB, CKMBINDEX, TROPONINI in the last 168 hours. CBG:  Recent Labs Lab 07/20/17 1135  GLUCAP 102*    Iron Studies: No results for input(s): IRON, TIBC, TRANSFERRIN, FERRITIN in the last 72 hours. Studies/Results: Dg Chest 2 View  Result Date: 07/21/2017 CLINICAL DATA:  Pleural effusion. History of coronary artery disease, hypertension. EXAM: CHEST  2 VIEW COMPARISON:  Chest x-rays dated 07/18/2017, 07/15/2017 and 12/16/2012. FINDINGS: Small right pleural effusion. Persistent elevation of the right hemidiaphragm, likely with overlying atelectasis. Left lung appears clear. Heart size and mediastinal contours are stable. Large hiatal hernia better demonstrated on earlier chest x-rays. Percutaneous drainage catheter underlies the right hemidiaphragm. IMPRESSION: Small right pleural effusion, with probable adjacent atelectasis. Electronically Signed   By: Bary Richard M.D.   On: 07/21/2017 18:29   Dg Chest Port 1 View  Result Date:  07/22/2017 CLINICAL DATA:  Shortness of breath. History of coronary artery disease and hypertension. EXAM: PORTABLE CHEST 1 VIEW COMPARISON:  Chest x-rays dated 07/21/2017 and 07/15/2017. FINDINGS: Mild cardiomegaly is stable. Overall cardiomediastinal silhouette is stable in size and configuration. Atherosclerotic calcifications noted at the aortic arch. Small right pleural effusion and/or atelectasis is stable. Left lung remains clear. Percutaneous drainage catheter underlying the right hemidiaphragm. IMPRESSION: 1. Stable chest x-ray. Small right pleural effusion and/or atelectasis. No evidence of pneumonia or pulmonary edema. 2. Aortic atherosclerosis. Electronically Signed   By: Bary Richard M.D.   On: 07/22/2017 07:41   . allopurinol  300 mg Oral QODAY  . chlorhexidine  15 mL Mouth Rinse BID  . collagenase   Topical Daily  . [START ON 07/24/2017] heparin  5,000 Units Subcutaneous Q8H  . lidocaine      . mouth rinse  15 mL Mouth Rinse q12n4p  . metoprolol succinate  12.5 mg Oral Daily  . multivitamin with minerals  1 tablet Oral Daily  . pantoprazole (PROTONIX) IV  40 mg Intravenous Q12H  . sodium chloride flush  5 mL Intravenous Q8H  . thiamine  100 mg Oral Daily   Or  . thiamine  100 mg Intravenous Daily    BMET    Component Value Date/Time   NA 146 (H) 07/23/2017 0559   K 3.9 07/23/2017 0559   CL 109 07/23/2017 0559   CO2 22 07/23/2017 0559   GLUCOSE 96 07/23/2017 0559   BUN 68 (H) 07/23/2017 0559   CREATININE 3.81 (H) 07/23/2017 0559   CALCIUM 8.3 (L) 07/23/2017 0559   GFRNONAA 15 (L) 07/23/2017 0559   GFRAA 17 (L) 07/23/2017 0559   CBC    Component Value Date/Time   WBC 5.6 07/23/2017 0903   RBC 2.85 (L) 07/23/2017 0903   HGB 8.5 (L) 07/23/2017 0903   HCT 26.7 (L) 07/23/2017 0903   PLT 186 07/23/2017 0903   MCV 93.7 07/23/2017 0903   MCH 29.8 07/23/2017 0903   MCHC 31.8 07/23/2017 0903   RDW 14.0 07/23/2017 0903   LYMPHSABS 1.1 07/23/2017 0903   MONOABS 0.8  07/23/2017 0903   EOSABS 0.0 07/23/2017 0903   BASOSABS 0.0 07/23/2017 0903     Assessment/Plan:  AKI. Creatinine improving, With low-dose IV fluid. -Keep monitoring renal function.  Hypokalemia. Resolved, potassium currently stable.  Abdominal abscess ?perforation.he had IR procedure today to rule out fistula which was not  found. Had 4 bloody bowel movements. Surgery is following but still does not think that he is a good candidate for surgery. Continued to have brownish fecal smelling discharge from drain.  -continue monitoring and antibiotics.  Anemia. Hemoglobin Dropped to 8.5 today. He had 4 bloody bowel movements last night. -Continue monitoring CBC and transfuse if needed.  Hypertension. Blood pressure improved. -keep monitoring, avoid hypotension.  Alcohol abuse. Continue CIWAprotocol. NOT CLEAR has signif liver disease  Encephalopathy .improving  CAD: currently stable.  Jonathan Walker

## 2017-07-23 NOTE — Progress Notes (Signed)
Physical Therapy Treatment Patient Details Name: Jonathan Walker MRN: [id number] DOB: 22-Mar-1946 Today's Date: 07/23/2017    History of Present Illness Pt adm to [facility] wtih weakness and found to have AKI. Pt also found to have large perihepatic intraabdominal abscess. Drain placed on 10/24 by IR on PMH - CAD status post CABG, htn, gout, alcoholic liver cirrhosis    PT Comments    Pt making progress with mobility. Continue to recommend SNF at [place].   Follow Up Recommendations  SNF;Supervision/Assistance - 24 hour     Equipment Recommendations  Other (comment) (To be assessed at next venue)    Recommendations for Other Services       Precautions / Restrictions Precautions Precautions: Fall Restrictions Weight Bearing Restrictions: No    Mobility  Bed Mobility Overal bed mobility: Needs Assistance Bed Mobility: Rolling Rolling: Max assist            Transfers Overall transfer level: Needs assistance Equipment used: 1 person hand held assist Transfers: Sit to/from Stand Sit to Stand: Max assist         General transfer comment: Assist to bring hips and trunk up using bil shelf arm support. Pt stood from foot of bed with bed in chair position. On first attempt pt achieved ~2/3 full stand. On second attempt stood 90% of full stand  Ambulation/Gait             General Gait Details: Unable   Information systems manager [name] (Stroke Patients Only)       Balance Overall balance assessment: Needs assistance Sitting-balance support: Feet supported;Bilateral upper extremity supported Sitting balance-Leahy Scale: Poor Sitting balance - Comments: Able to maintain sitting forward from chair position at end of bed with supervision and UE support   Standing balance support: Bilateral upper extremity supported Standing balance-Leahy Scale: Zero Standing balance comment: Heavy mod assist to maintaing standing with posterior lean                             Cognition Arousal/Alertness: Awake/alert Behavior During Therapy: WFL for tasks assessed/performed Overall Cognitive Status: Impaired/Different from baseline Area of Impairment: Attention;Memory;Following commands;Safety/judgement;Problem solving                 Orientation Level: Disoriented to;Place;Time;Situation Current Attention Level: Sustained Memory: Decreased recall of precautions;Decreased short-term memory Following Commands: Follows one step commands consistently Safety/Judgement: Decreased awareness of safety;Decreased awareness of deficits   Problem Solving: Slow processing;Requires verbal cues;Requires tactile cues;Decreased initiation General Comments: Pt more responsive to questions and commands than last session.      Exercises      General Comments        Pertinent Vitals/Pain Pain Assessment: No/denies pain    Home Living                      Prior Function            PT Goals (current goals can now be found in the care plan section) Progress towards PT goals: Progressing toward goals    Frequency    Min 3X/week      PT Plan Current plan remains appropriate    Co-evaluation              AM-PAC PT "6 Clicks" Daily Activity  Outcome Measure  Difficulty turning over in bed (including adjusting  bedclothes, sheets and blankets)?: Unable Difficulty moving from lying on back to sitting on the side of the bed? : Unable Difficulty sitting down on and standing up from a chair with arms (e.g., wheelchair, bedside commode, etc,.)?: Unable Help needed moving to and from a bed to chair (including a wheelchair)?: Total Help needed walking in hospital room?: Total Help needed climbing 3-5 steps with a railing? : Total 6 Click Score: 6    End of Session Equipment Utilized During Treatment: Gait belt Activity Tolerance: Patient limited by fatigue Patient left: in bed;with call bell/phone within  reach;with family/visitor present;Other (comment) (MD present) Nurse Communication: Mobility status PT Visit Diagnosis: Unsteadiness on feet (R26.81);Other abnormalities of gait and mobility (R26.89);Muscle weakness (generalized) (M62.81)     Time: [id number] PT Time Calculation (min) (ACUTE ONLY): 35 min  Charges:  $Therapeutic Activity: 23-37 mins                    G Codes:       [facility] Shai Rasmussen PT [phone]    Angelina OkCary W [facility] Hospital[facility]cok 07/23/2017, 5:46 PM

## 2017-07-23 NOTE — Procedures (Signed)
Interventional Radiology Procedure Note  Procedure: Injection of right UQ abscess drain.    Findings: No fistula identified.  Drain is working and patent.    Complications: None Recommendations:  - Continue to gravity drain - At least once per day sterile saline flush.  - Do not submerge - Routine drain and wound care   Signed,  Yvone NeuJaime S. Loreta AveWagner, DO
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
## 2017-07-23 NOTE — Progress Notes (Signed)
SLP Cancellation Note  Patient Details Name: Jonathan Walker MRN: [id number] DOB: 11/10/1945   Cancelled treatment:       Reason Eval/Treat Not Completed: Patient at procedure or test/unavailable. Pt to IR for procedure today. Unsure if POs were ever initiated given difficulty with drain. Will follow for needs.    Bryna Razavi, Riley NearingBonnie Caroline 07/23/2017, 9:17 AM

## 2017-07-23 NOTE — Progress Notes (Signed)
Patient has had 4+ large, bloody stools throughout the evening. Margo AyeHall, MD notified. STAT Hemoglobin ordered.

## 2017-07-23 NOTE — Progress Notes (Signed)
Subjective/Chief Complaint: Drain out put changed per nursing.  Also with bloody BM's He reports minimal abdominal pain. Remains hemodyn stable   Objective: Vital signs in last 24 hours: Temp:  [97.3 F (36.3 C)-98.2 F (36.8 C)] 97.3 F (36.3 C) (10/29 0803) Pulse Rate:  [62-76] 69 (10/29 0803) Resp:  [10-14] 11 (10/29 0803) BP: (136-163)/(70-88) 142/72 (10/29 0803) SpO2:  [95 %-98 %] 96 % (10/29 0803) Last BM Date: 07/22/17  Intake/Output from previous day: 10/28 0701 - 10/29 0700 In: 1136.7 [I.V.:786.7; IV Piggyback:350] Out: 1415 [Urine:1275; Drains:140] Intake/Output this shift: Total I/O In: 150 [I.V.:150] Out: -   Exam: Awake and alert Abdomen soft with right sided tenderness and some guarding Drain thick brown  Lab Results:   Recent Labs  07/21/17 0453 07/22/17 0327 07/22/17 2335  WBC 6.1 5.5  --   HGB 8.0* 9.2* 8.7*  HCT 23.9* 28.9* 27.3*  PLT 183 188  --    BMET  Recent Labs  07/22/17 0327 07/23/17 0559  NA 146*  146* 146*  K 3.5  3.4* 3.9  CL 105  104 109  CO2 23  22 22   GLUCOSE 77  78 96  BUN 75*  75* 68*  CREATININE 4.05*  4.03* 3.81*  CALCIUM 8.6*  8.5* 8.3*   PT/INR No results for input(s): LABPROT, INR in the last 72 hours. ABG No results for input(s): PHART, HCO3 in the last 72 hours.  Invalid input(s): PCO2, PO2  Studies/Results: Dg Chest 2 View  Result Date: 07/21/2017 CLINICAL DATA:  Pleural effusion. History of coronary artery disease, hypertension. EXAM: CHEST  2 VIEW COMPARISON:  Chest x-rays dated 07/18/2017, 07/15/2017 and 12/16/2012. FINDINGS: Small right pleural effusion. Persistent elevation of the right hemidiaphragm, likely with overlying atelectasis. Left lung appears clear. Heart size and mediastinal contours are stable. Large hiatal hernia better demonstrated on earlier chest x-rays. Percutaneous drainage catheter underlies the right hemidiaphragm. IMPRESSION: Small right pleural effusion, with  probable adjacent atelectasis. Electronically Signed   By: Bary RichardStan  Maynard M.D.   On: 07/21/2017 18:29   Dg Chest Port 1 View  Result Date: 07/22/2017 CLINICAL DATA:  Shortness of breath. History of coronary artery disease and hypertension. EXAM: PORTABLE CHEST 1 VIEW COMPARISON:  Chest x-rays dated 07/21/2017 and 07/15/2017. FINDINGS: Mild cardiomegaly is stable. Overall cardiomediastinal silhouette is stable in size and configuration. Atherosclerotic calcifications noted at the aortic arch. Small right pleural effusion and/or atelectasis is stable. Left lung remains clear. Percutaneous drainage catheter underlying the right hemidiaphragm. IMPRESSION: 1. Stable chest x-ray. Small right pleural effusion and/or atelectasis. No evidence of pneumonia or pulmonary edema. 2. Aortic atherosclerosis. Electronically Signed   By: Bary RichardStan  Maynard M.D.   On: 07/22/2017 07:41    Anti-infectives: Anti-infectives    Start     Dose/Rate Route Frequency Ordered Stop   07/18/17 1300  piperacillin-tazobactam (ZOSYN) IVPB 2.25 g     2.25 g 100 mL/hr over 30 Minutes Intravenous Every 6 hours 07/18/17 1245        Assessment/Plan: Intraabdominal abscess adjacent to the liver - drained by IR with >1L of purulent drainage - Drain study today per IR, if fistula present, would con't to treat with drain for control.  - possible infected ascites given suggestion of cirrhosis vs SB perf - no active inflammation of stomach or bowel to suggest perforation - recommend IV abx, IVF, ice chips - no acute surgical intervention - he would be very high risk with his cirrhosis Consider CU consult if GI  bleed suspected  LOS: 8 days    Jonathan Walker A 07/23/2017

## 2017-07-24 ENCOUNTER — Encounter (HOSPITAL_COMMUNITY)
Admission: EM | Disposition: A | Discharge status: Skilled Nursing Facility | Payer: Self-pay | Source: Home / Self Care | Attending: Internal Medicine

## 2017-07-24 DIAGNOSIS — K262 Acute duodenal ulcer with both hemorrhage and perforation: Secondary | ICD-10-CM

## 2017-07-24 LAB — CBC WITH DIFFERENTIAL/PLATELET
Basophils Absolute: 0 10*3/uL (ref 0.0–0.1)
Basophils Relative: 0 %
EOS PCT: 1 %
Eosinophils Absolute: 0.1 10*3/uL (ref 0.0–0.7)
HCT: 23.1 % — ABNORMAL LOW (ref 39.0–52.0)
HEMOGLOBIN: 7.4 g/dL — AB (ref 13.0–17.0)
LYMPHS ABS: 1.1 10*3/uL (ref 0.7–4.0)
LYMPHS PCT: 20 %
MCH: 29.8 pg (ref 26.0–34.0)
MCHC: 32 g/dL (ref 30.0–36.0)
MCV: 93.1 fL (ref 78.0–100.0)
Monocytes Absolute: 0.8 10*3/uL (ref 0.1–1.0)
Monocytes Relative: 15 %
Neutro Abs: 3.5 10*3/uL (ref 1.7–7.7)
Neutrophils Relative %: 64 %
PLATELETS: 186 10*3/uL (ref 150–400)
RBC: 2.48 MIL/uL — AB (ref 4.22–5.81)
RDW: 14.1 % (ref 11.5–15.5)
WBC: 5.5 10*3/uL (ref 4.0–10.5)

## 2017-07-24 LAB — COMPREHENSIVE METABOLIC PANEL
ALK PHOS: 41 U/L (ref 38–126)
ALT: 10 U/L — AB (ref 17–63)
AST: 14 U/L — ABNORMAL LOW (ref 15–41)
Albumin: 1.2 g/dL — ABNORMAL LOW (ref 3.5–5.0)
Anion gap: 9 (ref 5–15)
BILIRUBIN TOTAL: 0.6 mg/dL (ref 0.3–1.2)
BUN: 54 mg/dL — ABNORMAL HIGH (ref 6–20)
CALCIUM: 8.1 mg/dL — AB (ref 8.9–10.3)
CO2: 28 mmol/L (ref 22–32)
CREATININE: 3.4 mg/dL — AB (ref 0.61–1.24)
Chloride: 105 mmol/L (ref 101–111)
GFR, EST AFRICAN AMERICAN: 19 mL/min — AB (ref >60)
GFR, EST NON AFRICAN AMERICAN: 17 mL/min — AB (ref >60)
Glucose, Bld: 127 mg/dL — ABNORMAL HIGH (ref 65–99)
Potassium: 2.6 mmol/L — CL (ref 3.5–5.1)
Sodium: 142 mmol/L (ref 135–145)
TOTAL PROTEIN: 5 g/dL — AB (ref 6.5–8.1)

## 2017-07-24 LAB — MAGNESIUM: MAGNESIUM: 1.5 mg/dL — AB (ref 1.7–2.4)

## 2017-07-24 LAB — PREPARE RBC (CROSSMATCH)

## 2017-07-24 LAB — PHOSPHORUS: PHOSPHORUS: 4.1 mg/dL (ref 2.5–4.6)

## 2017-07-24 SURGERY — EGD (ESOPHAGOGASTRODUODENOSCOPY)
Anesthesia: Monitor Anesthesia Care

## 2017-07-24 MED ORDER — HALOPERIDOL 0.5 MG PO TABS: 0.5000 mg | ORAL_TABLET | ORAL | Status: DC | PRN

## 2017-07-24 MED ORDER — POTASSIUM CHLORIDE 10 MEQ/100ML IV SOLN
10.0000 meq | INTRAVENOUS | Status: AC
  Administered 2017-07-24 (×5): 10 meq via INTRAVENOUS
  Filled 2017-07-24 (×5): qty 100

## 2017-07-24 MED ORDER — HALOPERIDOL LACTATE 5 MG/ML IJ SOLN: 0.5000 mg | INTRAMUSCULAR | Status: DC | PRN

## 2017-07-24 MED ORDER — LORAZEPAM 2 MG/ML IJ SOLN: 1.0000 mg | INTRAMUSCULAR | Status: DC | PRN

## 2017-07-24 MED ORDER — OXYCODONE HCL 5 MG PO TABS: 5.0000 mg | ORAL_TABLET | ORAL | Status: DC | PRN

## 2017-07-24 MED ORDER — ACETAMINOPHEN 650 MG RE SUPP: 650.0000 mg | Freq: Four times a day (QID) | RECTAL | Status: DC | PRN

## 2017-07-24 MED ORDER — LORAZEPAM 1 MG PO TABS: 1.0000 mg | ORAL_TABLET | ORAL | Status: DC | PRN

## 2017-07-24 MED ORDER — SODIUM CHLORIDE 0.9% FLUSH
10.0000 mL | Freq: Two times a day (BID) | INTRAVENOUS | Status: DC
  Administered 2017-07-24 – 2017-07-25 (×2): 10 mL

## 2017-07-24 MED ORDER — ONDANSETRON HCL 4 MG/2ML IJ SOLN: 4.0000 mg | Freq: Four times a day (QID) | INTRAMUSCULAR | Status: DC | PRN

## 2017-07-24 MED ORDER — ACETAMINOPHEN 325 MG PO TABS: 650.0000 mg | ORAL_TABLET | Freq: Four times a day (QID) | ORAL | Status: DC | PRN

## 2017-07-24 MED ORDER — HYDROMORPHONE HCL 1 MG/ML IJ SOLN: 0.5000 mg | INTRAMUSCULAR | Status: DC | PRN

## 2017-07-24 MED ORDER — CHLORHEXIDINE GLUCONATE CLOTH 2 % EX PADS: 6.0000 | MEDICATED_PAD | Freq: Every day | CUTANEOUS | Status: DC

## 2017-07-24 MED ORDER — BIOTENE DRY MOUTH MT LIQD: 15.0000 mL | OROMUCOSAL | Status: DC | PRN

## 2017-07-24 MED ORDER — SODIUM CHLORIDE 0.9 % IV SOLN: Freq: Once | INTRAVENOUS | Status: DC

## 2017-07-24 MED ORDER — GLYCOPYRROLATE 0.2 MG/ML IJ SOLN: 0.2000 mg | INTRAMUSCULAR | Status: DC | PRN

## 2017-07-24 MED ORDER — GLYCOPYRROLATE 1 MG PO TABS: 1.0000 mg | ORAL_TABLET | ORAL | Status: DC | PRN

## 2017-07-24 MED ORDER — HALOPERIDOL LACTATE 2 MG/ML PO CONC: 0.5000 mg | ORAL | Status: DC | PRN

## 2017-07-24 MED ORDER — ONDANSETRON 4 MG PO TBDP: 4.0000 mg | ORAL_TABLET | Freq: Four times a day (QID) | ORAL | Status: DC | PRN

## 2017-07-24 MED ORDER — LORAZEPAM 2 MG/ML PO CONC: 1.0000 mg | ORAL | Status: DC | PRN

## 2017-07-24 MED ORDER — SODIUM CHLORIDE 0.9% FLUSH: 10.0000 mL | INTRAVENOUS | Status: DC | PRN

## 2017-07-24 MED ORDER — POLYVINYL ALCOHOL 1.4 % OP SOLN: 1.0000 [drp] | Freq: Four times a day (QID) | OPHTHALMIC | Status: DC | PRN

## 2017-07-24 NOTE — Progress Notes (Signed)
SLP Cancellation Note  Patient Details Name: Jonathan Walker MRN: [id number] DOB: 05/24/1946   Cancelled treatment:       Reason Eval/Treat Not Completed: Medical issues which prohibited therapy (pt NPO pending possible procedure). Will f/u as able.   Maxcine Hamaiewonsky, Courtnay Petrilla 07/24/2017, 8:44 AM  Maxcine HamLaura Paiewonsky, M.A. CCC-SLP [phone]

## 2017-07-24 NOTE — Progress Notes (Addendum)
Hgb down to 7.9.  1 U PRBC ordered. Potassium 2.6.  5 runs KCl ordered.   Marginal IV access, only 1 line.  Hopefully will tolerate potassium.  Dr Marland McalpineSheikh planning PICC line today.    Still passing burgundy liquid blood into Flexiseal but no blood at perc site or from perc drain which is entirely empty.    Tentatively on for EGD for Eval of GI bleed this afternoon but may need delayed until tomorrow.    Jennye MoccasinSarah Syvilla Martin PA-C [phone] pager.

## 2017-07-24 NOTE — Progress Notes (Signed)
Spoke to patient's nurse re: Am labs.  She was about to call [facility] doc for a critical potassium of 2.9, that will need substantial repletion if the patient is to have an EGD today. I also informed nurse I discontinued SQ heparin and ordered SCDs since there is GI bleeding,  I also asked her to tell them he needs to be transfused a unit of PRBCs today for Hgb 7.4 with active GI bleeding.  Given the patient's poor IV access yesterday, it is uncertain if all this will be feasible in time to allow an EGD today.  Strong consideration should be given to a central line in this patient.  We will check back later this morning to see where things stand for possible EGD today.  Amada JupiterHenry Danis , MD [facility]

## 2017-07-24 NOTE — Progress Notes (Signed)
CRITICAL VALUE ALERT  Critical Value:  Potassium 2.6   Date & Time Notified:  07/24/2017 [id number]  Provider Notified: Tama GanderKatherine Schorr  Orders Received/Actions taken:  yes

## 2017-07-24 NOTE — Progress Notes (Signed)
   Subjective/Chief Complaint: A little better, tol clears, wife reports melena in Flexiseal   Objective: Vital signs in last 24 hours: Temp:  [96.9 F (36.1 C)-98.1 F (36.7 C)] 98 F (36.7 C) (10/30 0740) Pulse Rate:  [62-81] 66 (10/29 1900) Resp:  [9-16] 13 (10/30 0740) BP: (128-156)/(59-83) 155/72 (10/30 0740) SpO2:  [94 %-99 %] 99 % (10/30 0500) Last BM Date: 07/23/17  Intake/Output from previous day: 10/29 0701 - 10/30 0700 In: 2040 [P.O.:740; I.V.:1050; IV Piggyback:250] Out: 1165 [Urine:1150; Drains:15] Intake/Output this shift: No intake/output data recorded.  General appearance: cooperative Resp: clear to auscultation bilaterally Cardio: regular rate and rhythm GI: soft, drain output much less, +BS, NT  Lab Results:   Recent Labs  07/23/17 0903 07/24/17 0435  WBC 5.6 5.5  HGB 8.5* 7.4*  HCT 26.7* 23.1*  PLT 186 186   BMET  Recent Labs  07/23/17 0559 07/24/17 0435  NA 146* 142  K 3.9 2.6*  CL 109 105  CO2 22 28  GLUCOSE 96 127*  BUN 68* 54*  CREATININE 3.81* 3.40*  CALCIUM 8.3* 8.1*   PT/INR No results for input(s): LABPROT, INR in the last 72 hours. ABG No results for input(s): PHART, HCO3 in the last 72 hours.  Invalid input(s): PCO2, PO2  Studies/Results: Ir Sinus/fist Tube Chk-non Gi  Result Date: 07/23/2017 INDICATION: 71 year old male with a history of right upper quadrant abscess. EXAM: IMAGE GUIDED EXISTING DRAIN INJECTION MEDICATIONS: NONE ANESTHESIA/SEDATION: NONE COMPLICATIONS: None PROCEDURE: Informed written consent was obtained from the patient after a thorough discussion of the procedural risks, benefits and alternatives. All questions were addressed. Maximal Sterile Barrier Technique was utilized including caps, mask, sterile gowns, sterile gloves, sterile drape, hand hygiene and skin antiseptic. A timeout was performed prior to the initiation of the procedure. Patient positioned supine position on the fluoroscopy table. The  patient was prepped and draped in the usual sterile fashion. Scout images of the upper abdomen performed. Under fluoroscopy, the existing right upper quadrant drain was injected confirming no damage of the drain catheter, an the drain catheter positioned well within the right upper quadrant on the surface of the liver. Contrast accumulated on the surface of the liver and in the right pericolic gutter. No evidence of fistula to the GI system or to the biliary system. Copious sterile saline flush was then flushed through the catheter, which was attached to gravity drainage. FINDINGS: Contrast injection demonstrates functional drainage catheter of the right upper quadrant with no evidence of fistula to the biliary system or the GI system. IMPRESSION: Status post injection of existing right upper quadrant abscess drain, demonstrating functional abscess drain and no evidence of fistula to the GI or biliary system. Signed, Yvone NeuJaime S. Loreta AveWagner, DO [facility] Electronically Signed   By: Gilmer MorJaime  Wagner D.O.   On: 07/23/2017 15:09    Anti-infectives: Anti-infectives    Start     Dose/Rate Route Frequency Ordered Stop   07/18/17 1300  piperacillin-tazobactam (ZOSYN) IVPB 2.25 g     2.25 g 100 mL/hr over 30 Minutes Intravenous Every 6 hours 07/18/17 1245        Assessment/Plan: Intraabdominal abscess adjacent to the liver - drained by IR with >1L of purulent drainage - Drain study per IR 10/29 no fistula - endoscopy by GI today to eval melena source - Noted plan for TF - I spoke with his family  Geraldene Eisel E 07/24/2017

## 2017-07-24 NOTE — Clinical Social Work Note (Signed)
Clinical Social Work Assessment  Patient Details  Name: Jonathan Walker MRN: [id number] Date of Birth: 01/03/1946  Date of referral:  07/24/17               Reason for consult:  Facility Placement                Permission sought to share information with:  Oceanographeracility Contact Representative Permission granted to share information::  Yes, Verbal Permission Granted  Name::     Glass blower/designerHelen  Agency::  SNF  Relationship::  Surveyor, miningex-wife/caregiver  Contact Information:     Housing/Transportation Living arrangements for the past 2 months:  Single Family Home Source of Information:  Adult Children, Other (Comment Required) (ex-wife) Patient Interpreter Needed:  None Criminal Activity/Legal Involvement Pertinent to Current Situation/Hospitalization:  No - Comment as needed Significant Relationships:  Adult Children, Other(Comment) (ex-wife) Lives with:  Other (Comment) (ex-wife) Do you feel safe going back to the place where you live?  No Need for family participation in patient care:  Yes (Comment) (decision making)  Care giving concerns:  Pt lives at home with ex-wife whom has been his primary caregiver for 4 years.  Pt ex-wife is elderly and unable to accommodate pt current level of physical needs.   Social Worker assessment / plan:  CSW spoke with pt exwife, and dtr-in-law at bedside concerning DC plan.  Tried to engage pt but he did not respond to questioning and was quiet throughout interview though his eyes were open.  CSW explained PT recommendation for SNF.  Pt family familiar with SNF as patient has been several times over the years.  Employment status:  Retired Health and safety inspectornsurance information:  Armed forces operational officerMedicare, [place]  Skilled Nursing Facility Information / Referral to community resources:  Skilled Nursing Facility  Patient/Family's Response to care:  Pt family agreeable to SNF placement when pt is stable for SNF and thinks pt might need some kind of long term placement following  rehab.  Patient/Family's Understanding of and Emotional Response to Diagnosis, Current Treatment, and Prognosis:  Pt family seems to have good understanding of pt current needs and anticipates higher level of needs moving forward- hopeful pt will improve but understands he has a long way left to go.  Emotional Assessment Appearance:  Appears stated age Attitude/Demeanor/Rapport:  Sedated Affect (typically observed):  Quiet Orientation:  Oriented to Self, Oriented to Place, Oriented to  Time Alcohol / Substance use:  Not Applicable Psych involvement (Current and /or in the community):  No (Comment)  Discharge Needs  Concerns to be addressed:  Care Coordination Readmission within the last 30 days:  No Current discharge risk:  Physical Impairment Barriers to Discharge:  Continued Medical Work up   Burna SisUris, Priscillia Fouch H, LCSW 07/24/2017, 12:44 PM

## 2017-07-24 NOTE — Progress Notes (Signed)
S: 71 year old gentleman with hypertension, alcoholic liver cirrhosis, HTN and CKD presented with generalized weakness, found to have had a perihepatic abscess with duodenal perforation, which was drained and worsening of renal function.  He was feeling little better, awake, able to answer appropriately. Still having significant melonotic BM in flexiseal.  Creatinine improving, hemoglobin decreased, will get p RBC today before EGD.  O:BP (!) 163/72   Pulse 69   Temp 98.1 F (36.7 C) (Oral)   Resp 13   Ht 5\' 10"  (1.778 m)   Wt 156 lb 4.9 oz (70.9 kg)   SpO2 100%   BMI 22.43 kg/m   Intake/Output Summary (Last 24 hours) at 07/24/17 1437 Last data filed at 07/24/17 1402  Gross per 24 hour  Intake             2155 ml  Output              815 ml  Net             1340 ml   Intake/Output: I/O last 3 completed shifts: In: 2976.7 [P.O.:740; I.V.:1686.7; IV Piggyback:550] Out: 2010 [Urine:1875; Drains:135]  Intake/Output this shift:  Total I/O In: 315 [Blood:315] Out: -  Weight change:   Gen: well-developed gentleman,in no acute distress. CVS: regular rate and rhythm.  Resp: few basal crackles. Abd: soft, mild diffuse tenderness,sounds positive, RUQ drain. Ext: Trace LE edema.  Recent Labs Lab 07/18/17 1330 07/19/17 0548 07/20/17 0400 07/20/17 1911 07/21/17 0453 07/22/17 0327 07/23/17 0559 07/24/17 0435  NA 141 144 147* 145 147*  146* 146*  146* 146* 142  K 4.8 3.4* 2.9* 3.2* 2.9*  2.9* 3.5  3.4* 3.9 2.6*  CL 109 103 104 104 105  103 105  104 109 105  CO2 14* 19* 24 25 25  24 23  22 22 28   GLUCOSE 84 95 97 94 92  91 77  78 96 127*  BUN 101* 98* 90* 83* 79*  80* 75*  75* 68* 54*  CREATININE 4.29* 4.35* 4.25* 4.04* 4.08*  4.09* 4.05*  4.03* 3.81* 3.40*  ALBUMIN 2.0* 1.6* 1.4*  --  1.4*  1.3* 1.5*  1.5* 1.4* 1.2*  CALCIUM 8.8* 8.2* 8.3* 8.3* 8.4*  8.3* 8.6*  8.5* 8.3* 8.1*  PHOS 6.4* 5.8* 5.3*  --  5.2*  5.3* 4.9*  5.0* 4.6 4.1  AST 25 19 17   --   15 15 19  14*  ALT 16* 15* 13*  --  12* 12* 10* 10*   Liver Function Tests:  Recent Labs Lab 07/22/17 0327 07/23/17 0559 07/24/17 0435  AST 15 19 14*  ALT 12* 10* 10*  ALKPHOS 53 48 41  BILITOT 1.3* 1.5* 0.6  PROT 5.8* 5.3* 5.0*  ALBUMIN 1.5*  1.5* 1.4* 1.2*   No results for input(s): LIPASE, AMYLASE in the last 168 hours.  Recent Labs Lab 07/18/17 1640 07/19/17 0548  AMMONIA 52* 29   CBC:  Recent Labs Lab 07/20/17 0400  07/21/17 0453 07/22/17 0327 07/22/17 2335 07/23/17 0903 07/24/17 0435  WBC 8.0  --  6.1 5.5  --  5.6 5.5  NEUTROABS 6.0  --  4.4 3.7  --  3.7 3.5  HGB 7.2*  < > 8.0* 9.2* 8.7* 8.5* 7.4*  HCT 21.4*  < > 23.9* 28.9* 27.3* 26.7* 23.1*  MCV 89.5  --  90.2 92.9  --  93.7 93.1  PLT 250  --  183 188  --  186 186  < > =  values in this interval not displayed. Cardiac Enzymes: No results for input(s): CKTOTAL, CKMB, CKMBINDEX, TROPONINI in the last 168 hours. CBG:  Recent Labs Lab 07/20/17 1135  GLUCAP 102*    Iron Studies: No results for input(s): IRON, TIBC, TRANSFERRIN, FERRITIN in the last 72 hours. Studies/Results: Ir Sinus/fist Tube Chk-non Gi  Result Date: 07/23/2017 INDICATION: 71 year old male with a history of right upper quadrant abscess. EXAM: IMAGE GUIDED EXISTING DRAIN INJECTION MEDICATIONS: NONE ANESTHESIA/SEDATION: NONE COMPLICATIONS: None PROCEDURE: Informed written consent was obtained from the patient after a thorough discussion of the procedural risks, benefits and alternatives. All questions were addressed. Maximal Sterile Barrier Technique was utilized including caps, mask, sterile gowns, sterile gloves, sterile drape, hand hygiene and skin antiseptic. A timeout was performed prior to the initiation of the procedure. Patient positioned supine position on the fluoroscopy table. The patient was prepped and draped in the usual sterile fashion. Scout images of the upper abdomen performed. Under fluoroscopy, the existing right upper  quadrant drain was injected confirming no damage of the drain catheter, an the drain catheter positioned well within the right upper quadrant on the surface of the liver. Contrast accumulated on the surface of the liver and in the right pericolic gutter. No evidence of fistula to the GI system or to the biliary system. Copious sterile saline flush was then flushed through the catheter, which was attached to gravity drainage. FINDINGS: Contrast injection demonstrates functional drainage catheter of the right upper quadrant with no evidence of fistula to the biliary system or the GI system. IMPRESSION: Status post injection of existing right upper quadrant abscess drain, demonstrating functional abscess drain and no evidence of fistula to the GI or biliary system. Signed, Yvone Neu. Loreta Ave, DO [facility] Electronically Signed   By: Gilmer Mor D.O.   On: 07/23/2017 15:09   . allopurinol  300 mg Oral QODAY  . chlorhexidine  15 mL Mouth Rinse BID  . Chlorhexidine Gluconate Cloth  6 each Topical Daily  . collagenase   Topical Daily  . mouth rinse  15 mL Mouth Rinse q12n4p  . metoprolol succinate  12.5 mg Oral Daily  . multivitamin with minerals  1 tablet Oral Daily  . pantoprazole (PROTONIX) IV  40 mg Intravenous Q12H  . sodium chloride flush  10-40 mL Intracatheter Q12H  . sodium chloride flush  5 mL Intravenous Q8H  . thiamine  100 mg Oral Daily   Or  . thiamine  100 mg Intravenous Daily    BMET    Component Value Date/Time   NA 142 07/24/2017 0435   K 2.6 (LL) 07/24/2017 0435   CL 105 07/24/2017 0435   CO2 28 07/24/2017 0435   GLUCOSE 127 (H) 07/24/2017 0435   BUN 54 (H) 07/24/2017 0435   CREATININE 3.40 (H) 07/24/2017 0435   CALCIUM 8.1 (L) 07/24/2017 0435   GFRNONAA 17 (L) 07/24/2017 0435   GFRAA 19 (L) 07/24/2017 0435   CBC    Component Value Date/Time   WBC 5.5 07/24/2017 0435   RBC 2.48 (L) 07/24/2017 0435   HGB 7.4  (L) 07/24/2017 0435   HCT 23.1 (L) 07/24/2017 0435   PLT 186 07/24/2017 0435   MCV 93.1 07/24/2017 0435   MCH 29.8 07/24/2017 0435   MCHC 32.0 07/24/2017 0435   RDW 14.1 07/24/2017 0435   LYMPHSABS 1.1 07/24/2017 0435   MONOABS 0.8 07/24/2017 0435   EOSABS 0.1 07/24/2017 0435   BASOSABS 0.0 07/24/2017 0435  Assessment/Plan:  AKI. Creatinine improving.continue monitoring and gentle IV fluid.  Hypokalemia. Potassium at 2.6 today, she placed with KCl IV 10 mEq 6. -Monitor electrolytes.  Abdominal abscess ?perforation. IR and surgery is following.decreased discharge in drain. -CT abdomen was ordered to evaluate abscess. -Continue monitoring and antibiotics.  Anemia. Hemoglobin Dropped to 7.4 today, having significant amount of melena . - blood transfusion today. - will have his EGD after transfusion. -continue monitoring.  Hypertension. Blood pressure improved. -keep monitoring, avoid hypotension.  Alcohol abuse. Continue CIWAprotocol. NOT CLEAR has signif liver disease  Encephalopathy .improving  CAD: currently stable.  Arnetha Courser

## 2017-07-24 NOTE — Care Management Important Message (Signed)
Important Message  Patient Details  Name: Jonathan RiderWayne A Gabler MRN: [id number] Date of Birth: 01/29/1946   Medicare Important Message Given:  Yes    Kortney Schoenfelder Abena 07/24/2017, 9:19 AM

## 2017-07-24 NOTE — Progress Notes (Signed)
Patient had family visiting and pastor from church.  I introduced myself and patient asked how to contact me when her pastor goes home and isn't available.  I shared to request nurse call chaplain and chaplain on call would be able to come for visit. Jonathan Walker, Chaplain   07/24/17 1400  Clinical Encounter Type  Visited With Patient and family together  Visit Type Initial  Referral From Nurse  Spiritual Encounters  Spiritual Needs Other (Comment)  Stress Factors  Patient Stress Factors None identified  Family Stress Factors None identified

## 2017-07-24 NOTE — Progress Notes (Addendum)
[facility] Progress Note  Chief Complaint: Melena  Subjective  History:  It has been an eventful day for this patient today. He still gives little or no history as before, and his mental status still seems altered. While he is alert, and knows he is in the hospital, he otherwise appears confused and can give no helpful history. Earlier this morning, he was found to have had a substantial drop in hemoglobin and had a low potassium level as well. He is almost finished receiving 1 unit of PRBCs, received a PICC line, and he is in the midst of receiving aggressive potassium replacement. He continues to pass burgundy stool in the rectal collection bag, but also in his abdominal drain and around the drain as well. At the time of my evaluation, he has dark clotted blood that has passed around the abdominal drainage catheter.  Review of systems: Unable to obtain as noted above due to patient's altered mental status  Objective:  Med list reviewed  Vital signs in last 24 hrs: Vitals:   07/24/17 1130 07/24/17 1402  BP: (!) 146/76 (!) 163/72  Pulse:  69  Resp:  13  Temp: 97.7 F (36.5 C) 98.1 F (36.7 C)  SpO2:      Physical Exam Chronically ill-appearing man, alert, no apparent distress.    HEENT: sclera anicteric, oral mucosa moist without lesions  Neck: supple, no thyromegaly, JVD or lymphadenopathy  Cardiac: RRR without murmurs, S1S2 heard, no peripheral edema  Pulm: clear to auscultation bilaterally, low inspiratory effort  Abdomen: soft, significant right-sided tenderness, with active bowel sounds. Right-sided abdominal drain with findings as noted above Skin; warm and dry, no jaundice or rash, pale Recent Labs:   Recent Labs Lab 07/22/17 0327 07/22/17 2335 07/23/17 0903 07/24/17 0435  WBC 5.5  --  5.6 5.5  HGB 9.2* 8.7* 8.5* 7.4*  HCT 28.9* 27.3* 26.7* 23.1*  PLT 188  --  186 186    Recent Labs Lab 07/24/17 0435  NA 142  K 2.6*  CL 105  CO2 28  BUN 54*   ALBUMIN 1.2*  ALKPHOS 41  ALT 10*  AST 14*  GLUCOSE 127*    Recent Labs Lab 07/18/17 1640  INR 1.21    @ASSESSMENTPLANBEGIN @ Assessment: Melena Acute blood loss anemia Hollow viscus perforation of GI tract, location unknown but suspected to be upper.  Despite his stable vital signs, this patient remains critically ill. I suspect he has a freely perforated duodenal ulcer, with bleeding both into the peritoneum and the lumen of his GI tract. I have had multiple discussions with this patient's family as well as surgical consultants. As of yesterday, I was under the impression that the patient, while certainly at high risk for operative intervention, might still be considered for that. My discussion with Dr. Janee Morn today indicates that this patient is not considered to be a surgical candidate at all. As such, there is no point in doing an upper endoscopy. The procedure would be high risk with no benefit. Even if I could identify the source of this problem, air insufflation from the scope could very well make him worse. In addition, there is little chance I can stop the bleeding, and certainly no chance that I could offer an endoscopic solution to a perforated viscus. Therefore, findings on upper endoscopy would still require surgery for definitive management. If surgery is not being offered, then it seems the patient should be transferred to comfort care.  I have made this clear to  his family during a long discussion. I spoke with his ex-wife and a daughter-in-law. The daughter-in-law conveyed these thoughts to her husband Thayer OhmChris, the patient's son. Thayer OhmChris and his brother Gerda DissDwayne are apparently heading into the hospital soon. I will do my best to be available to speak with them, and I have updated Dr. Janee Mornhompson as well. We will update the hospital physician since I they will need to have a discussion regarding de-escalation of care.  Total time 55 minutes today face-to-face,  with multiple visits  to the patient's room, and over half spent in discussions with family and care team.  Charlie PitterHenry L Danis III Pager [phone] Mon-Fri 8a-5p [phone] after 5p, weekends, holidays

## 2017-07-24 NOTE — Progress Notes (Signed)
Patient ID: Jonathan Walker, male   DOB: 1946-07-29, 70 y.o.   MRN: [id number]    Referring Physician(s): Dr. Renae Fickle  Supervising Physician: Ruel Favors  Patient Status: [facility] - In-pt  Chief Complaint: Intra-abdominal abscess  Subjective: Patient is awake and abd is feeling a little better today.  Still with melanotic stools in flexiseal  Allergies: Patient has no known allergies.  Medications: Prior to Admission medications   Medication Sig Start Date End Date Taking? Authorizing Provider  allopurinol (ZYLOPRIM) 300 MG tablet Take 300 mg by mouth daily.   Yes [provider]  aspirin EC 81 MG tablet Take 81 mg by mouth daily.   Yes [provider]  cephALEXin (KEFLEX) 500 MG capsule Take 500 mg by mouth 3 (three) times daily.   Yes [provider]  Cholecalciferol (VITAMIN D3) 2000 units TABS Take 1 tablet by mouth 2 (two) times daily.   Yes [provider]  Ferrous Sulfate (IRON) 325 (65 Fe) MG TABS Take 1 tablet by mouth 2 (two) times daily.    Yes [provider]  furosemide (LASIX) 20 MG tablet Take 20 mg by mouth daily.    Yes [provider]  metoprolol succinate (TOPROL-XL) 25 MG 24 hr tablet Take 12.5 mg by mouth every other day.    Yes [provider]  omeprazole (PRILOSEC) 20 MG capsule Take 20 mg by mouth daily.   Yes [provider]  potassium chloride SA (K-DUR,KLOR-CON) 20 MEQ tablet Take 20 mEq by mouth daily.   Yes [provider]  PREDNISONE PO Take 1 tablet by mouth daily.   Yes [provider]    Vital Signs: BP (!) 146/76   Pulse (!) 59   Temp 97.7 F (36.5 C) (Oral)   Resp 14   Ht 5\' 10"  (1.778 m)   Wt 156 lb 4.9 oz (70.9 kg)   SpO2 100%   BMI 22.43 kg/m   Physical Exam: Abd: soft, tender around drain with copious purulent drainage, no output in drain currently.  Imaging: Dg Chest 2 View  Result Date: 07/21/2017 CLINICAL DATA:  Pleural effusion.  History of coronary artery disease, hypertension. EXAM: CHEST  2 VIEW COMPARISON:  Chest x-rays dated 07/18/2017, 07/15/2017 and 12/16/2012. FINDINGS: Small right pleural effusion. Persistent elevation of the right hemidiaphragm, likely with overlying atelectasis. Left lung appears clear. Heart size and mediastinal contours are stable. Large hiatal hernia better demonstrated on earlier chest x-rays. Percutaneous drainage catheter underlies the right hemidiaphragm. IMPRESSION: Small right pleural effusion, with probable adjacent atelectasis. Electronically Signed   By: Bary Richard M.D.   On: 07/21/2017 18:29   Ir Sinus/fist Tube Chk-non Gi  Result Date: 07/23/2017 INDICATION: 71 year old male with a history of right upper quadrant abscess. EXAM: IMAGE GUIDED EXISTING DRAIN INJECTION MEDICATIONS: NONE ANESTHESIA/SEDATION: NONE COMPLICATIONS: None PROCEDURE: Informed written consent was obtained from the patient after a thorough discussion of the procedural risks, benefits and alternatives. All questions were addressed. Maximal Sterile Barrier Technique was utilized including caps, mask, sterile gowns, sterile gloves, sterile drape, hand hygiene and skin antiseptic. A timeout was performed prior to the initiation of the procedure. Patient positioned supine position on the fluoroscopy table. The patient was prepped and draped in the usual sterile fashion. Scout images of the upper abdomen performed. Under fluoroscopy, the existing right upper quadrant drain was injected confirming no damage of the drain catheter, an the drain catheter positioned well within the right upper quadrant on the surface of  the liver. Contrast accumulated on the surface of the liver and in the right pericolic gutter. No evidence of fistula to the GI system or to the biliary system. Copious sterile saline flush was then flushed through the catheter, which was attached to gravity drainage. FINDINGS: Contrast injection demonstrates  functional drainage catheter of the right upper quadrant with no evidence of fistula to the biliary system or the GI system. IMPRESSION: Status post injection of existing right upper quadrant abscess drain, demonstrating functional abscess drain and no evidence of fistula to the GI or biliary system. Signed, Yvone NeuJaime S. Loreta AveWagner, DO [facility] Electronically Signed   By: Gilmer MorJaime  Wagner D.O.   On: 07/23/2017 15:09   Dg Chest Port 1 View  Result Date: 07/22/2017 CLINICAL DATA:  Shortness of breath. History of coronary artery disease and hypertension. EXAM: PORTABLE CHEST 1 VIEW COMPARISON:  Chest x-rays dated 07/21/2017 and 07/15/2017. FINDINGS: Mild cardiomegaly is stable. Overall cardiomediastinal silhouette is stable in size and configuration. Atherosclerotic calcifications noted at the aortic arch. Small right pleural effusion and/or atelectasis is stable. Left lung remains clear. Percutaneous drainage catheter underlying the right hemidiaphragm. IMPRESSION: 1. Stable chest x-ray. Small right pleural effusion and/or atelectasis. No evidence of pneumonia or pulmonary edema. 2. Aortic atherosclerosis. Electronically Signed   By: Bary RichardStan  Maynard M.D.   On: 07/22/2017 07:41    Labs:  CBC:  Recent Labs  07/21/17 0453 07/22/17 0327 07/22/17 2335 07/23/17 0903 07/24/17 0435  WBC 6.1 5.5  --  5.6 5.5  HGB 8.0* 9.2* 8.7* 8.5* 7.4*  HCT 23.9* 28.9* 27.3* 26.7* 23.1*  PLT 183 188  --  186 186    COAGS:  Recent Labs  07/17/17 1041 07/18/17 1640  INR 1.21 1.21  APTT  --  33    BMP:  Recent Labs  07/21/17 0453 07/22/17 0327 07/23/17 0559 07/24/17 0435  NA 147*  146* 146*  146* 146* 142  K 2.9*  2.9* 3.5  3.4* 3.9 2.6*  CL 105  103 105  104 109 105  CO2 25  24 23  22 22 28   GLUCOSE 92  91 77  78 96 127*  BUN 79*  80* 75*  75* 68* 54*  CALCIUM 8.4*  8.3* 8.6*  8.5* 8.3* 8.1*  CREATININE 4.08*  4.09* 4.05*  4.03*  3.81* 3.40*  GFRNONAA 13*  13* 14*  14* 15* 17*  GFRAA 16*  16* 16*  16* 17* 19*    LIVER FUNCTION TESTS:  Recent Labs  07/21/17 0453 07/22/17 0327 07/23/17 0559 07/24/17 0435  BILITOT 1.2 1.3* 1.5* 0.6  AST 15 15 19  14*  ALT 12* 12* 10* 10*  ALKPHOS 52 53 48 41  PROT 4.9* 5.8* 5.3* 5.0*  ALBUMIN 1.4*  1.3* 1.5*  1.5* 1.4* 1.2*    Assessment and Plan: 1. Intra-abdominal abscess, s/p perc drain Drain injection shows no fistula Patient has the largest drain in place, not able to upsize. Repeat CT scan to evaluate this collection. Will follow.  Electronically Signed: Letha CapeSBORNE,Ingrid Shifrin E 07/24/2017, 2:02 PM   I spent a total of 15 Minutes at the the patient's bedside AND on the patient's hospital floor or unit, greater than 50% of which was counseling/coordinating care for intra-abdominal abscess

## 2017-07-24 NOTE — Progress Notes (Signed)
PROGRESS NOTE    Jonathan Walker  ZOX:[id number] DOB: [birth year] DOA: 07/15/2017 PCP: Samuel Jester, DO   Brief Narrative:  Patient is a 71 year old CAD s/p CABG, hypertension, hyperlipidemia, gout who resents with weakness and was found to have AKI.  His lasix was increased from every other day to daily dosing about 1 month ago.  For the last few days, however, he has not made very much urine output and he has become very fatigued.  Additionally, he was seen by dermatology, Dr. Margo Aye this month for some new right lower extremity ulcers.  They were biopsied and biopsy demonstrated no evidence of vasculitis, but sample was superficial and nonspecific.  He was diagnosed with polyarteritis nodosum and given keflex and prednisone taper which have helped his ulcers.  He had a weakly positive ANA and CANCA, both titers were 1:80 earlier this month. Creatinine has worsened and patient was transferred to [facility] and had a drain placed for an intra-abdominal abscess. Drain is leaking quite significantly and color changed to brown drainage and plan is for Drain Injection Monday 07/23/17. Patient's color of drainage was brown today resembling stool. Overnight 10/28-10/29 patient had 4 bloody bowel movements and Stat H/H was ordered and Hb dropped slightly so Gastroenterology was consulted for further evaluation.   Patient's Hb/Hct continued to drop and K+ was low. Patient was transfused another unit of pRBC's and was given 50 mEQ of IV KCl. Patient was to go down for an EGD today however developed significant bleeding from around his Abdominal Drain and continued to bleed in the rectum. Gastroenterology suspected that the patient had a freely perforated duodenal ulcer with bleeding both into the peritoneum and lumen of GI tract and after discussion with General Surgery it was felt that since that the patient was not an operative candidate that he should not undergo upper endoscopy. Dr. Myrtie Neither recommended to the family  that the patient should be transitioned to Comfort Measures.   I spoke in depth with the family and the two sons regarding the patient's status and because patient is extremely ill and likely to decompensate, they wished him to be Comfort Care and be transitioned to [facility]idential [facility]. Palliative Care and Social Worker were consulted for further assistance. All aggressive measures were discontinued that were not regarding the patient's comfort and patient was allowed to eat for Comfort Feedings. Family hoping for [facility]  Assessment & Plan:   Active Problems:   AKI (acute kidney injury) (HCC)   Polyarteritis nodosa (HCC)   Normocytic anemia   Intra-abdominal abscess (HCC)   Acute encephalopathy   CAD (coronary artery disease)   Gout   Essential hypertension   Ulcers of both lower legs (HCC)   Hyperammonemia (HCC)   History of alcoholism (HCC)   Hypernatremia   Hypokalemia   Hypomagnesemia   Hyperphosphatemia  Acute kidney injury with microscopic Hematuria/Evidence of possible nephritis.   -Baseline creatinine is 0.9 on May 8th, 2018.  On and at that time his lasix dose was increased.   -Creatinine 1.38 on Oct 1st.  Possible this may all be due to ATN from dehydration/overdiuresis, but autoimmune disorder also possible. Cr has worsened in the setting of Abdominal Infection/Abscess; Slowly improving and now Cr is 3.40 -Nephrology consultation appreciated -ANA has been repeated -Mpo/pr-3 ANCA Ab WNL  -Complement:  C3 70 (Low), CH 50 and C4 wnl -HCV negative and HBV negative -HIV Non-Reactive -UPEP being collected -Bicarb gtt 50 mL/hr by Nephro now stopped by Nephro  and they are starting IVF as below  -Lasix for Hyperkalemia held by Nephrology  -Patiromer stopped Nephrology -Avoid Kayexelate in setting of occult positive stool and unusual abdominal fluid collection -Renal US:  Minimally complex bilateral renal cysts, unchanged from prior -May need renal biopsy but will  hold off for now and if Cr still stays up will biopsy in a few weeks. 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  -C/w IV Zosyn for Palliation -C/w D5W 1/2 NS IVF at 50 mL/hr -All aggressive Measures have been discontinued now that patient is transitioned to Comfort Measures.   Intra-abdominal Abscess s/p Drain by IR with Suspected Duodenal Leak/ Small Bowel Perforation and likely from Duodenal Perforation  -Was originally suscpected infected ascites given suggestion of cirrhosis versus blood versus contained bowel perforation; Suspect likely from bowel or enteric source and now thinking it is a perforated duodenum -CT scan: Large fluid collection around the liver extending into the right lower quadrant with air fluid level -Transferred to [facility] for IR paracentesis and drain placement -IR drained 1.1 Liter of purulent discharge and drainage tube showed initally showed cloudy grey purulent discharge  -IR sending Fluid for Bilirubin and if elevated will proceed with HIDA Scan to evaluate for Bile Leak; However unable to obtain Bilrubin Test on Abscess fluid per Lab; Per IR continue to Monitor Drain Output and because patient has Copious amount of Drainage with leaking. The color of drainage changed from green to brown and  will need Drain Injection and is ordered for Monday 07/23/17 -Drain injection done today and No fistula was identified and drain was working and patent; IR recommending continue gravity drain, sterile flush once per day, and Routine drain care -WOC Nurse was consulted for Drainage Management and had Eakin Pouch and skin barrier placed -Ordered paracentesis labs with cytology and a hematocrit (in case it's bloody) -Patient was Pan-Cultured (Blood, Urine, CXR) and started on Empiric Zosyn; Will continue for now for Palliation  -Repeat Urine Cx 07/19/17 showed No Growth and then 30,000 CFU of Yeast and likley  is colonization but will need to remove foley and get Urine Cx.  -Abscess Gram Stain showed Abundant WBC's, Abundant GNR, Abundant Gram + Cocci and Gram + Rods and pending Cx showed Multiple species present non-predominant -Blood Cx show NGTD at 5 Days  -Abdominal CT Scan repeated ordered and showed significant interval resolution of perihepatic fluid collection s/p catheter drainage and very small amount of residual fluid and air remains -C/w IV Zosyn with Pharmacy to Dose -General Surgery following and patient is not a surgical candidate given Liver Cirrhosis -While Gastroenterology was in the Room with the Patient, the Patient's Abdominal Drain started diffusely draining blood. Gastroenterology talked with family and Endoscopy was not pursued and Comfort Measures were discussed -I discussed with the patient's family that without surgical intervention patient would likely decompensate and pass away and so they elected to pursue Comfort Measures. All aggressive measures were discontinued. Palliative Care Medicine was consulted for Further Recc's and Emotional Support. Patient's Family hoping for [facility]ice in [facility]   Acute Blood Loss Anemia/Anemia of inflammation and possible renal disease and slow GI losses and now BRBPR/Hematochezia, Blood Bowel Movements and from suspected Diverticular Perforation -Hemoglobin continued to decline which may be hemodilutional but also consider blood loss around liver? -Iron studies suggest inflammation, B12 HIGH, folate wnl, SPEP pending -TSH 1.76 -Occult stool POSITIVE initially and repeat Negative; Wife describes it as Bright Red  Blood last night and now has Dignisheild  -C/w Ferrous Sulfate 325 mg po BID -ASA 81 mg stopped  -Nephrology ordered Hemolysis Labs -Hb/Hct went from 7.2/21.2 -> 8.8/26.0 -> 7.4/21.4 -> 7.7/22.4 -> 7.2/21.4 -> 9.1/27.2 -> 8.0/23.9 -> 9.2/28.9 -> 8.7/27.3 -> 8.5/26.7 -> 7.4/23.1 -S/p 1 unit of pRBC's and transfused another one today   -Gastroenterology consulted for further Evaluation and Management for additional Recommendations  -Patient having blood come from Abdominal drain and rectally so EGD was not pursued as Dr. Myrtie Neither feels patient has a perforated ulcer and recommending Comfort Care as patient is not a surgical candidate.   Hypernatremia, improved -Now 142 -Bicarbonate gtt now stopped -Started on D5W 1/2 NS IVF at 50 mL/hr -Will not repeat any blood work now that patient is Comfort Measures  Hypokalemia -Patient's K+ was 2.6 this AM -Replete with IV KCl 50 mEQ -Will not repeat any blood work now that patient is Comfort Measures  Hypomagnesemia -Patient's Mag Level 1.5 -Will not repeat any blood work now that patient is Comfort Measures  Hyperphosphatemia, improved  -Patient's Phos Level was 5.8 and went to 4.1  -Will not repeat any blood work now that patient is Comfort Measures  Acute Encephalopathy likely Infectious Etiology, improved but still slightly confused  -Received 1 mg of Ativan 10/23 for CIWA but doubt his Encephalopathy is explained by that -Transferred to SDU for Closer Evaluation  -Ordered Head CT Scan w/o Contrast and showed no Acute Finding by CT but showed atrophy and chronic small-vessel ischemich cangeds -Bicarb gtt (150 mEQ) at 50 mL/hr (decreased by Nephro) now stopped; Patient on IVF with D5W 1/2 NS at 50 mL/hr -Checked Ammonia Level and decreased from 52 -> 29 -RPR was Non-Reactive  -All aggressive measures were discontinued. Palliative Care Medicine was consulted for Further Recc's and Emotional Support. Patient's Family hoping for Hospice in [facility].   Coronary Artery Disease -Stable -Held Aspirin given concern for GIB; D/C'd Beta Blocker -Not on Statin  Gout -Stable -Discontinued Allopurinol  Hypertension -Stopped Beta Blocer   Leg Ulcers on Right Leg  -possible polyarteritis nodosa, improved with antibiotics and steroids -Good pedal pulse -ABI  wnl -Appreciate wound care: Santyl with dry dressings daily -All aggressive measures were discontinued. Palliative Care Medicine was consulted for Further Recc's and Emotional Support. Patient's Family hoping for H[facility]ice in [facility].   Leukocytosis likely from Intra-abdominal Abscess and suspected Duodenal Leak -Less Likely from Steroids -Started Patient on Emprici Zosyn and improved  -Pan-Culture and repeat Abdominal CT Scan as above  -WBC went from 22.4 -> 18.7 -> 13.1 -> 8.7 -> 8.0 -> 6.1 -> 5.5 -> 5.6 -> 5.5 -Will not repeat any blood work now that patient is Comfort Measures  Hx of EtOH Abuse/Concern for Withdrawal -Patient's family states he drinks heavily -Placed on Comfort Measures   Hyperammonemia, improved -Patient's Ammonia Level 52 and improved to 29 -Repeat Ammonia Level in AM and await Repeat CT Scan findings prior to administration of Lactulose  -Patient now no Comfort Measures  Liver Cirrhosis -Continue to Monitor LFT's; AST is 19 and ALT is 10 -Will not repeat any blood work now that patient is Comfort Measures  Bilateral Upper Extremity Swelling -Likely dependent Edema -Patient now Comfort Measures    DVT prophylaxis: None Comfort Measures Code Status: DO NOT RESUSCITATE  Family Communication: Discussed with Wife and Two Sons, and Family Disposition Plan: Remain in SDU, Hopefully Residential Hospice  Consultants:   Nephrology  General Surgery  Interventional Radiology  Procedures:  S/p CT DRAIN RT ABDOMEN  DRAIN Injection and showed no fistula identified and drain is working and patent.    Antimicrobials: Anti-infectives    Start     Dose/Rate Route Frequency Ordered Stop   07/18/17 1300  piperacillin-tazobactam (ZOSYN) IVPB 2.25 g     2.25 g 100 mL/hr over 30 Minutes Intravenous Every 6 hours 07/18/17 1245       Subjective: Seen and examined and had no complaints this AM however throughout the day he started having large amount of  bloody drainage come from around the drain and still having bloody bowel movements. Not a surgical candidate and Gastroenterology discussed with the family that EGD may not be beneficial so family pursued Comfort Measures and [facility]   Objective: Vitals:   07/24/17 1115 07/24/17 1130 07/24/17 1402 07/24/17 1548  BP: (!) 141/67 (!) 146/76 (!) 163/72 (!) 166/73  Pulse:   69 71  Resp:   13 12  Temp: (!) 97.5 F (36.4 C) 97.7 F (36.5 C) 98.1 F (36.7 C) 97.8 F (36.6 C)  TempSrc: Oral Oral Oral Oral  SpO2:      Weight:      Height:        Intake/Output Summary (Last 24 hours) at 07/24/17 1822 Last data filed at 07/24/17 1549  Gross per 24 hour  Intake             1300 ml  Output             1065 ml  Net              235 ml   Filed Weights   07/15/17 1707 07/15/17 2300  Weight: 71.7 kg (158 lb) 70.9 kg (156 lb 4.9 oz)   Examination: Physical Exam:  Constitutional: Caucasian male who is calm and ill appearing  Eyes: Sclerae anicteric. Conjunctivae with some pallor ENMT: External Ears and nose appear normal. No teeth Neck: Supple with no JVD Respiratory: Diminished to Auscultation No appreciable wheezing/rales/rhonchi Cardiovascular: RRR; Slight 2/6 Systolic Murmur. 2+ Upper Extremity and 1+ LE Edema Abdomen: Soft, Tender to palpate. Bowel sounds presnt. Right Drain in Place with no drainage on my discharged GU: Foley in place with dark color urine. Dignisheild in with Burgandy Blood in it Musculoskeletal: No contractures. No cyanosis. Has upper extremity dependent edema Skin: Warm. Has some upper extremity ecchymosis. Has Right Leg ulcerations. 1+ Lower Extremity edema and 2+ Upper extremity edema. Has abdominal Right side drain. No blood on my exam but when GI saw had copious amount of bright red blood and when re-evaluated nursing had cleaned Neurologic: CN 2-12 grossly intact. No appreciable focal deficits Psychiatric: Normal mood and  affect. Awake and Alert but still not fully oriented  Data Reviewed: I have personally reviewed following labs and imaging studies  CBC:  Recent Labs Lab 07/20/17 0400  07/21/17 0453 07/22/17 0327 07/22/17 2335 07/23/17 0903 07/24/17 0435  WBC 8.0  --  6.1 5.5  --  5.6 5.5  NEUTROABS 6.0  --  4.4 3.7  --  3.7 3.5  HGB 7.2*  < > 8.0* 9.2* 8.7* 8.5* 7.4*  HCT 21.4*  < > 23.9* 28.9* 27.3* 26.7* 23.1*  MCV 89.5  --  90.2 92.9  --  93.7 93.1  PLT 250  --  183 188  --  186 186  < > = values in this interval not displayed. Basic Metabolic Panel:  Recent Labs  Lab 07/20/17 0400 07/20/17 1911 07/21/17 0453 07/22/17 0327 07/23/17 0559 07/24/17 0435  NA 147* 145 147*  146* 146*  146* 146* 142  K 2.9* 3.2* 2.9*  2.9* 3.5  3.4* 3.9 2.6*  CL 104 104 105  103 105  104 109 105  CO2 24 25 25  24 23  22 22 28   GLUCOSE 97 94 92  91 77  78 96 127*  BUN 90* 83* 79*  80* 75*  75* 68* 54*  CREATININE 4.25* 4.04* 4.08*  4.09* 4.05*  4.03* 3.81* 3.40*  CALCIUM 8.3* 8.3* 8.4*  8.3* 8.6*  8.5* 8.3* 8.1*  MG 1.6*  --  1.8 1.7 1.7 1.5*  PHOS 5.3*  --  5.2*  5.3* 4.9*  5.0* 4.6 4.1   GFR: Estimated Creatinine Clearance: 20 mL/min (A) (by C-G formula based on SCr of 3.4 mg/dL (H)). Liver Function Tests:  Recent Labs Lab 07/20/17 0400 07/21/17 0453 07/22/17 0327 07/23/17 0559 07/24/17 0435  AST 17 15 15 19  14*  ALT 13* 12* 12* 10* 10*  ALKPHOS 59 52 53 48 41  BILITOT 1.4* 1.2 1.3* 1.5* 0.6  PROT 4.9* 4.9* 5.8* 5.3* 5.0*  ALBUMIN 1.4* 1.4*  1.3* 1.5*  1.5* 1.4* 1.2*   No results for input(s): LIPASE, AMYLASE in the last 168 hours.  Recent Labs Lab 07/18/17 1640 07/19/17 0548  AMMONIA 52* 29   Coagulation Profile:  Recent Labs Lab 07/18/17 1640  INR 1.21   Cardiac Enzymes: No results for input(s): CKTOTAL, CKMB, CKMBINDEX, TROPONINI in the last 168 hours. BNP (last 3 results) No results for input(s): PROBNP in the last 8760 hours. HbA1C: No results for  input(s): HGBA1C in the last 72 hours. CBG:  Recent Labs Lab 07/20/17 1135  GLUCAP 102*   Lipid Profile: No results for input(s): CHOL, HDL, LDLCALC, TRIG, CHOLHDL, LDLDIRECT in the last 72 hours. Thyroid Function Tests: No results for input(s): TSH, T4TOTAL, FREET4, T3FREE, THYROIDAB in the last 72 hours. Anemia Panel: No results for input(s): VITAMINB12, FOLATE, FERRITIN, TIBC, IRON, RETICCTPCT in the last 72 hours. Sepsis Labs: No results for input(s): PROCALCITON, LATICACIDVEN in the last 168 hours.  Recent Results (from the past 240 hour(s))  Aerobic/Anaerobic Culture (surgical/deep wound)     Status: Abnormal   Collection Time: 07/18/17 11:42 AM  Result Value Ref Range Status   Specimen Description ABDOMEN  Final   Special Requests Normal  Final   Gram Stain   Final    ABUNDANT WBC PRESENT, PREDOMINANTLY PMN ABUNDANT GRAM NEGATIVE RODS ABUNDANT GRAM POSITIVE COCCI MODERATE GRAM POSITIVE RODS    Culture (A)  Final    MULTIPLE ORGANISMS PRESENT, NONE PREDOMINANT MIXED ANAEROBIC FLORA PRESENT.  CALL LAB IF FURTHER IID REQUIRED.    Report Status 07/21/2017 FINAL  Final  Culture, Urine     Status: Abnormal   Collection Time: 07/18/17 12:11 PM  Result Value Ref Range Status   Specimen Description URINE, RANDOM  Final   Special Requests NONE  Final   Culture MULTIPLE SPECIES PRESENT, SUGGEST RECOLLECTION (A)  Final   Report Status 07/19/2017 FINAL  Final  Culture, blood (routine x 2)     Status: None   Collection Time: 07/18/17  1:30 PM  Result Value Ref Range Status   Specimen Description BLOOD LEFT HAND  Final   Special Requests IN PEDIATRIC BOTTLE Blood Culture adequate volume  Final   Culture NO GROWTH 5 DAYS  Final   Report Status 07/23/2017 FINAL  Final  Culture, blood (routine x 2)     Status: None   Collection Time: 07/18/17  1:30 PM  Result Value Ref Range Status   Specimen Description BLOOD RIGHT HAND  Final   Special Requests IN PEDIATRIC BOTTLE Blood  Culture adequate volume  Final   Culture NO GROWTH 5 DAYS  Final   Report Status 07/23/2017 FINAL  Final  MRSA PCR Screening     Status: None   Collection Time: 07/18/17  7:10 PM  Result Value Ref Range Status   MRSA by PCR NEGATIVE NEGATIVE Final    Comment:        The GeneXpert MRSA Assay (FDA approved for NASAL specimens only), is one component of a comprehensive MRSA colonization surveillance program. It is not intended to diagnose MRSA infection nor to guide or monitor treatment for MRSA infections.   Culture, Urine     Status: None   Collection Time: 07/19/17  1:20 PM  Result Value Ref Range Status   Specimen Description URINE, CATHETERIZED  Final   Special Requests NONE  Final   Culture NO GROWTH  Final   Report Status 07/20/2017 FINAL  Final  Culture, Urine     Status: Abnormal   Collection Time: 07/20/17  1:05 PM  Result Value Ref Range Status   Specimen Description URINE, CATHETERIZED  Final   Special Requests NONE  Final   Culture 30,000 COLONIES/mL YEAST (A)  Final   Report Status 07/21/2017 FINAL  Final    Radiology Studies: Ir Sinus/fist Tube Chk-non Gi  Result Date: 07/23/2017 INDICATION: 71 year old male with a history of right upper quadrant abscess. EXAM: IMAGE GUIDED EXISTING DRAIN INJECTION MEDICATIONS: NONE ANESTHESIA/SEDATION: NONE COMPLICATIONS: None PROCEDURE: Informed written consent was obtained from the patient after a thorough discussion of the procedural risks, benefits and alternatives. All questions were addressed. Maximal Sterile Barrier Technique was utilized including caps, mask, sterile gowns, sterile gloves, sterile drape, hand hygiene and skin antiseptic. A timeout was performed prior to the initiation of the procedure. Patient positioned supine position on the fluoroscopy table. The patient was prepped and draped in the usual sterile fashion. Scout images of the upper abdomen performed. Under fluoroscopy, the existing right upper quadrant  drain was injected confirming no damage of the drain catheter, an the drain catheter positioned well within the right upper quadrant on the surface of the liver. Contrast accumulated on the surface of the liver and in the right pericolic gutter. No evidence of fistula to the GI system or to the biliary system. Copious sterile saline flush was then flushed through the catheter, which was attached to gravity drainage. FINDINGS: Contrast injection demonstrates functional drainage catheter of the right upper quadrant with no evidence of fistula to the biliary system or the GI system. IMPRESSION: Status post injection of existing right upper quadrant abscess drain, demonstrating functional abscess drain and no evidence of fistula to the GI or biliary system. Signed, Yvone Neu. Loreta Ave, DO [facility] Electronically Signed   By: Gilmer Mor D.O.   On: 07/23/2017 15:09   Scheduled Meds: . chlorhexidine  15 mL Mouth Rinse BID  . Chlorhexidine Gluconate Cloth  6 each Topical Daily  . collagenase   Topical Daily  . mouth rinse  15 mL Mouth Rinse q12n4p  . multivitamin with minerals  1 tablet Oral Daily  . pantoprazole (PROTONIX) IV  40 mg Intravenous Q12H  . sodium chloride flush  10-40 mL Intracatheter Q12H  .  sodium chloride flush  5 mL Intravenous Q8H  . thiamine  100 mg Oral Daily   Or  . thiamine  100 mg Intravenous Daily   Continuous Infusions: . sodium chloride    . sodium chloride    . dextrose 5 % and 0.45% NaCl 50 mL/hr at 07/24/17 1347  . piperacillin-tazobactam (ZOSYN)  IV 2.25 g (07/24/17 1649)    LOS: 9 days    Critical Care Time: 39 Minutes  Time Spent Non-Consecutive; 0920-0932 and [id number]  Merlene Laughter, DO [facility] Pager [phone]  If 7PM-7AM, please contact night-coverage [email or web address] Password [email or web address] 07/24/2017, 6:22 PM

## 2017-07-24 NOTE — Plan of Care (Signed)
Problem: Physical Regulation: Goal: Ability to maintain clinical measurements within normal limits will improve Outcome: Progressing HGB 7.9 one unit of PRBC given.

## 2017-07-24 NOTE — [place] FL2 (Signed)
[place] MEDICAID FL2 LEVEL OF CARE SCREENING TOOL     IDENTIFICATION  Patient Name: Jonathan Walker Birthdate: 05/10/1946 Sex: male Admission Date (Current Location): 07/15/2017  [facility] [place]  [place]   Facility and Address:  [facility] [place], [address], [place], [place] [address]      Provider Number: [id number]  Attending Physician Name and Address:  Merlene LaughterSheikh, Omair Latif, DO  Relative Name and Phone Number:       Current Level of Care: Hospital Recommended Level of Care: Skilled Nursing Facility Prior Approval Number:    Date Approved/Denied:   PASRR Number: [id number] [id number]  Discharge Plan: SNF    Current Diagnoses: Patient Active Problem List   Diagnosis Date Noted  . Hypernatremia 07/20/2017  . Hypokalemia 07/20/2017  . Hypomagnesemia 07/20/2017  . Hyperphosphatemia 07/20/2017  . Intra-abdominal abscess (HCC) 07/18/2017  . Acute encephalopathy 07/18/2017  . CAD (coronary artery disease) 07/18/2017  . Gout 07/18/2017  . Essential hypertension 07/18/2017  . Ulcers of both lower legs (HCC) 07/18/2017  . Hyperammonemia (HCC) 07/18/2017  . History of alcoholism (HCC) 07/18/2017  . 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 Normocytic anemia 07/16/2017  . AKI (acute kidney injury) (HCC) 07/15/2017  . Gouty arthritis 01/23/2014  . Acute gouty arthritis 01/16/2014  . Pain in lower limb 11/14/2013  . Other hammer toe (acquired) 08/01/2013  . Onychomycosis due to dermatophyte 01/28/2013  . Pain in joint, ankle and foot 01/28/2013    Orientation RESPIRATION BLADDER Height & Weight     Self  O2 ([place] 2L) Indwelling catheter Weight: 156 lb 4.9 oz (70.9 kg) Height:  5\' 10"  (177.8 cm)  BEHAVIORAL SYMPTOMS/MOOD NEUROLOGICAL BOWEL NUTRITION STATUS      Incontinent Diet (low sodium)  AMBULATORY STATUS COMMUNICATION OF NEEDS Skin   Extensive Assist Verbally Skin abrasions (lower right leg wound, foam dressing changed daily; left second toe  wound, foam dressing)                       Personal Care Assistance Level of Assistance  Bathing, Dressing Bathing Assistance: Maximum assistance   Dressing Assistance: Maximum assistance     Functional Limitations Info             SPECIAL CARE FACTORS FREQUENCY  PT (By licensed PT), OT (By licensed OT)     PT Frequency: 5x/wk OT Frequency: 5x/wk            Contractures      Additional Factors Info  Code Status, Allergies Code Status Info: DNR Allergies Info: NKA           Current Medications (07/24/2017):  This is the current hospital active medication list Current Facility-Administered Medications  Medication Dose Route Frequency Provider Last Rate Last Dose  . 0.9 %  sodium chloride infusion   Intravenous Once Schorr, Roma KayserKatherine P, NP      . 0.9 %  sodium chloride infusion   Intravenous Once Gribbin, Sarah J, PA-C      . acetaminophen (TYLENOL) tablet 650 mg  650 mg Oral Q6H PRN Meredeth IdeLama, Gagan S, MD       Or  . acetaminophen (TYLENOL) suppository 650 mg  650 mg Rectal Q6H PRN Meredeth IdeLama, Gagan S, MD      . allopurinol (ZYLOPRIM) tablet 300 mg  300 mg Oral Idelia SalmQODAY Short, Mackenzie, MD   300 mg at 07/22/17 0944  . chlorhexidine (PERIDEX) 0.12 % solution 15 mL  15 mL Mouth  Rinse BID Sheikh, Omair Latif, DO   15 mL at 07/24/17 1000  . collagenase (SANTYL) ointment   Topical Daily Short, Mackenzie, MD      . dextrose 5 %-0.45 % sodium chloride infusion   Intravenous Continuous Arnetha Courser, MD 50 mL/hr at 07/23/17 1537    . MEDLINE mouth rinse  15 mL Mouth Rinse q12n4p Sheikh, Omair Latif, DO   15 mL at 07/24/17 1136  . metoprolol succinate (TOPROL-XL) 24 hr tablet 12.5 mg  12.5 mg Oral Daily Short, Mackenzie, MD   12.5 mg at 07/23/17 1513  . multivitamin with minerals tablet 1 tablet  1 tablet Oral Daily Bodenheimer, Charles A, NP   1 tablet at 07/23/17 1514  . ondansetron (ZOFRAN) tablet 4 mg  4 mg Oral Q6H PRN Meredeth Ide, MD       Or  . ondansetron [facility])  injection 4 mg  4 mg Intravenous Q6H PRN Meredeth Ide, MD      . pantoprazole (PROTONIX) injection 40 mg  40 mg Intravenous [address] [place], DO   40 mg at 07/23/17 2232  . piperacillin-tazobactam (ZOSYN) IVPB 2.25 g  2.25 g Intravenous Q6H Gerilyn Nestle, [facility]   Stopped at 07/24/17 0530  . sodium chloride flush (NS) 0.9 % injection 5 mL  5 mL Intravenous Q8H Berdine Dance, MD   5 mL at 07/24/17 0901  . thiamine (VITAMIN B-1) tablet 100 mg  100 mg Oral Daily Bodenheimer, Charles A, NP   100 mg at 07/22/17 1610   Or  . thiamine (B-1) injection 100 mg  100 mg Intravenous Daily Bodenheimer, Charles A, NP   100 mg at 07/23/17 9604     Discharge Medications: Please see discharge summary for a list of discharge medications.  Relevant Imaging Results:  Relevant Lab Results:   Additional Information SS#: [id number]  Burna Sis, LCSW

## 2017-07-24 NOTE — Progress Notes (Signed)
Peripherally Inserted Central Catheter/Midline Placement  The IV Nurse has discussed with the patient and/or persons authorized to consent for the patient, the purpose of this procedure and the potential benefits and risks involved with this procedure.  The benefits include less needle sticks, lab draws from the catheter, and the patient may be discharged home with the catheter. Risks include, but not limited to, infection, bleeding, blood clot (thrombus formation), and puncture of an artery; nerve damage and irregular heartbeat and possibility to perform a PICC exchange if needed/ordered by physician.  Alternatives to this procedure were also discussed.  Bard Power PICC patient education guide, fact sheet on infection prevention and patient information card has been provided to patient /or left at bedside.    PICC/Midline Placement Documentation  PICC Double Lumen 07/24/17 PICC Right Basilic 47 cm 4 cm (Active)  Indication for Insertion or Continuance of Line Prolonged intravenous therapies 07/24/2017  1:18 PM  Exposed Catheter (cm) 4 cm 07/24/2017  1:18 PM  Site Assessment Clean;Dry;Intact 07/24/2017  1:18 PM  Lumen #1 Status Flushed;Saline locked;Blood return noted 07/24/2017  1:18 PM  Lumen #2 Status Flushed;Saline locked;Blood return noted 07/24/2017  1:18 PM  Dressing Type Transparent;Securing device 07/24/2017  1:18 PM  Dressing Status Clean;Dry;Intact;Antimicrobial disc in place 07/24/2017  1:18 PM  Dressing Change Due 07/31/17 07/24/2017  1:18 PM       Romie JumperAlford, Brindle Leyba Terry 07/24/2017, 1:22 PM

## 2017-07-25 DIAGNOSIS — E876 Hypokalemia: Secondary | ICD-10-CM

## 2017-07-25 DIAGNOSIS — K922 Gastrointestinal hemorrhage, unspecified: Secondary | ICD-10-CM

## 2017-07-25 DIAGNOSIS — Z7189 Other specified counseling: Secondary | ICD-10-CM

## 2017-07-25 DIAGNOSIS — E870 Hyperosmolality and hypernatremia: Secondary | ICD-10-CM

## 2017-07-25 DIAGNOSIS — Z515 Encounter for palliative care: Secondary | ICD-10-CM

## 2017-07-25 DIAGNOSIS — G9340 Encephalopathy, unspecified: Secondary | ICD-10-CM

## 2017-07-25 LAB — TYPE AND SCREEN
ABO/RH(D): O POS
Antibody Screen: NEGATIVE
Unit division: 0

## 2017-07-25 LAB — BPAM RBC
Blood Product Expiration Date: 201811052359
ISSUE DATE / TIME: 201810301104
Unit Type and Rh: 5100

## 2017-07-25 MED ORDER — SODIUM CHLORIDE 0.9% FLUSH: 10.0000 mL | Freq: Two times a day (BID) | INTRAVENOUS | Status: AC

## 2017-07-25 MED ORDER — COLLAGENASE 250 UNIT/GM EX OINT: TOPICAL_OINTMENT | Freq: Every day | CUTANEOUS | Status: AC

## 2017-07-25 MED ORDER — LORAZEPAM 2 MG/ML PO CONC: 1.0000 mg | Freq: Four times a day (QID) | ORAL | 0 refills | Status: AC | PRN

## 2017-07-25 MED ORDER — MORPHINE SULFATE (CONCENTRATE) 10 MG /0.5 ML PO SOLN: 5.0000 mg | ORAL | 0 refills | Status: AC | PRN

## 2017-07-25 NOTE — Care Management Note (Signed)
Case Management Note  Patient Details  Name: Jonathan Walker MRN: [id number] Date of Birth: 09/21/1946  Subjective/Objective:    Patient will dc to SNF with palliative today, CSW following.                Action/Plan:   Expected Discharge Date:  07/25/17               Expected Discharge Plan:  Skilled Nursing Facility  In-House Referral:  Clinical Social Work  Discharge planning Services  CM Consult  Post Acute Care Choice:    Choice offered to:     DME Arranged:    DME Agency:     HH Arranged:    HH Agency:     Status of Service:  Completed, signed off  If discussed at [employer] Meetings, dates discussed:    Additional Comments:  Leone Havenaylor, Emmerich Cryer Clinton, RN 07/25/2017, 12:49 PM

## 2017-07-25 NOTE — Progress Notes (Signed)
Patient ID: Jonathan Walker, male   DOB: 1946/07/21, 71 y.o.   MRN: [id number]    Referring Physician(s): Dr. Renae Fickle  Supervising Physician: Irish Lack  Patient Status: [name]  Chief Complaint: IAA  Subjective: Pt is awake and complains of some pain around his drain.  Wife at bedside states the dressing has been changed multiple times overnight.  Allergies: Patient has no known allergies.  Medications: Prior to Admission medications   Medication Sig Start Date End Date Taking? Authorizing Provider  allopurinol (ZYLOPRIM) 300 MG tablet Take 300 mg by mouth daily.   Yes [provider]  aspirin EC 81 MG tablet Take 81 mg by mouth daily.   Yes [provider]  cephALEXin (KEFLEX) 500 MG capsule Take 500 mg by mouth 3 (three) times daily.   Yes [provider]  Cholecalciferol (VITAMIN D3) 2000 units TABS Take 1 tablet by mouth 2 (two) times daily.   Yes [provider]  Ferrous Sulfate (IRON) 325 (65 Fe) MG TABS Take 1 tablet by mouth 2 (two) times daily.    Yes [provider]  furosemide (LASIX) 20 MG tablet Take 20 mg by mouth daily.    Yes [provider]  metoprolol succinate (TOPROL-XL) 25 MG 24 hr tablet Take 12.5 mg by mouth every other day.    Yes [provider]  omeprazole (PRILOSEC) 20 MG capsule Take 20 mg by mouth daily.   Yes [provider]  potassium chloride SA (K-DUR,KLOR-CON) 20 MEQ tablet Take 20 mEq by mouth daily.   Yes [provider]  PREDNISONE PO Take 1 tablet by mouth daily.   Yes [provider]    Vital Signs: BP 123/60   Pulse 67   Temp 98.1 F (36.7 C) (Oral)   Resp 11   Ht 5\' 10"  (1.778 m)   Wt 156 lb 4.9 oz (70.9 kg)   SpO2 99%   BMI 22.43 kg/m   Physical Exam: Abd: soft, tender on right side.  Drain with brown purulent drainage around drain site.  Drain removed and new pouch is going to be placed over the site.  Right side is  tender.  Imaging: Dg Chest 2 View  Result Date: 07/21/2017 CLINICAL DATA:  Pleural effusion. History of coronary artery disease, hypertension. EXAM: CHEST  2 VIEW COMPARISON:  Chest x-rays dated 07/18/2017, 07/15/2017 and 12/16/2012. FINDINGS: Small right pleural effusion. Persistent elevation of the right hemidiaphragm, likely with overlying atelectasis. Left lung appears clear. Heart size and mediastinal contours are stable. Large hiatal hernia better demonstrated on earlier chest x-rays. Percutaneous drainage catheter underlies the right hemidiaphragm. IMPRESSION: Small right pleural effusion, with probable adjacent atelectasis. Electronically Signed   By: Bary Richard M.D.   On: 07/21/2017 18:29   Ir Sinus/fist Tube Chk-non Gi  Result Date: 07/23/2017 INDICATION: 71 year old male with a history of right upper quadrant abscess. EXAM: IMAGE GUIDED EXISTING DRAIN INJECTION MEDICATIONS: NONE ANESTHESIA/SEDATION: NONE COMPLICATIONS: None PROCEDURE: Informed written consent was obtained from the patient after a thorough discussion of the procedural risks, benefits and alternatives. All questions were addressed. Maximal Sterile Barrier Technique was utilized including caps, mask, sterile gowns, sterile gloves, sterile drape, hand hygiene and skin antiseptic. A timeout was performed prior to the initiation of the procedure. Patient positioned supine position on the fluoroscopy table. The patient was prepped and draped in the usual sterile fashion. Scout images of the upper abdomen performed. Under fluoroscopy, the existing right upper quadrant drain was  injected confirming no damage of the drain catheter, an the drain catheter positioned well within the right upper quadrant on the surface of the liver. Contrast accumulated on the surface of the liver and in the right pericolic gutter. No evidence of fistula to the GI system or to the biliary system. Copious sterile saline flush was then flushed through the  catheter, which was attached to gravity drainage. FINDINGS: Contrast injection demonstrates functional drainage catheter of the right upper quadrant with no evidence of fistula to the biliary system or the GI system. IMPRESSION: Status post injection of existing right upper quadrant abscess drain, demonstrating functional abscess drain and no evidence of fistula to the GI or biliary system. Signed, Yvone Neu. Loreta Ave, DO [facility] Electronically Signed   By: Gilmer Mor D.O.   On: 07/23/2017 15:09   Dg Chest Port 1 View  Result Date: 07/22/2017 CLINICAL DATA:  Shortness of breath. History of coronary artery disease and hypertension. EXAM: PORTABLE CHEST 1 VIEW COMPARISON:  Chest x-rays dated 07/21/2017 and 07/15/2017. FINDINGS: Mild cardiomegaly is stable. Overall cardiomediastinal silhouette is stable in size and configuration. Atherosclerotic calcifications noted at the aortic arch. Small right pleural effusion and/or atelectasis is stable. Left lung remains clear. Percutaneous drainage catheter underlying the right hemidiaphragm. IMPRESSION: 1. Stable chest x-ray. Small right pleural effusion and/or atelectasis. No evidence of pneumonia or pulmonary edema. 2. Aortic atherosclerosis. Electronically Signed   By: Bary Richard M.D.   On: 07/22/2017 07:41    Labs:  CBC:  Recent Labs  07/21/17 0453 07/22/17 0327 07/22/17 2335 07/23/17 0903 07/24/17 0435  WBC 6.1 5.5  --  5.6 5.5  HGB 8.0* 9.2* 8.7* 8.5* 7.4*  HCT 23.9* 28.9* 27.3* 26.7* 23.1*  PLT 183 188  --  186 186    COAGS:  Recent Labs  07/17/17 1041 07/18/17 1640  INR 1.21 1.21  APTT  --  33    BMP:  Recent Labs  07/21/17 0453 07/22/17 0327 07/23/17 0559 07/24/17 0435  NA 147*  146* 146*  146* 146* 142  K 2.9*  2.9* 3.5  3.4* 3.9 2.6*  CL 105  103 105  104 109 105  CO2 25  24 23  22 22 28   GLUCOSE 92  91 77  78 96 127*  BUN 79*  80* 75*  75*  68* 54*  CALCIUM 8.4*  8.3* 8.6*  8.5* 8.3* 8.1*  CREATININE 4.08*  4.09* 4.05*  4.03* 3.81* 3.40*  GFRNONAA 13*  13* 14*  14* 15* 17*  GFRAA 16*  16* 16*  16* 17* 19*    LIVER FUNCTION TESTS:  Recent Labs  07/21/17 0453 07/22/17 0327 07/23/17 0559 07/24/17 0435  BILITOT 1.2 1.3* 1.5* 0.6  AST 15 15 19  14*  ALT 12* 12* 10* 10*  ALKPHOS 52 53 48 41  PROT 4.9* 5.8* 5.3* 5.0*  ALBUMIN 1.4*  1.3* 1.5*  1.5* 1.4* 1.2*    Assessment and Plan: 1. Intra-abdominal abscess, s/p perc drain  Patient transitioned to full comfort care.  Given drain isn't draining and all drainage is coming from around the drain, we will remove the drain and place an ostomy type pouch over the site to collect the drainage.  I have discussed this with his family as well as Dr. Waymon Amato.  All are in agreement.  Melody, WOC, RN is here now to assess for correct pouch.  Electronically Signed: Letha Cape 07/25/2017, 9:57 AM   I spent  a total of 15 Minutes at the the patient's bedside AND on the patient's hospital floor or unit, greater than 50% of which was counseling/coordinating care for intra-abdominal abscess

## 2017-07-25 NOTE — Consult Note (Signed)
WOC Nurse wound consult note Reason for Consult: pouch drain site WOC nurse partner saw this patient on Sunday to pouch around the drain. intraabdominal abscess with drain placed by IR earlier this week  Wound type: NA Pressure Injury POA: NA Measurement:small stab wound, 0.1cm x 0.3cm x unable to obtain depth Wound bed: NA Drainage (amount, consistency, odor) dark brown, appears to have dark old blood.  I do not appreciate odor today Periwound:intact, but tender. Some minor denudation about 0.5cm distally around drain site.  Dressing procedure/placement/frequency:  Drainable fecal ostomy pouch placed, however the drainage is very thin, may undermine pouch quickly. We had used [place] in the past but apparently didn't last very long either.   WOC nurse will follow along with you for support with drain site management. Shealyn Sean [facility] MSN,RN,CWOCN, CNS, [employer] [phone]

## 2017-07-25 NOTE — Progress Notes (Signed)
[facility] Anticipated discharge date: 10/31 Family notified: at bedside Transportation by PTAR- called at 1:40pm  CSW signing off.  Burna SisJenna H. Sharmila Wrobleski, LCSW Clinical Social Worker [phone]

## 2017-07-25 NOTE — Progress Notes (Signed)
Patient ID: Jonathan Walker, male   DOB: 11/06/1945, 71 y.o.   MRN: [id number]   Patient is being made comfort care. Gen Surg will sign off but are available if needed.

## 2017-07-25 NOTE — Clinical Social Work Placement (Signed)
   CLINICAL SOCIAL WORK PLACEMENT  NOTE  Date:  07/25/2017  Patient Details  Name: Jonathan Walker MRN: [id number] Date of Birth: 09/12/1946  Clinical Social Work is seeking post-discharge placement for this patient at the Skilled  Nursing Facility level of care (*CSW will initial, date and re-position this form in  chart as items are completed):  Yes   Patient/family provided with [place] Clinical Social Work Department's list of facilities offering this level of care within the geographic area requested by the patient (or if unable, by the patient's family).  Yes   Patient/family informed of their freedom to choose among providers that offer the needed level of care, that participate in Medicare, Medicaid or managed care program needed by the patient, have an available bed and are willing to accept the patient.  Yes   Patient/family informed of [place]'s ownership interest in [facility] and [facility], as well as of the fact that they are under no obligation to receive care at these facilities.  PASRR submitted to EDS on       PASRR number received on       Existing PASRR number confirmed on 07/24/17     FL2 transmitted to all facilities in geographic area requested by pt/family on 07/24/17     FL2 transmitted to all facilities within larger geographic area on       Patient informed that his/her managed care company has contracts with or will negotiate with certain facilities, including the following:        Yes   Patient/family informed of bed offers received.  [facility]     Physician recommends and patient chooses bed at      Patient to be [facility] 07/25/17.  Patient to be transferred to facility by ptar     Patient family notified on 07/25/17 of transfer.  Name of family member notified:  Myriam JacobsonHelen at bedside     PHYSICIAN Please sign FL2, Please sign  DNR     Additional Comment:    _______________________________________________ Burna SisUris, Maisyn Nouri H, LCSW 07/25/2017, 2:20 PM

## 2017-07-25 NOTE — Progress Notes (Signed)
Jonathan PointsWayne Walker to be D/C'd [facility] per MD. Discussed with the Shari HeritageSon, Jonathan Walker and all questions fully answered.   VSS, skin clean, dry and intact without evidence of skin break down, no evidence of skin tears noted.  IV catheters discontinued intact. Site without signs and symptoms of complications. Dressing and pressure applied.   An after visit summary was printed and given to the PTAR.   Patient escorted via PTAR.  Attempted report to [facility] Nurse.

## 2017-07-25 NOTE — Consult Note (Signed)
Consultation Note Date: 07/25/17  Patient Name: Jonathan Walker  DOB: 07-Sep-1946  MRN: [id number]  Age / Sex: 71 y.o., male  PCP: Octavio Graves, DO Referring Physician: No att. providers found  Reason for Consultation: Establishing goals of care  HPI/Patient Profile: 71 y.o. male  with past medical history of hypertension, hyperlipidemia, CAD s/p CABG, ETOH abuse, liver cirrhosis, gout, and neuropathy admitted on 07/15/2017 with weakness and decrease in urine output. Recently seen by dermatology for RLE ulcers. Diagnosed with polyarteritis nodosum and given keflex and prednisone taper. In ED, labs revealed acute kidney injury. Imaging studies including renal ultrasound 07/16/17 showed partially visualized complex fluid collection adjacent to the liver, unsure whether this represented fluid collection or complex ascites. CT abdomen and pelvis on 07/16/17 revealed large perihepatic gas/fluid collection probably communicating with another fluid collection in the right lower quadrant. Patient was transferred to [facility] for IR intervention and drainage of the abscess on 10/24. Course of hospitalization complicated by acute GI bleed and acute blood loss. GI and general surgery consulted and it was determined that patient was not an operative candidate and should not undergo upper endoscopy. Transitioned to comfort measures. Family requesting to speak with palliative medicine team prior to discharge to hospice.    Clinical Assessment and Goals of Care: I have reviewed medical records, discussed with care team, and met family at bedside including son Gerald Stabs), DIL Coralyn Mark), the patient's ex-wife, and two grandchildren. Chris requesting we speak in family conference room. Patient wakes to voice but remains drowsy and confused. Denies pain.   Introduced Palliative Medicine as specialized medical care for people living  with serious illness. It focuses on providing relief from the symptoms and stress of a serious illness. The goal is to improve quality of life for both the patient and the family.  We discussed a brief life review of the patient. 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 Two sons. Gerald Stabs tells me his father and mother divorced many years ago but have remained "close" and care for each other. Prior to hospitalization, patient living home with family. Gerald Stabs speaks of his father's alcohol abuse (drinking since age 31) and this contributing to his declining health.   Discussed hospital diagnoses, interventions, and underlying co-morbidities. Family has a good understanding of poor prognosis. Knowing this, they wish to focus on "comfort."   Discussed transition to comfort measures with focus on comfort, quality, and dignity at EOL. Discussed medications as needed for symptom management and ensure relief from suffering. Discussed hospice services. Family understands medications and interventions not aimed at comfort will be discontinued at hospice. Educated on EOL expectations.    The family lives in [place] and requesting [facility] facility for EOL care. The family speaks very highly of the facility and staff. Their biggest concern is if he is stable for transfer today. Vitals are stable and I explained that today would be appropriate to transfer him. They wish for him to be closer to home so they can  spend time together as a family.   Family very tearful. Spiritual individuals. "It's in God's hands."  Questions and concerns were addressed. Therapeutic listening as family shares stories of Mr. Lengacher. Emotional and spiritual support provided.     SUMMARY OF RECOMMENDATIONS    DNR/DNI  Comfort measures only. Discontinued medications/interventions not aimed at comfort.   Transfer to [facility] with hospice services. SW involved. Likely transfer today.   Code  Status/Advance Care Planning:  DNR  Symptom Management:   Has not required prn medications. Recommend prn morphine, ativan, and robinul at facility.   Palliative Prophylaxis:   Aspiration, Delirium Protocol, Frequent Pain Assessment, Oral Care and Turn Reposition  Additional Recommendations (Limitations, Scope, Preferences):  Full Comfort Care  Psycho-social/Spiritual:   Desire for further Chaplaincy support: yes  Additional Recommendations: Caregiving  Support/Resources and Education on Hospice  Prognosis:   < 2 weeks  Discharge Planning: [place] with Hospice      Primary Diagnoses: Present on Admission: . AKI (acute kidney injury) ([place])   I have reviewed the medical record, interviewed the patient and family, and examined the patient. The following aspects are pertinent.  Past Medical History:  Diagnosis Date  . Coronary artery disease   . Gout   . Hyperlipidemia   . Hypertension   . Neuropathy    Social History   Social History  . Marital status: Divorced    Spouse name: N/A  . Number of children: N/A  . Years of education: N/A   Social History Main Topics  . Smoking status: Never Smoker  . Smokeless tobacco: Current User    Types: Chew  . Alcohol use Yes     Comment: 2 40 0z daily  . Drug use: Unknown  . Sexual activity: Not Asked   Other Topics Concern  . None   Social History Narrative  . None   Family History  Problem Relation Age of Onset  . Heart disease Brother    Scheduled Meds:  Continuous Infusions:  PRN Meds:. Medications Prior to Admission:  Prior to Admission medications   Medication Sig Start Date End Date Taking? Authorizing Provider  allopurinol (ZYLOPRIM) 300 MG tablet Take 300 mg by mouth daily.   Yes [provider]  aspirin EC 81 MG tablet Take 81 mg by mouth daily.   Yes [provider]  cephALEXin (KEFLEX) 500 MG capsule Take 500 mg by mouth 3 (three) times daily.   Yes  [provider]  Cholecalciferol (VITAMIN D3) 2000 units TABS Take 1 tablet by mouth 2 (two) times daily.   Yes [provider]  Ferrous Sulfate (IRON) 325 (65 Fe) MG TABS Take 1 tablet by mouth 2 (two) times daily.    Yes [provider]  furosemide (LASIX) 20 MG tablet Take 20 mg by mouth daily.    Yes [provider]  metoprolol succinate (TOPROL-XL) 25 MG 24 hr tablet Take 12.5 mg by mouth every other day.    Yes [provider]  omeprazole (PRILOSEC) 20 MG capsule Take 20 mg by mouth daily.   Yes [provider]  potassium chloride SA (K-DUR,KLOR-CON) 20 MEQ tablet Take 20 mEq by mouth daily.   Yes [provider]  PREDNISONE PO Take 1 tablet by mouth daily.   Yes [provider]   No Known Allergies Review of Systems  Unable to perform ROS: Mental status change   Physical Exam  Constitutional: He is easily aroused. He appears ill.  HENT:  Head: Normocephalic and atraumatic.  Cardiovascular: Regular rhythm.   Pulmonary/Chest: No accessory muscle usage. No tachypnea. No respiratory distress. He has decreased breath sounds.  Abdominal: There is tenderness.  Neurological: He is easily aroused. He is disoriented.  Skin: Skin is warm and dry. There is pallor.  Psychiatric: His speech is delayed. Cognition and memory are impaired. He is inattentive.  Nursing note and vitals reviewed.  Vital Signs: BP 134/68   Pulse 69   Temp 97.7 F (36.5 C) (Oral)   Resp 10   Ht _0  (1.778 m)   Wt 70.9 kg (156 lb 4.9 oz)   SpO2 99%   BMI 22.43 kg/m  Pain Assessment: No/denies pain POSS *See Group Information*: 1-Acceptable,Awake and alert Pain Score: Asleep   SpO2: SpO2: 99 % O2 Device:SpO2: 99 % O2 Flow Rate: .O2 Flow Rate (L/min): 2 L/min  IO: Intake/output summary:   Intake/Output Summary (Last 24 hours) at 07/26/17 0901 Last data filed at 07/25/17 1109  Gross per 24 hour  Intake            157.5 ml  Output                 0 ml  Net            157.5 ml    LBM: Last BM Date: 07/24/17 Baseline Weight: Weight: 71.7 kg (158 lb) Most recent weight: Weight: 70.9 kg (156 lb 4.9 oz)     Palliative Assessment/Data: PPS 20%   Flowsheet Rows     Most Recent Value  Intake Tab  Referral Department  Hospitalist  Unit at Time of Referral  Intermediate Care Unit  Palliative Care Primary Diagnosis  Sepsis/Infectious Disease  Palliative Care Type  New Palliative care  Reason for referral  Clarify Goals of Care  Date first seen by Palliative Care  07/25/17  Clinical Assessment  Palliative Performance Scale Score  20%  Psychosocial & Spiritual Assessment  Palliative Care Outcomes  Patient/Family meeting held?  Yes  Who was at the meeting?  patient, multiple family members  Palliative Care Outcomes  Clarified goals of care, Provided end of life care assistance, Provided psychosocial or spiritual support, ACP counseling assistance, Transitioned to hospice, Changed to focus on comfort, Counseled regarding hospice, Improved pain interventions, Improved non-pain symptom therapy      Time In:1040 Time Out:1150  Time Total: 37mn Greater than 50%  of this time was spent counseling and coordinating care related to the above assessment and plan.  Signed by:  MIhor Dow FNP-C Palliative Medicine Team  Phone: [phone]: [phone]  Please contact Palliative Medicine Team phone at [phone] questions and concerns.  For individual provider: See AShea Evans

## 2017-07-25 NOTE — Discharge Summary (Signed)
Physician Discharge Summary  Jonathan Walker ZOX:[id number] DOB: 04/22/1946  PCP: Samuel JesterButler, Cynthia, DO  Admit date: 07/15/2017 Discharge date: 07/25/2017  Recommendations for Outpatient Follow-up:  1. M.D. at SNF regarding comfort measures with hospice at SNF. 2. Dr. Samuel Jesterynthia Butler, PCP, as needed.  Home Health: None Equipment/Devices: Patient is being discharged to SNF with indwelling Foley catheter for comfort and with PICC line (family wishes to keep at discharge)    Discharge Condition: Guarded and at risk for decline and even death.  CODE STATUS: DO NOT RESUSCITATE  Diet recommendation: Regular diet.  Discharge Diagnoses:  Active Problems:   AKI (acute kidney injury) (HCC)   Polyarteritis nodosa (HCC)   Normocytic anemia   Intra-abdominal abscess (HCC)   Acute encephalopathy   CAD (coronary artery disease)   Gout   Essential hypertension   Ulcers of both lower legs (HCC)   Hyperammonemia (HCC)   History of alcoholism (HCC)   Hypernatremia   Hypokalemia   Hypomagnesemia   Hyperphosphatemia   Brief Summary: [age] year old male with PMH of CAD status post CABG, HTN, HLD, gout, alcohol use, neuropathy, cirrhosis, polyarteritis nodosum, PUD, originally [facility] ED on 07/16/17 due to weakness and difficulty walking. He had recently been treated with a steroid taper and antibiotics for polyarteritis nodosum. Labs revealed acute kidney injury. Imaging studies including renal ultrasound 07/16/17 showed partially visualized complex fluid collection adjacent to the liver, unsure whether this represented fluid collection or complex ascites, CT abdomen and pelvis on 07/16/17 with oral contrast showed large perihepatic gas/fluid collection probably communicating with another fluid collection in the right lower quadrant. [facility]  for IR intervention and drainage of the abscess which was performed on 10/24 and 1.1 L of pus was drained  initially. Hospital course was complicated by acute kidney injury, acute GI bleed, acute blood loss anemia. He was determined to be gravely ill. Goals of care discussions were had with patient and family who finally decided to transition him to full comfort care. Palliative care consulted and agree. Patient will be discharged to SNF for comfort measures with hospice.  Assessment and plan:  Intra-abdominal/perihepatic abscess - Drained by IR with >1 L of purulent drainage. Interventional radiology and general surgery continued to assist with management. Drain study by IR on 10/29 showed no fistula. - On day of discharge, this drain wasn't draining and all drainage was coming from around the drain. IR reassessed and recommended removing the drain and placing an ostomy type pouch over the site to collect the drainage. WOC RN evaluated and placed a drainable fecal ostomy pouch, however the drainage is very thin and may undermine pouch quickly. Eakin pouch was used in the past but didn't last very long either. - It was suspected that the abscess may have been from a perforated hollow viscus versus infected loculated ascites. - Abscess culture showed multiple organisms, none predominant. Blood cultures 2 negative. Antibiotics were discontinued.  - At this point, patient has been transitioned to full comfort care.  Acute upper GI bleed - GI was consulted and suspected that the bleeding is related to a perforation and hence upper GI bleed. As per family report, he had history of "bleeding ulcer" during possible upper endoscopy and colonoscopy within the last 12-18 [place]. - There was consideration given for EGD but subsequently given his overall clinical picture, goals of care were discussed and patient was transitioned to full comfort care.  Acute blood loss anemia  complicating chronic anemia related to inflammation, renal disease and slow GI blood loss. - Related to acute upper GI bleed. - He  was transfused PRBCs and then subsequently no further transfusions due to comfort care.  Alcohol related cirrhosis - As per GI, this is a new diagnosis and it is not clear if the pelvic fluid seen his portal hypertensive ascites or "likely" purulent drainage of the suspected perforation. - Hepatitis B surface antigen and HCV antibodies both negative.  Severe protein calorie malnutrition  Polyarteritis nodosa Recently diagnosed as outpatient and was treated with antibiotics and steroid taper.  Acute kidney injury - Nephrology was consulted and continued to see the patient until he was transitioned to full comfort care. - Baseline creatinine 0.9 on 01/30/2017. - Acute kidney injury likely multifactorial due to ATN from dehydration/overdiuresis, cannot rule out autoimmune disorder and in the setting of ongoing acute infection/abdominal abscess. - Renal results were as above. He underwent extensive evaluation for cause. - He was treated with IV fluids and creatinine was gradually improving. Now transitioned to complete comfort care. Nephrology signed off.  Hypokalemia/hypomagnesemia Replaced as needed  Essential hypertension  Alcohol abuse - Family reported that he drank heavily.  Acute encephalopathy/hyperammonemia Suspected due to infectious etiology. CT head showed no acute findings. Improved.  CAD  Hypernatremia Resolved  History of gout  Right leg ulcers - Management per wound care team recommendations.    Consultations:  Nephrology  General surgery  Interventional radiology  Palliative care team  Procedures:  CT-guided percutaneous abdominal drain-removed on day of discharge  Drain injection that showed no fistula.  Foley catheter  PICC line  Rectal tube-discontinued at discharge   Discharge Instructions  Discharge Instructions    Call MD for:  difficulty breathing, headache or visual disturbances    Complete by:  As directed    Call MD for:   extreme fatigue    Complete by:  As directed    Call MD for:  persistant dizziness or light-headedness    Complete by:  As directed    Call MD for:  persistant nausea and vomiting    Complete by:  As directed    Call MD for:  severe uncontrolled pain    Complete by:  As directed    Call MD for:  temperature >100.4    Complete by:  As directed    Diet general    Complete by:  As directed    Increase activity slowly    Complete by:  As directed        Medication List    STOP taking these medications   allopurinol 300 MG tablet Commonly known as:  ZYLOPRIM   aspirin EC 81 MG tablet   cephALEXin 500 MG capsule Commonly known as:  KEFLEX   furosemide 20 MG tablet Commonly known as:  LASIX   Iron 325 (65 Fe) MG Tabs   metoprolol succinate 25 MG 24 hr tablet Commonly known as:  TOPROL-XL   omeprazole 20 MG capsule Commonly known as:  PRILOSEC   potassium chloride SA 20 MEQ tablet Commonly known as:  K-DUR,KLOR-CON   PREDNISONE PO   Vitamin D3 2000 units Tabs     TAKE these medications   collagenase ointment Commonly known as:  SANTYL Apply topically daily. Apply to RLE medial and posterior wounds daily.   LORazepam 2 MG/ML concentrated solution Commonly known as:  ATIVAN Take 0.5 mLs (1 mg total) by mouth every 6 (six) hours as needed for anxiety.  morphine CONCENTRATE 10 mg / 0.5 ml concentrated solution Take 0.25 mLs (5 mg total) by mouth every 4 (four) hours as needed for moderate pain, severe pain or shortness of breath.   sodium chloride flush 0.9 % Soln Commonly known as:  NS 10-40 mLs by Intracatheter route every 12 (twelve) hours.      Follow-up Information    MD at SNF. Schedule an appointment as soon as possible for a visit.   Why:  Follow up regarding comfort measures with hospice at facility.       Samuel Jester, DO Follow up.   Why:  PRN. Contact information: [address] [place] [address] [phone]          No Known  Allergies    Procedures/Studies: Ct Abdomen Pelvis Wo Contrast  Result Date: 07/18/2017 CLINICAL DATA:  71 year old male with recent drainage of right abdominal abscess. Follow-up study. EXAM: CT ABDOMEN AND PELVIS WITHOUT CONTRAST TECHNIQUE: Multidetector CT imaging of the abdomen and pelvis was performed following the standard protocol without IV contrast. COMPARISON:  Abdominal CT dated 07/16/2017 and 07/18/2017 FINDINGS: Evaluation of this exam is limited in the absence of intravenous contrast. Evaluation is also limited due to respiratory motion artifact as well as streak artifact caused by patient's arms. Lower chest: Partially visualized small right pleural effusion with associated mild compressive atelectasis of the right lung base. Left lung base linear atelectasis/ scarring. There is mild cardiomegaly. Multi vessel coronary vascular calcification as well as calcification of the mitral annulus. There is hypoattenuation of the cardiac blood pool suggestive of a degree of anemia. Clinical correlation is recommended. Hepatobiliary: There is mild surface nodularity of the liver concerning for early changes of cirrhosis. Clinical correlation is recommended. No intrahepatic biliary ductal dilatation. The gallbladder is distended. There is a 1.8 cm stone within the gallbladder. No pericholecystic fluid. Pancreas: Unremarkable. No pancreatic ductal dilatation or surrounding inflammatory changes. Spleen: Normal in size without focal abnormality. Adrenals/Urinary Tract: The adrenal glands are unremarkable. There is no hydronephrosis or nephrolithiasis on either side. The visualized ureters appear grossly unremarkable. The urinary bladder is decompressed around a Foley catheter. There is apparent diffuse thickening of the bladder wall which may be partly related to underdistention. Cystitis is not excluded. Correlation with urinalysis recommended. Stomach/Bowel: There is extensive sigmoid diverticulosis  without active inflammatory changes. There is no evidence of bowel obstruction or active inflammation. The appendix is normal. 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 The abdominal aorta and IVC are otherwise grossly unremarkable on this noncontrast study. No portal venous gas identified. There is no adenopathy. Reproductive: The prostate and seminal vesicles are grossly unremarkable. Other: There has been significant interval decrease in the site of the perihepatic fluid collection compared to the prior studies. A right subcostal approach drainage catheter is seen with tip along the lateral capsule of the right lobe of the liver. Minimal amount of residual fluid and air remains. Small amount of free fluid is also seen within the pelvis. Musculoskeletal: There is diffuse subcutaneous edema and anasarca. No drainable fluid collection. Degenerative changes of the spine and median sternotomy wires. No acute osseous pathology. IMPRESSION: 1. Significant interval resolution of the previously seen perihepatic fluid collection status post drainage catheter placement. The tip of the drainage catheter is positioned along the lateral aspect of the right lobe of the liver superiorly. Very small amount of residual fluid and air remains. 2. Irregular hepatic contour likely changes of cirrhosis. 3. Extensive sigmoid diverticulosis.  No active inflammation. 4. Gallstone. Electronically Signed   By: Elgie Collard M.D.   On: 07/18/2017 20:28   Ct Abdomen Pelvis Wo Contrast  Result Date: 07/16/2017 CLINICAL DATA:  Hematuria of unknown cause EXAM: CT ABDOMEN AND PELVIS WITHOUT CONTRAST TECHNIQUE: Multidetector CT imaging of the abdomen and pelvis was performed following the standard protocol without IV contrast. COMPARISON:  07/16/2017 FINDINGS: Lower chest: Lung bases demonstrate mild dependent atelectasis. Small right-sided pleural effusion. Coronary calcifications. Heart size upper limits  of normal. Hepatobiliary: Nodular liver contour suspect for cirrhosis. Calcified gallstone. No biliary dilatation. Pancreas: Unremarkable. No pancreatic ductal dilatation or surrounding inflammatory changes. Spleen: Normal in size without focal abnormality. Probable accessory splenule posterior to the spleen. Adrenals/Urinary Tract: Adrenal glands are within normal limits. No hydronephrosis. Urinary bladder slightly thick walled but underdistended Stomach/Bowel: Stomach nonenlarged. No dilated small bowel. No colon wall thickening. Normal appendix. Sigmoid colon diverticular disease without acute inflammation. Vascular/Lymphatic: Aortic atherosclerosis. No aneurysmal dilatation. No significantly enlarged abdominal or pelvic lymph nodes. Reproductive: Prostate is unremarkable. Other: Small amount of ascites in the pelvis. Large Terry hepatic gas and fluid collection measuring 16 cm AP by 3.6 cm thick by 21.6 cm cranial caudad. It is probably contiguous with an additional gas and fluid collection in the right lower quadrant measuring 11 cm. No significant rim enhancement. Some mass effect on the adjacent liver. No free air is seen. Musculoskeletal: Scoliosis and degenerative changes of the spine. Post sternotomy changes. No acute or suspicious bone lesion. IMPRESSION: 1. Large perihepatic gas and fluid collection that probably communicates with an additional large gas and fluid collection in the right lower quadrant of the abdomen. Differential considerations include loculated ascites with superimposed gas-forming infection, possible contained perforation although no diseased segments of large or small bowel are seen, and given the somewhat subcapsular appearance superiorly, old hematoma, possibly with infection. 2. Nodular contour of the liver suggests cirrhosis 3. Small moderate pelvic ascites 4. Sigmoid colon diverticular disease without acute inflammation 5. Gallstone Electronically Signed   By: Jasmine Pang M.D.    On: 07/16/2017 20:02   Dg Chest 2 View  Result Date: 07/21/2017 CLINICAL DATA:  Pleural effusion. History of coronary artery disease, hypertension. EXAM: CHEST  2 VIEW COMPARISON:  Chest x-rays dated 07/18/2017, 07/15/2017 and 12/16/2012. FINDINGS: Small right pleural effusion. Persistent elevation of the right hemidiaphragm, likely with overlying atelectasis. Left lung appears clear. Heart size and mediastinal contours are stable. Large hiatal hernia better demonstrated on earlier chest x-rays. Percutaneous drainage catheter underlies the right hemidiaphragm. IMPRESSION: Small right pleural effusion, with probable adjacent atelectasis. Electronically Signed   By: Bary Richard M.D.   On: 07/21/2017 18:29   Dg Chest 2 View  Result Date: 07/15/2017 CLINICAL DATA:  Subacute onset of generalized weakness and hypotension. Initial encounter. EXAM: CHEST  2 VIEW COMPARISON:  Chest radiograph performed 12/16/2012 FINDINGS: There is mild elevation of the right hemidiaphragm. Mild bilateral atelectasis is noted. No significant pleural effusion or pneumothorax is seen. The heart is mildly enlarged. The patient is status post median sternotomy. A large hiatal hernia is noted. No acute osseous abnormalities are seen. IMPRESSION: 1. Mild elevation of the right hemidiaphragm. Mild bibasilar atelectasis. 2. Mild cardiomegaly. 3. Large hiatal hernia. Electronically Signed   By: Roanna Raider M.D.   On: 07/15/2017 21:42   Ct Head Wo Contrast  Result Date: 07/18/2017 CLINICAL DATA:  Altered level of consciousness, unexplained. EXAM: CT HEAD WITHOUT CONTRAST TECHNIQUE: Contiguous axial images were obtained from the  base of the skull through the vertex without intravenous contrast. COMPARISON:  09/08/2005 FINDINGS: Brain: No acute finding by CT. Generalized brain atrophy. Chronic small-vessel ischemic changes throughout the hemispheric white matter, basal ganglia and thalami I. No mass lesion, hemorrhage,  hydrocephalus or extra-axial collection. Vascular: There is atherosclerotic calcification of the major vessels at the base of the brain. Skull: Negative Sinuses/Orbits: Clear/normal Other: None IMPRESSION: No acute finding by CT. Atrophy and chronic small-vessel ischemic changes. Electronically Signed   By: Paulina Fusi M.D.   On: 07/18/2017 15:55   Ir Sinus/fist Tube Chk-non Gi  Result Date: 07/23/2017 INDICATION: 71 year old male with a history of right upper quadrant abscess. EXAM: IMAGE GUIDED EXISTING DRAIN INJECTION MEDICATIONS: NONE ANESTHESIA/SEDATION: NONE COMPLICATIONS: None PROCEDURE: Informed written consent was obtained from the patient after a thorough discussion of the procedural risks, benefits and alternatives. All questions were addressed. Maximal Sterile Barrier Technique was utilized including caps, mask, sterile gowns, sterile gloves, sterile drape, hand hygiene and skin antiseptic. A timeout was performed prior to the initiation of the procedure. Patient positioned supine position on the fluoroscopy table. The patient was prepped and draped in the usual sterile fashion. Scout images of the upper abdomen performed. Under fluoroscopy, the existing right upper quadrant drain was injected confirming no damage of the drain catheter, an the drain catheter positioned well within the right upper quadrant on the surface of the liver. Contrast accumulated on the surface of the liver and in the right pericolic gutter. No evidence of fistula to the GI system or to the biliary system. Copious sterile saline flush was then flushed through the catheter, which was attached to gravity drainage. FINDINGS: Contrast injection demonstrates functional drainage catheter of the right upper quadrant with no evidence of fistula to the biliary system or the GI system. IMPRESSION: Status post injection of existing right upper quadrant abscess drain, demonstrating functional abscess drain and no evidence of fistula to  the GI or biliary system. Signed, Yvone Neu. Loreta Ave, DO [facility] Electronically Signed   By: Gilmer Mor D.O.   On: 07/23/2017 15:09   US Renal  Result Date: 07/16/2017 CLINICAL DATA:  Acute kidney injury. EXAM: RENAL / URINARY TRACT ULTRASOUND COMPLETE COMPARISON:  10/05/2016 abdominal ultrasound report (images not available at the time of this dictation) FINDINGS: Right Kidney: Length: 11.3 cm. Echogenicity within normal limits. No hydronephrosis. 1.2 cm minimally complex cyst containing some low-level internal echoes in the upper pole, unchanged in size from that reported on the prior ultrasound. Left Kidney: Length: 10.9 cm. Echogenicity within normal limits. No hydronephrosis. 1.3 cm minimally complex cyst containing some low-level internal echoes in the interpolar kidney, unchanged in size from the cyst described on the prior ultrasound. Bladder: Appears normal for degree of bladder distention. Partially visualized complex fluid adjacent to the liver with internal motion, right pleural effusion, and shadowing gallstone. IMPRESSION: 1. No hydronephrosis. 2. Minimally complex bilateral renal cysts, unchanged in size from this reported on the prior ultrasound. 3. Partially visualized complex fluid adjacent to the liver. It is not clear whether this is within bowel or reflects complex ascites or a perihepatic fluid collection. Consider abdominal CT for further evaluation. 4. Right pleural effusion. 5. Cholelithiasis. Electronically Signed   By: Sebastian Ache M.D.   On: 07/16/2017 10:46   US Arterial Abi (screening Lower Extremity)  Result Date: 07/16/2017 CLINICAL DATA:  71 year old male with lower extremity ulceration and edema. EXAM: NONINVASIVE PHYSIOLOGIC VASCULAR STUDY OF BILATERAL LOWER  EXTREMITIES TECHNIQUE: Evaluation of both lower extremities were performed at rest, including calculation of ankle-brachial indices with single level  Doppler, pressure and pulse volume recording. COMPARISON:  None. FINDINGS: Right ABI:  1.2 Left ABI:  1.2 Right Lower Extremity:  Normal arterial waveforms at the ankle. Left Lower Extremity:  Normal arterial waveforms at the ankle. IMPRESSION: Normal bilateral resting ankle-brachial indices and arterial waveforms. Electronically Signed   By: Malachy Moan M.D.   On: 07/16/2017 13:38   Dg Chest Port 1 View  Result Date: 07/22/2017 CLINICAL DATA:  Shortness of breath. History of coronary artery disease and hypertension. EXAM: PORTABLE CHEST 1 VIEW COMPARISON:  Chest x-rays dated 07/21/2017 and 07/15/2017. FINDINGS: Mild cardiomegaly is stable. Overall cardiomediastinal silhouette is stable in size and configuration. Atherosclerotic calcifications noted at the aortic arch. Small right pleural effusion and/or atelectasis is stable. Left lung remains clear. Percutaneous drainage catheter underlying the right hemidiaphragm. IMPRESSION: 1. Stable chest x-ray. Small right pleural effusion and/or atelectasis. No evidence of pneumonia or pulmonary edema. 2. Aortic atherosclerosis. Electronically Signed   By: Bary Richard M.D.   On: 07/22/2017 07:41   Dg Chest Port 1 View  Result Date: 07/18/2017 CLINICAL DATA:  Status post placement of a perihepatic drain for abscess today. EXAM: PORTABLE CHEST 1 VIEW COMPARISON:  CT abdomen and pelvis 07/16/2017 and images from drain placement today. PA and lateral chest 07/15/2017. FINDINGS: Drainage catheter is identified in the right upper quadrant of the abdomen. There is mild elevation the right hemidiaphragm relative to the left, unchanged. Lungs are clear. No pneumothorax or pleural effusion. Patient status post median sternotomy. Heart size is upper normal. IMPRESSION: No acute cardiopulmonary disease. Drainage catheter in the right upper quadrant of the abdomen. Electronically Signed   By: Drusilla Kanner M.D.   On: 07/18/2017 14:10   Ct Image Guided Drainage By  Percutaneous Catheter  Result Date: 07/18/2017 INDICATION: Large right intra-abdominal abscess EXAM: CT DRAINAGE RIGHT INTRA-ABDOMINAL ABSCESS MEDICATIONS: The patient is currently admitted to the hospital and receiving intravenous antibiotics. The antibiotics were administered within an appropriate time frame prior to the initiation of the procedure. ANESTHESIA/SEDATION: Moderate Sedation Time:  None. The patient was continuously monitored during the procedure by the interventional radiology nurse under my direct supervision. COMPLICATIONS: None immediate. PROCEDURE: Informed written consent was obtained from the patient and his family after a thorough discussion of the procedural risks, benefits and alternatives. All questions were addressed. Maximal Sterile Barrier Technique was utilized including caps, mask, sterile gowns, sterile gloves, sterile drape, hand hygiene and skin antiseptic. A timeout was performed prior to the initiation of the procedure. Previous imaging reviewed. Patient positioned supine. Noncontrast localization CT performed to localize the right abdominal large abscess. Overlying skin marked in the right mid abdomen. Under sterile conditions and local anesthesia, an 18 gauge 10 cm access needle was advanced percutaneously into the large fluid collection. Needle position confirmed with CT. Amplatz guidewire inserted followed by tract dilatation to insert a 16 French abscess drain. Drain catheter position confirmed with CT. Syringe aspiration yielded 1100 cc purulent fluid compatible with abscess. Sample sent for Gram stain and culture. Catheter secured with a Prolene suture and connected to external gravity drainage bag. Patient tolerated the procedure well. No immediate complication. IMPRESSION: Successful CT-guided right abdominal abscess drain catheter insertion. Electronically Signed   By: Judie Petit.  Shick M.D.   On: 07/18/2017 12:52      Subjective: Patient reports that he feels "okay".  He denies pain or dyspnea.  As per ex spouse at bedside, patient was asking for chewing tobacco. Discussed in detail with patient's family including son, daughter-in-law and ex spouse whose primary goal at this time for patient is that of complete comfort.  Discharge Exam:  Vitals:   07/25/17 0250 07/25/17 0400 07/25/17 0734 07/25/17 1107  BP: (!) 149/73 (!) 152/81 123/60 134/68  Pulse:  77 67 69  Resp: 16  11 10   Temp: 97.9 F (36.6 C)  98.1 F (36.7 C) 97.7 F (36.5 C)  TempSrc: Oral  Oral Oral  SpO2: 99%  99% 99%  Weight:      Height:        General: Elderly male, moderately built and poorly nourished, lying comfortably supine in bed. Patient was interviewed and examined with his nurses and family in the room. He does not appear to be any distress. Cardiovascular: S1 and S2 heard, RRR. No JVD, murmurs. Trace bilateral leg edema. Telemetry: Sinus rhythm. Respiratory: Slightly diminished breath sounds in the bases but no wheezing, rhonchi or crackles. Rest of lung fields clear to auscultation. No increased work of breathing. Abdominal:  Non distended, non tender & soft. No organomegaly or masses appreciated. Normal bowel sounds heard. Right side of abdomen: Site of percutaneous drain now covered with dressing which is clean and dry. CNS: Alert and oriented 2. No focal deficits. Extremities: no edema, no cyanosis Psychiatry: Pleasant and appropriate. Flat affect.    The results of significant diagnostics from this hospitalization (including imaging, microbiology, ancillary and laboratory) are listed below for reference.     Microbiology: Recent Results (from the past 240 hour(s))  Aerobic/Anaerobic Culture (surgical/deep wound)     Status: Abnormal   Collection Time: 07/18/17 11:42 AM  Result Value Ref Range Status   Specimen Description ABDOMEN  Final   Special Requests Normal  Final   Gram Stain   Final    ABUNDANT WBC PRESENT, PREDOMINANTLY PMN ABUNDANT GRAM NEGATIVE  RODS ABUNDANT GRAM POSITIVE COCCI MODERATE GRAM POSITIVE RODS    Culture (A)  Final    MULTIPLE ORGANISMS PRESENT, NONE PREDOMINANT MIXED ANAEROBIC FLORA PRESENT.  CALL LAB IF FURTHER IID REQUIRED.    Report Status 07/21/2017 FINAL  Final  Culture, Urine     Status: Abnormal   Collection Time: 07/18/17 12:11 PM  Result Value Ref Range Status   Specimen Description URINE, RANDOM  Final   Special Requests NONE  Final   Culture MULTIPLE SPECIES PRESENT, SUGGEST RECOLLECTION (A)  Final   Report Status 07/19/2017 FINAL  Final  Culture, blood (routine x 2)     Status: None   Collection Time: 07/18/17  1:30 PM  Result Value Ref Range Status   Specimen Description BLOOD LEFT HAND  Final   Special Requests IN PEDIATRIC BOTTLE Blood Culture adequate volume  Final   Culture NO GROWTH 5 DAYS  Final   Report Status 07/23/2017 FINAL  Final  Culture, blood (routine x 2)     Status: None   Collection Time: 07/18/17  1:30 PM  Result Value Ref Range Status   Specimen Description BLOOD RIGHT HAND  Final   Special Requests IN PEDIATRIC BOTTLE Blood Culture adequate volume  Final   Culture NO GROWTH 5 DAYS  Final   Report Status 07/23/2017 FINAL  Final  MRSA PCR Screening     Status: None   Collection Time: 07/18/17  7:10 PM  Result Value Ref Range Status   MRSA by PCR NEGATIVE NEGATIVE Final    Comment:  The GeneXpert MRSA Assay (FDA approved for NASAL specimens only), is one component of a comprehensive MRSA colonization surveillance program. It is not intended to diagnose MRSA infection nor to guide or monitor treatment for MRSA infections.   Culture, Urine     Status: None   Collection Time: 07/19/17  1:20 PM  Result Value Ref Range Status   Specimen Description URINE, CATHETERIZED  Final   Special Requests NONE  Final   Culture NO GROWTH  Final   Report Status 07/20/2017 FINAL  Final  Culture, Urine     Status: Abnormal   Collection Time: 07/20/17  1:05 PM  Result Value  Ref Range Status   Specimen Description URINE, CATHETERIZED  Final   Special Requests NONE  Final   Culture 30,000 COLONIES/mL YEAST (A)  Final   Report Status 07/21/2017 FINAL  Final     Labs: CBC:  Recent Labs Lab 07/20/17 0400  07/21/17 0453 07/22/17 0327 07/22/17 2335 07/23/17 0903 07/24/17 0435  WBC 8.0  --  6.1 5.5  --  5.6 5.5  NEUTROABS 6.0  --  4.4 3.7  --  3.7 3.5  HGB 7.2*  < > 8.0* 9.2* 8.7* 8.5* 7.4*  HCT 21.4*  < > 23.9* 28.9* 27.3* 26.7* 23.1*  MCV 89.5  --  90.2 92.9  --  93.7 93.1  PLT 250  --  183 188  --  186 186  < > = values in this interval not displayed. Basic Metabolic Panel:  Recent Labs Lab 07/20/17 0400 07/20/17 1911 07/21/17 0453 07/22/17 0327 07/23/17 0559 07/24/17 0435  NA 147* 145 147*  146* 146*  146* 146* 142  K 2.9* 3.2* 2.9*  2.9* 3.5  3.4* 3.9 2.6*  CL 104 104 105  103 105  104 109 105  CO2 24 25 25  24 23  22 22 28   GLUCOSE 97 94 92  91 77  78 96 127*  BUN 90* 83* 79*  80* 75*  75* 68* 54*  CREATININE 4.25* 4.04* 4.08*  4.09* 4.05*  4.03* 3.81* 3.40*  CALCIUM 8.3* 8.3* 8.4*  8.3* 8.6*  8.5* 8.3* 8.1*  MG 1.6*  --  1.8 1.7 1.7 1.5*  PHOS 5.3*  --  5.2*  5.3* 4.9*  5.0* 4.6 4.1   Liver Function Tests:  Recent Labs Lab 07/20/17 0400 07/21/17 0453 07/22/17 0327 07/23/17 0559 07/24/17 0435  AST 17 15 15 19  14*  ALT 13* 12* 12* 10* 10*  ALKPHOS 59 52 53 48 41  BILITOT 1.4* 1.2 1.3* 1.5* 0.6  PROT 4.9* 4.9* 5.8* 5.3* 5.0*  ALBUMIN 1.4* 1.4*  1.3* 1.5*  1.5* 1.4* 1.2*   BNP (last 3 results)  Recent Labs  07/15/17 1834  BNP 418.0*   CBG:  Recent Labs Lab 07/20/17 1135  GLUCAP 102*   Urinalysis    Component Value Date/Time   COLORURINE YELLOW 07/18/2017 1246   APPEARANCEUR HAZY (A) 07/18/2017 1246   LABSPEC 1.011 07/18/2017 1246   PHURINE 5.0 07/18/2017 1246   GLUCOSEU NEGATIVE 07/18/2017 1246   HGBUR LARGE (A) 07/18/2017 1246   BILIRUBINUR NEGATIVE 07/18/2017 1246   KETONESUR 5 (A)  07/18/2017 1246   PROTEINUR NEGATIVE 07/18/2017 1246   NITRITE NEGATIVE 07/18/2017 1246   LEUKOCYTESUR TRACE (A) 07/18/2017 1246    I discussed in detail with patient's son, daughter-in-law and ex spouse in the presence of patient's 2 RNs. Updated all care and answered questions. I also discussed with IR and palliative care  team.  Time coordinating discharge: Over 30 minutes  SIGNED:  Marcellus Scott, MD, FACP, FHM. [facility] Pager [phone]  If 7PM-7AM, please contact night-coverage [email or web address] Password [email or web address] 07/25/2017, 12:47 PM

## 2017-07-25 NOTE — Progress Notes (Signed)
Patient was just getting some sleep. Ex wife was in the room sitting on the chair. She requested we don't wake him up. W=Ex wife shared a lot about their good times and bad times together. Chaplain provided reflective listening and compassionate presence and prayer.   Chaplain Donterrius Santucci.

## 2017-07-25 NOTE — Progress Notes (Signed)
Noted in the chart pt transitioned to comfort measures in the setting of recurrent severe bleeding.    We will sign off.  Please let us know if we can be of any assistance.    Bufford ButtnerElizabeth Drisana Schweickert MD [facility] pgr [phone]

## 2017-07-26 DIAGNOSIS — Z7189 Other specified counseling: Secondary | ICD-10-CM

## 2017-07-26 DIAGNOSIS — Z515 Encounter for palliative care: Secondary | ICD-10-CM

## 2017-08-25 DEATH — deceased

## 2019-01-14 IMAGING — DX DG CHEST 2V
2 series · 2 of 2 positions shown · non-contrast
Comparison: Chest radiograph performed 12/16/2012

CLINICAL DATA: Subacute onset of generalized weakness and
hypotension. Initial encounter.

EXAM:
CHEST  2 VIEW

[chest lat]
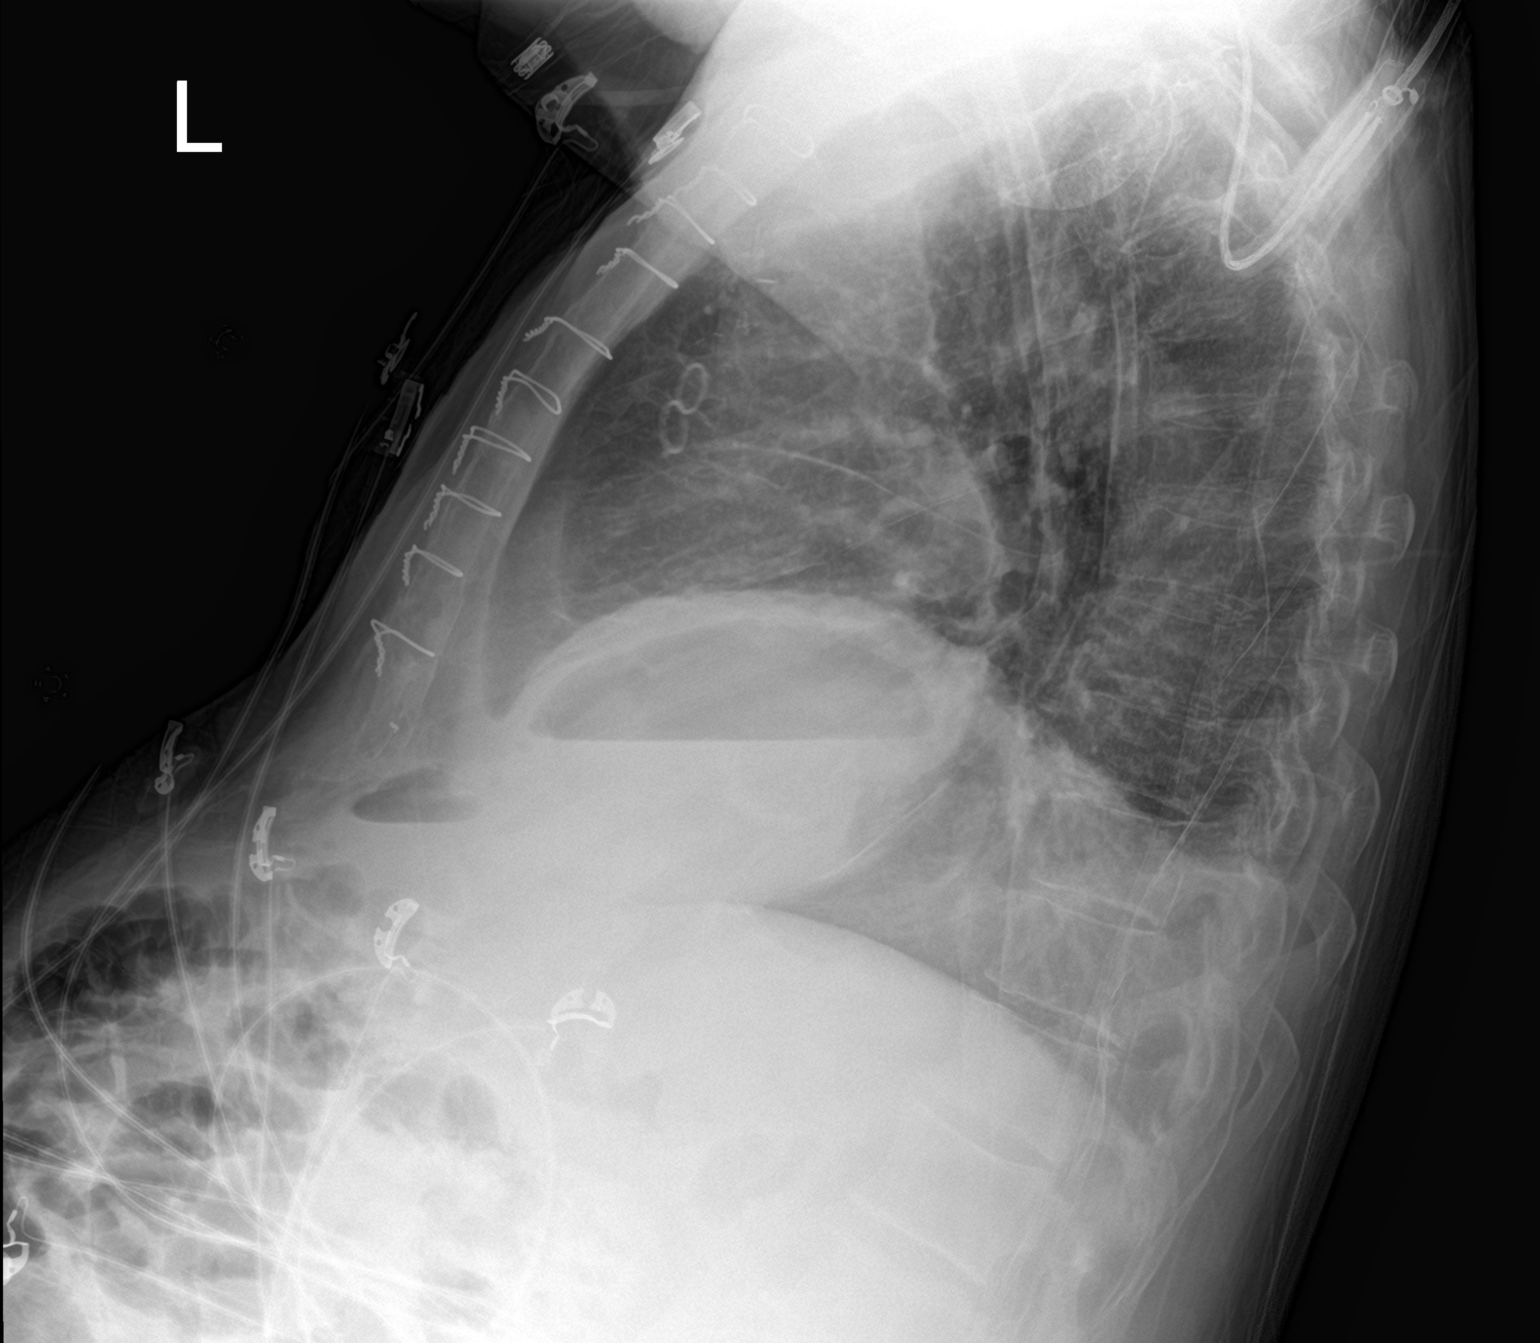

[chest ap]
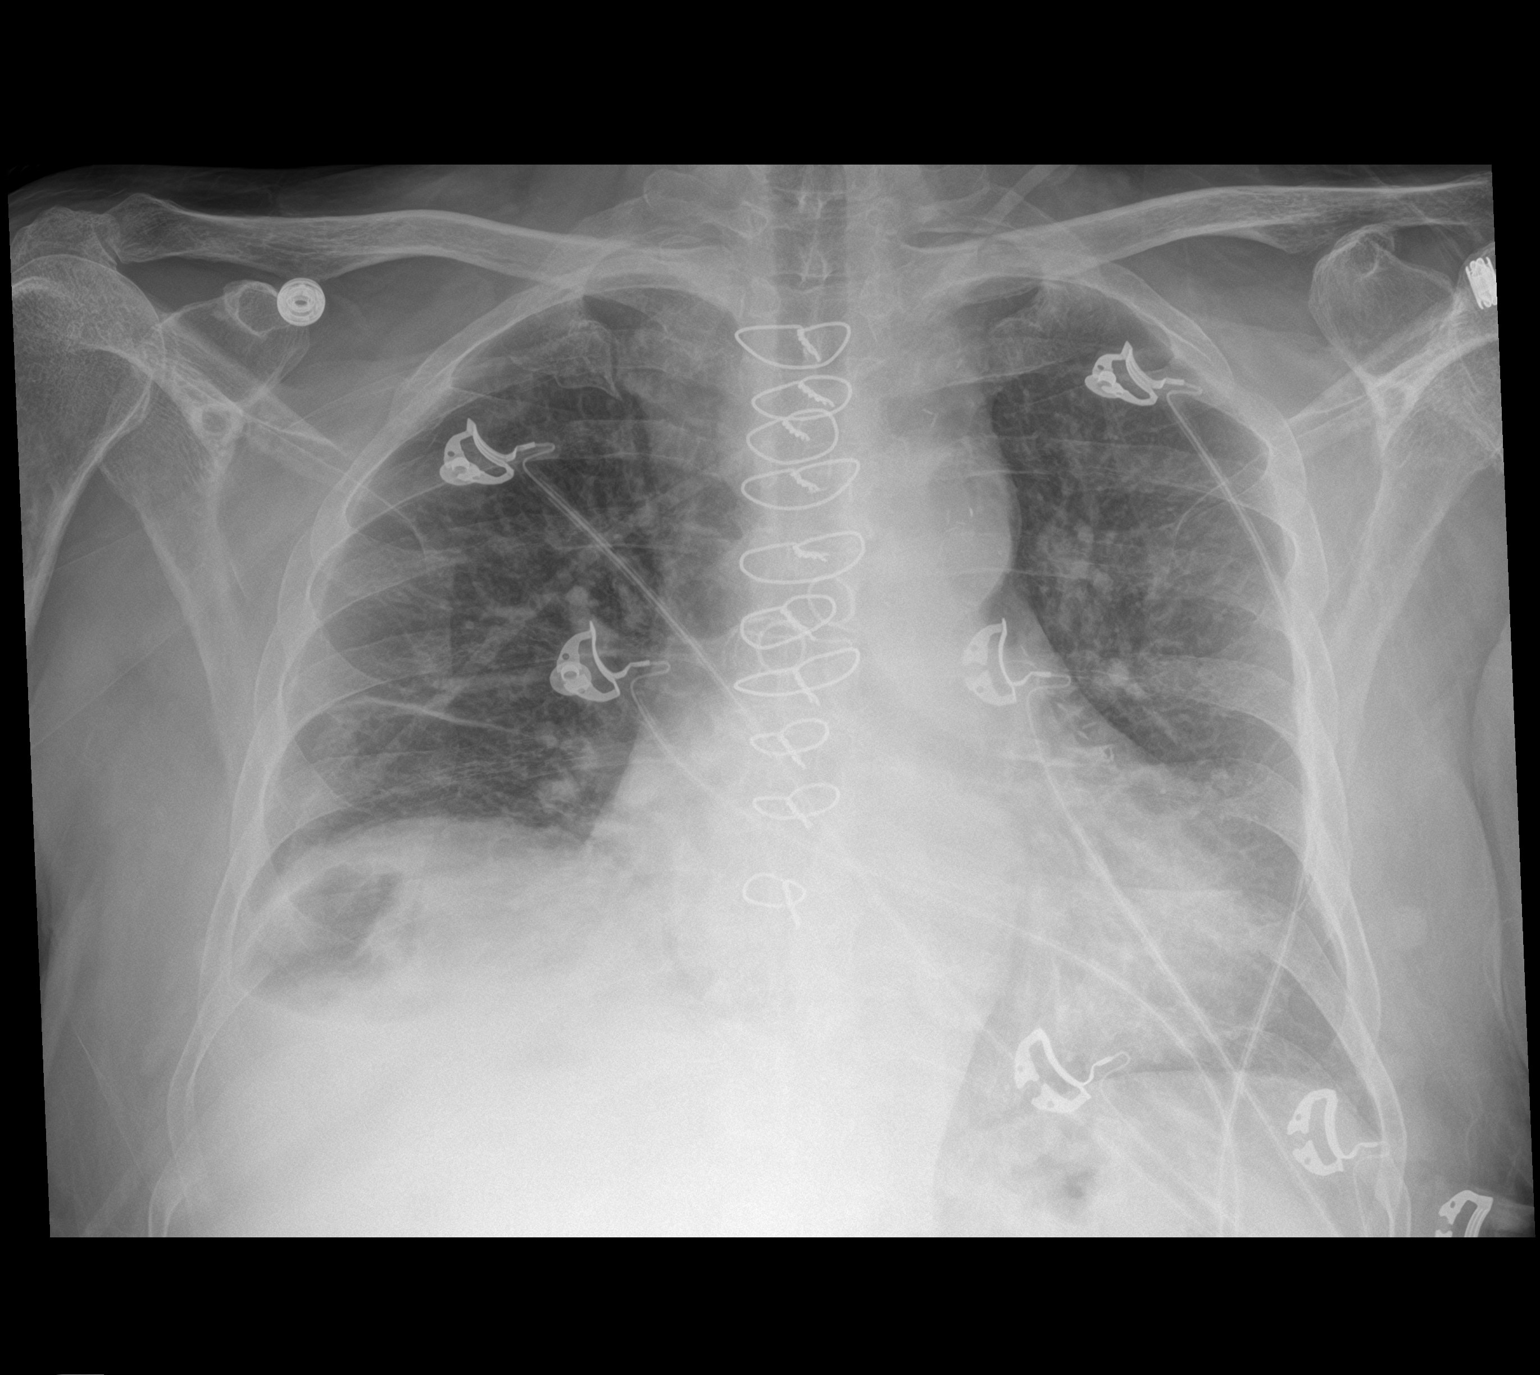

[2 of 2 positions shown; findings below may reference images not displayed]

FINDINGS: There is mild elevation of the right hemidiaphragm. Mild bilateral
atelectasis is noted. No significant pleural effusion or
pneumothorax is seen.

The heart is mildly enlarged. The patient is status post median
sternotomy. A large hiatal hernia is noted. No acute osseous
abnormalities are seen.
IMPRESSION: 1. Mild elevation of the right hemidiaphragm. Mild bibasilar
atelectasis.
2. Mild cardiomegaly.
3. Large hiatal hernia.

## 2019-01-15 IMAGING — CT CT ABD-PELV W/O CM
2 of 4 series · 16 of 46 positions shown, 18 images · non-contrast
Comparison: 07/16/2017

CLINICAL DATA: Hematuria of unknown cause

EXAM:
CT ABDOMEN AND PELVIS WITHOUT CONTRAST
TECHNIQUE: Multidetector CT imaging of the abdomen and pelvis was performed
following the standard protocol without IV contrast.

[Series 2: axial st · axial · 0.80mm/px · z∈[-648,-248]mm · 13 of 88 slices shown, 15 images]
[im 4/88  soft-tissue]
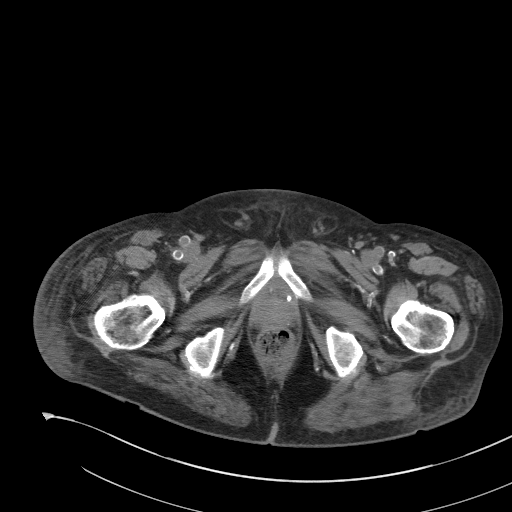
[im 4/88  bone]
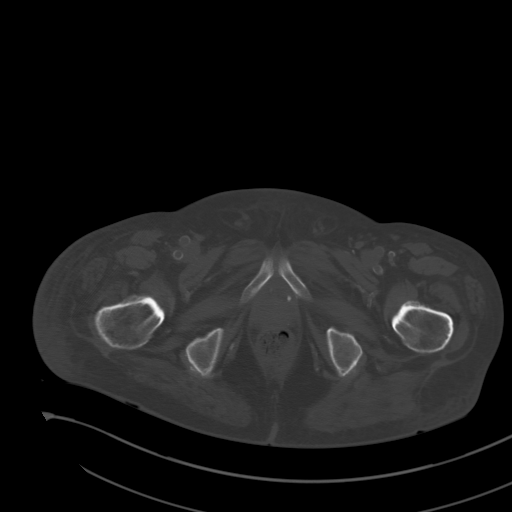
[im 11/88  soft-tissue]
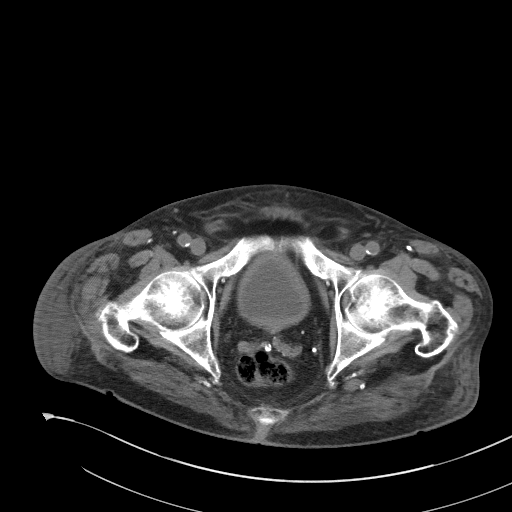
[im 18/88  soft-tissue]
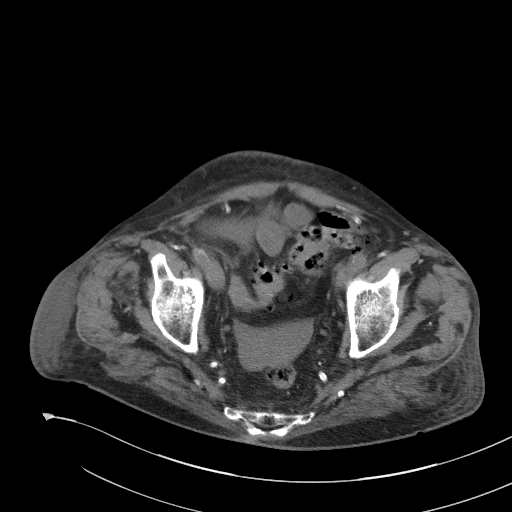
[im 25/88  soft-tissue]
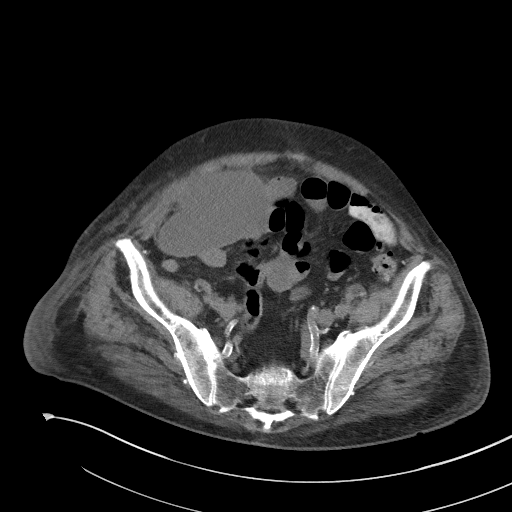
[im 32/88  soft-tissue]
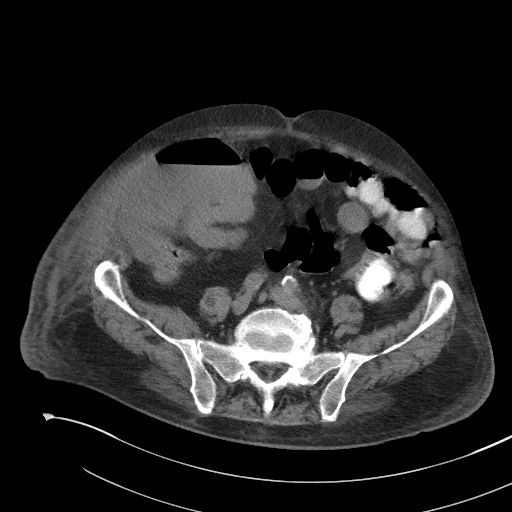
[im 39/88  soft-tissue]
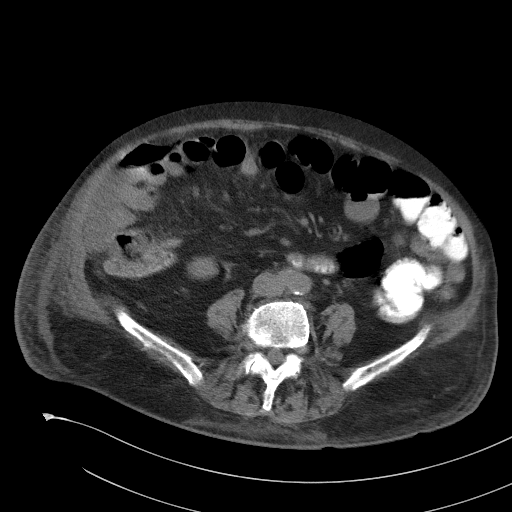
[im 46/88  soft-tissue]
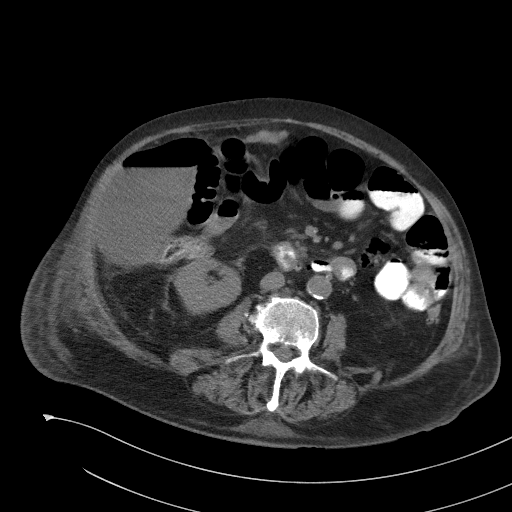
[im 49/88  soft-tissue]
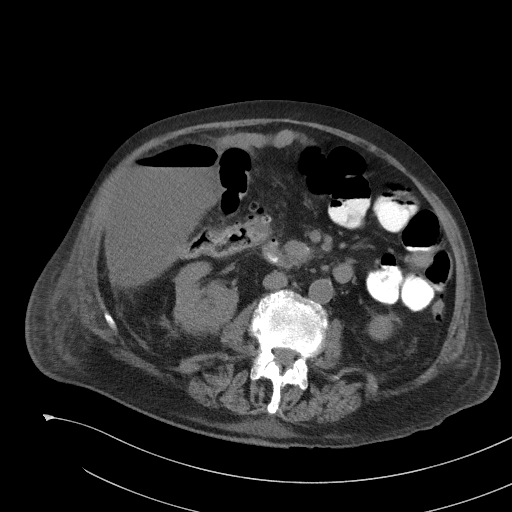
[im 56/88  soft-tissue]
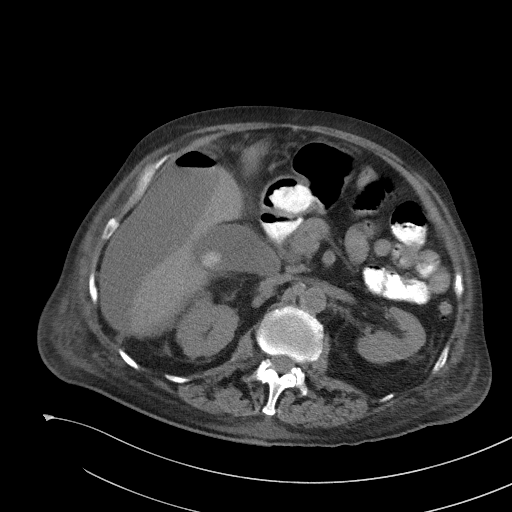
[im 56/88  bone]
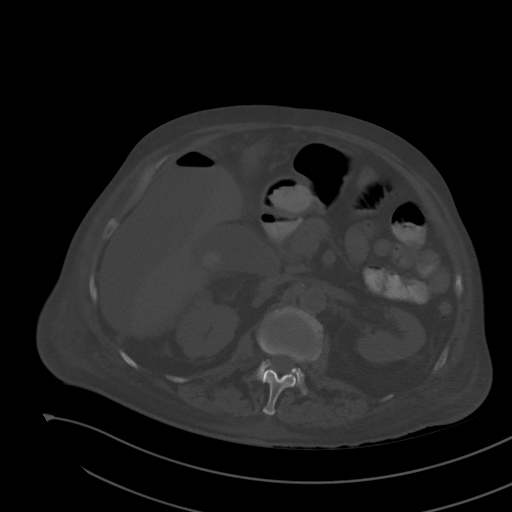
[im 63/88  soft-tissue]
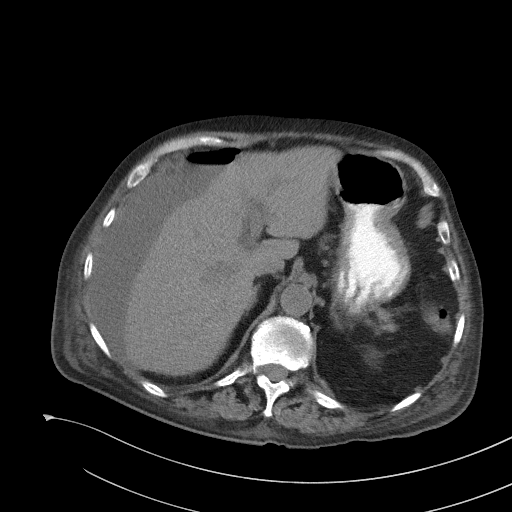
[im 70/88  soft-tissue]
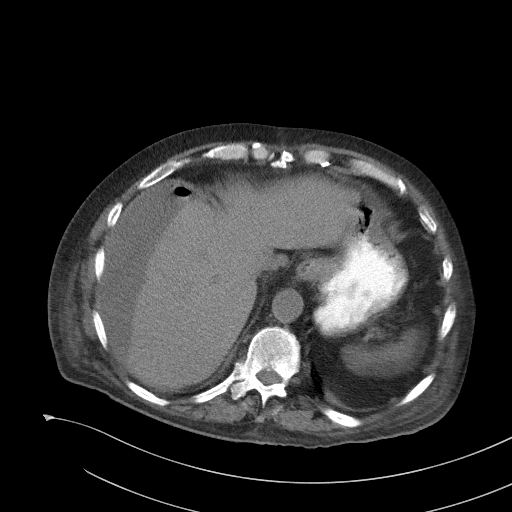
[im 77/88  soft-tissue]
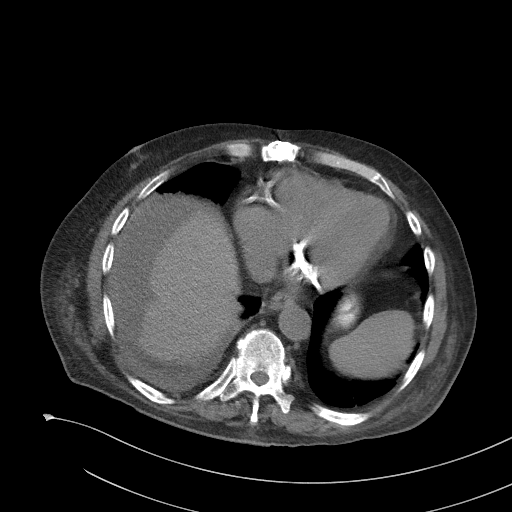
[im 84/88  soft-tissue]
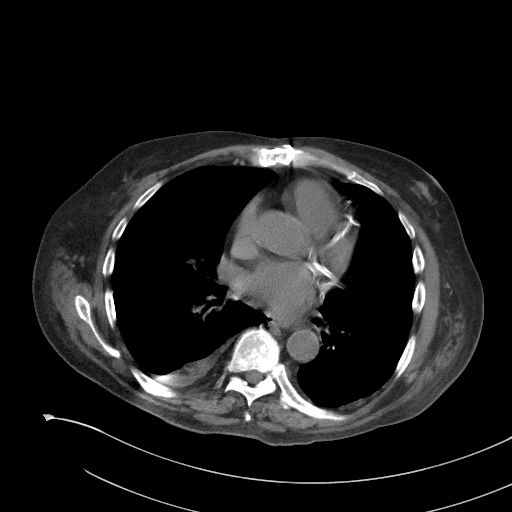

[Series 5: coronal st · coronal · 0.77mm/px · 3 of 92 slices shown]
[im 31/92  soft-tissue]
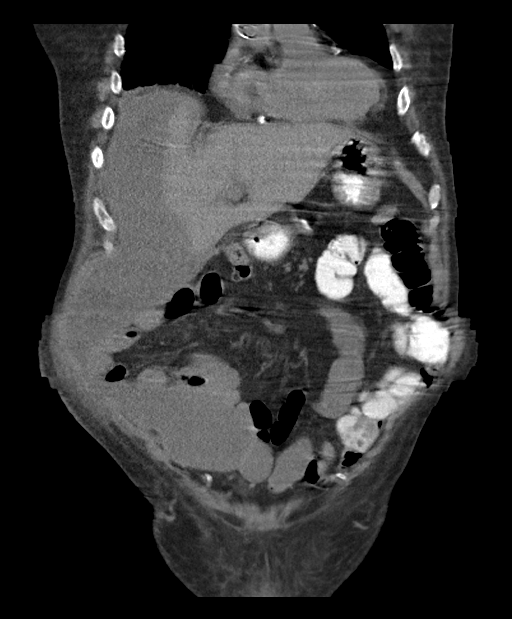
[im 41/92  soft-tissue]
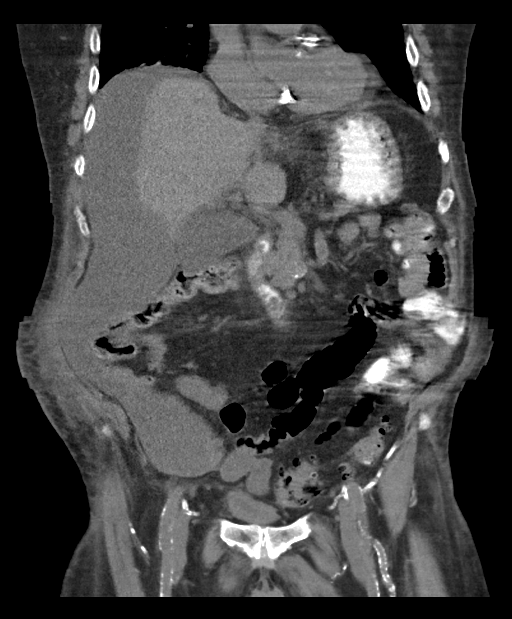
[im 51/92  soft-tissue]
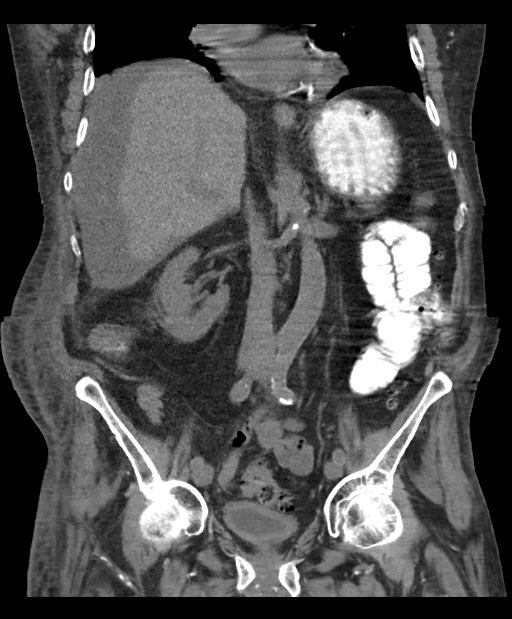

[16 of 46 positions shown; findings below may reference images not displayed]

FINDINGS: Lower chest: Lung bases demonstrate mild dependent atelectasis.
Small right-sided pleural effusion. Coronary calcifications. Heart
size upper limits of normal.

Hepatobiliary: Nodular liver contour suspect for cirrhosis.
Calcified gallstone. No biliary dilatation.

Pancreas: Unremarkable. No pancreatic ductal dilatation or
surrounding inflammatory changes.

Spleen: Normal in size without focal abnormality. Probable accessory
splenule posterior to the spleen.

Adrenals/Urinary Tract: Adrenal glands are within normal limits. No
hydronephrosis. Urinary bladder slightly thick walled but
underdistended

Stomach/Bowel: Stomach nonenlarged. No dilated small bowel. No colon
wall thickening. Normal appendix. Sigmoid colon diverticular disease
without acute inflammation.

Vascular/Lymphatic: Aortic atherosclerosis. No aneurysmal
dilatation. No significantly enlarged abdominal or pelvic lymph
nodes.

Reproductive: Prostate is unremarkable.

Other: Small amount of ascites in the pelvis. Large Jasim hepatic
gas and fluid collection measuring 16 cm AP by 3.6 cm thick by
cm cranial caudad. It is probably contiguous with an additional gas
and fluid collection in the right lower quadrant measuring 11 cm. No
significant rim enhancement. Some mass effect on the adjacent liver.
No free air is seen.

Musculoskeletal: Scoliosis and degenerative changes of the spine.
Post sternotomy changes. No acute or suspicious bone lesion.
IMPRESSION: 1. Large perihepatic gas and fluid collection that probably
communicates with an additional large gas and fluid collection in
the right lower quadrant of the abdomen. Differential considerations
include loculated ascites with superimposed gas-forming infection,
possible contained perforation although no diseased segments of
large or small bowel are seen, and given the somewhat subcapsular
appearance superiorly, old hematoma, possibly with infection.
2. Nodular contour of the liver suggests cirrhosis
3. Small moderate pelvic ascites
4. Sigmoid colon diverticular disease without acute inflammation
5. Gallstone
